# Patient Record
Sex: Female | Born: 1950
Health system: Southern US, Community
[De-identification: ages and names within clinical notes are randomized; demographics above are authoritative.]

## PROBLEM LIST (undated history)

## (undated) DIAGNOSIS — I700 Atherosclerosis of aorta: Secondary | ICD-10-CM

## (undated) DIAGNOSIS — E559 Vitamin D deficiency, unspecified: Secondary | ICD-10-CM

## (undated) DIAGNOSIS — F329 Major depressive disorder, single episode, unspecified: Secondary | ICD-10-CM

## (undated) DIAGNOSIS — M1990 Unspecified osteoarthritis, unspecified site: Secondary | ICD-10-CM

## (undated) DIAGNOSIS — E079 Disorder of thyroid, unspecified: Secondary | ICD-10-CM

## (undated) DIAGNOSIS — E538 Deficiency of other specified B group vitamins: Secondary | ICD-10-CM

## (undated) DIAGNOSIS — F419 Anxiety disorder, unspecified: Secondary | ICD-10-CM

## (undated) DIAGNOSIS — M810 Age-related osteoporosis without current pathological fracture: Secondary | ICD-10-CM

## (undated) DIAGNOSIS — D649 Anemia, unspecified: Secondary | ICD-10-CM

## (undated) DIAGNOSIS — T7840XA Allergy, unspecified, initial encounter: Secondary | ICD-10-CM

## (undated) DIAGNOSIS — I10 Essential (primary) hypertension: Secondary | ICD-10-CM

## (undated) DIAGNOSIS — J439 Emphysema, unspecified: Secondary | ICD-10-CM

## (undated) DIAGNOSIS — K5790 Diverticulosis of intestine, part unspecified, without perforation or abscess without bleeding: Secondary | ICD-10-CM

## (undated) DIAGNOSIS — F1910 Other psychoactive substance abuse, uncomplicated: Secondary | ICD-10-CM

## (undated) DIAGNOSIS — F17200 Nicotine dependence, unspecified, uncomplicated: Secondary | ICD-10-CM

## (undated) DIAGNOSIS — I341 Nonrheumatic mitral (valve) prolapse: Secondary | ICD-10-CM

## (undated) DIAGNOSIS — E785 Hyperlipidemia, unspecified: Secondary | ICD-10-CM

## (undated) DIAGNOSIS — F32A Depression, unspecified: Secondary | ICD-10-CM

## (undated) DIAGNOSIS — I77810 Thoracic aortic ectasia: Secondary | ICD-10-CM

## (undated) DIAGNOSIS — B009 Herpesviral infection, unspecified: Secondary | ICD-10-CM

## (undated) HISTORY — DX: Anxiety disorder, unspecified: F41.9

## (undated) HISTORY — DX: Atherosclerosis of aorta: I70.0

## (undated) HISTORY — DX: Unspecified osteoarthritis, unspecified site: M19.90

## (undated) HISTORY — DX: Thoracic aortic ectasia: I77.810

## (undated) HISTORY — DX: Anemia, unspecified: D64.9

## (undated) HISTORY — DX: Nicotine dependence, unspecified, uncomplicated: F17.200

## (undated) HISTORY — DX: Nonrheumatic mitral (valve) prolapse: I34.1

## (undated) HISTORY — DX: Hyperlipidemia, unspecified: E78.5

## (undated) HISTORY — DX: Essential (primary) hypertension: I10

## (undated) HISTORY — DX: Age-related osteoporosis without current pathological fracture: M81.0

## (undated) HISTORY — DX: Deficiency of other specified B group vitamins: E53.8

## (undated) HISTORY — DX: Other psychoactive substance abuse, uncomplicated: F19.10

## (undated) HISTORY — DX: Herpesviral infection, unspecified: B00.9

## (undated) HISTORY — DX: Major depressive disorder, single episode, unspecified: F32.9

## (undated) HISTORY — DX: Allergy, unspecified, initial encounter: T78.40XA

## (undated) HISTORY — DX: Vitamin D deficiency, unspecified: E55.9

## (undated) HISTORY — PX: TONSILLECTOMY: SUR1361

## (undated) HISTORY — PX: COLONOSCOPY: SHX174

## (undated) HISTORY — DX: Emphysema, unspecified: J43.9

## (undated) HISTORY — PX: OTHER SURGICAL HISTORY: SHX169

## (undated) HISTORY — DX: Diverticulosis of intestine, part unspecified, without perforation or abscess without bleeding: K57.90

## (undated) HISTORY — DX: Disorder of thyroid, unspecified: E07.9

## (undated) HISTORY — DX: Depression, unspecified: F32.A

---

## 1998-12-21 HISTORY — PX: VARICOSE VEIN SURGERY: SHX832

## 2010-10-13 ENCOUNTER — Ambulatory Visit (HOSPITAL_COMMUNITY): Admission: RE | Admit: 2010-10-13 | Discharge: 2010-10-13 | Payer: Self-pay | Admitting: Family Medicine

## 2010-11-10 ENCOUNTER — Encounter: Admission: RE | Admit: 2010-11-10 | Discharge: 2010-11-10 | Payer: Self-pay | Admitting: Family Medicine

## 2011-07-06 ENCOUNTER — Other Ambulatory Visit: Payer: Self-pay | Admitting: Emergency Medicine

## 2011-07-06 DIAGNOSIS — R1032 Left lower quadrant pain: Secondary | ICD-10-CM

## 2011-07-10 ENCOUNTER — Ambulatory Visit
Admission: RE | Admit: 2011-07-10 | Discharge: 2011-07-10 | Disposition: A | Discharge status: Home / Self Care | Payer: BC Managed Care – PPO | Source: Ambulatory Visit | Attending: Emergency Medicine | Admitting: Emergency Medicine

## 2011-07-10 DIAGNOSIS — R1032 Left lower quadrant pain: Secondary | ICD-10-CM

## 2011-07-10 MED ORDER — IOHEXOL 300 MG/ML  SOLN
100.0000 mL | Freq: Once | INTRAMUSCULAR | Status: AC | PRN
  Administered 2011-07-10: 100 mL via INTRAVENOUS

## 2011-08-21 ENCOUNTER — Other Ambulatory Visit: Payer: Self-pay | Admitting: Emergency Medicine

## 2011-08-25 ENCOUNTER — Other Ambulatory Visit: Payer: Self-pay | Admitting: Emergency Medicine

## 2011-08-25 DIAGNOSIS — R911 Solitary pulmonary nodule: Secondary | ICD-10-CM

## 2011-08-28 ENCOUNTER — Ambulatory Visit (HOSPITAL_COMMUNITY): Admission: RE | Admit: 2011-08-28 | Payer: BC Managed Care – PPO | Source: Ambulatory Visit

## 2011-09-28 ENCOUNTER — Other Ambulatory Visit: Payer: Self-pay | Admitting: Family Medicine

## 2011-09-28 DIAGNOSIS — R911 Solitary pulmonary nodule: Secondary | ICD-10-CM

## 2011-09-29 ENCOUNTER — Other Ambulatory Visit: Payer: Self-pay | Admitting: Family Medicine

## 2011-09-29 DIAGNOSIS — R634 Abnormal weight loss: Secondary | ICD-10-CM

## 2011-09-29 DIAGNOSIS — R1900 Intra-abdominal and pelvic swelling, mass and lump, unspecified site: Secondary | ICD-10-CM

## 2011-10-01 ENCOUNTER — Other Ambulatory Visit (HOSPITAL_COMMUNITY): Payer: BC Managed Care – PPO

## 2011-10-09 ENCOUNTER — Ambulatory Visit
Admission: RE | Admit: 2011-10-09 | Discharge: 2011-10-09 | Disposition: A | Discharge status: Home / Self Care | Payer: BC Managed Care – PPO | Source: Ambulatory Visit | Attending: Family Medicine | Admitting: Family Medicine

## 2011-10-09 DIAGNOSIS — R1900 Intra-abdominal and pelvic swelling, mass and lump, unspecified site: Secondary | ICD-10-CM

## 2011-10-09 DIAGNOSIS — R634 Abnormal weight loss: Secondary | ICD-10-CM

## 2011-10-20 ENCOUNTER — Ambulatory Visit (HOSPITAL_COMMUNITY)
Admission: RE | Admit: 2011-10-20 | Discharge: 2011-10-20 | Disposition: A | Discharge status: Home / Self Care | Payer: BC Managed Care – PPO | Source: Ambulatory Visit | Attending: Family Medicine | Admitting: Family Medicine

## 2011-10-20 DIAGNOSIS — J984 Other disorders of lung: Secondary | ICD-10-CM | POA: Insufficient documentation

## 2011-10-20 DIAGNOSIS — R918 Other nonspecific abnormal finding of lung field: Secondary | ICD-10-CM | POA: Insufficient documentation

## 2011-10-20 DIAGNOSIS — R911 Solitary pulmonary nodule: Secondary | ICD-10-CM

## 2011-10-20 DIAGNOSIS — R109 Unspecified abdominal pain: Secondary | ICD-10-CM | POA: Insufficient documentation

## 2011-10-20 DIAGNOSIS — R634 Abnormal weight loss: Secondary | ICD-10-CM | POA: Insufficient documentation

## 2011-12-22 ENCOUNTER — Ambulatory Visit (INDEPENDENT_AMBULATORY_CARE_PROVIDER_SITE_OTHER): Payer: BC Managed Care – PPO

## 2011-12-22 DIAGNOSIS — J019 Acute sinusitis, unspecified: Secondary | ICD-10-CM

## 2011-12-22 DIAGNOSIS — J209 Acute bronchitis, unspecified: Secondary | ICD-10-CM

## 2011-12-22 DIAGNOSIS — F17200 Nicotine dependence, unspecified, uncomplicated: Secondary | ICD-10-CM

## 2012-02-16 ENCOUNTER — Other Ambulatory Visit: Payer: Self-pay | Admitting: Physician Assistant

## 2012-02-17 ENCOUNTER — Ambulatory Visit (INDEPENDENT_AMBULATORY_CARE_PROVIDER_SITE_OTHER): Payer: BC Managed Care – PPO | Admitting: Family Medicine

## 2012-02-17 DIAGNOSIS — R5381 Other malaise: Secondary | ICD-10-CM

## 2012-02-17 DIAGNOSIS — E039 Hypothyroidism, unspecified: Secondary | ICD-10-CM | POA: Insufficient documentation

## 2012-02-17 DIAGNOSIS — M2550 Pain in unspecified joint: Secondary | ICD-10-CM

## 2012-02-17 DIAGNOSIS — R5383 Other fatigue: Secondary | ICD-10-CM

## 2012-02-17 DIAGNOSIS — Z8719 Personal history of other diseases of the digestive system: Secondary | ICD-10-CM

## 2012-02-17 LAB — POCT CBC
Granulocyte percent: 52.3 %G (ref 37–80)
HCT, POC: 38.2 % (ref 37.7–47.9)
Hemoglobin: 12.3 g/dL (ref 12.2–16.2)
Lymph, poc: 1.8 (ref 0.6–3.4)
MCH, POC: 31.3 pg — AB (ref 27–31.2)
MCHC: 32.2 g/dL (ref 31.8–35.4)
MCV: 97.1 fL — AB (ref 80–97)
MID (cbc): 0.2 (ref 0–0.9)
MPV: 8.6 fL (ref 0–99.8)
POC Granulocyte: 2.2 (ref 2–6.9)
POC LYMPH PERCENT: 43.1 %L (ref 10–50)
POC MID %: 4.6 %M (ref 0–12)
Platelet Count, POC: 208 10*3/uL (ref 142–424)
RBC: 3.93 M/uL — AB (ref 4.04–5.48)
RDW, POC: 13.9 %
WBC: 4.2 10*3/uL — AB (ref 4.6–10.2)

## 2012-02-17 LAB — POCT SEDIMENTATION RATE: POCT SED RATE: 8 mm/hr (ref 0–22)

## 2012-02-17 NOTE — Progress Notes (Signed)
61 yo with malaise x 3 months.  Feels depressed.  Has been caring for sister since she had surgery, although sister is in assisted living.  Her sister has Parkinson's, dementia and now the knee replacement surgery.  Father and an aunt has Parkinson's. Sleeping well on Trazadone 25 mg qhs.(she cut back from 50 1 year ago), Paxil 20 daily, levothyroxine 50 mcg Joints are painful in hands, and bones ache. No fever, chills Works around children. No s/t, cough, urinary sx, abdominal sx  O:  NAD, alert with very fine, faint action tremor. HEENT:  Unremarkable Chest:  Clear Heart:  Reg, no murmur, no gallop Abd:  Soft, nontender, no masses or HSM Ext:  No obvious arthritic changes, good pulses  A:  Multiple somatic symptoms which could be explained by thyroid disease., polymyalgia, burn-out from caretaker role  P:  Will get some therapy in near future. Check sed rate, CMET, TSH, CBC

## 2012-02-18 LAB — TSH: TSH: 1.324 u[IU]/mL (ref 0.350–4.500)

## 2012-02-18 LAB — COMPREHENSIVE METABOLIC PANEL
ALT: 14 U/L (ref 0–35)
AST: 20 U/L (ref 0–37)
Albumin: 4.8 g/dL (ref 3.5–5.2)
Alkaline Phosphatase: 62 U/L (ref 39–117)
BUN: 15 mg/dL (ref 6–23)
CO2: 31 mEq/L (ref 19–32)
Calcium: 9.1 mg/dL (ref 8.4–10.5)
Chloride: 102 mEq/L (ref 96–112)
Creat: 0.63 mg/dL (ref 0.50–1.10)
Glucose, Bld: 85 mg/dL (ref 70–99)
Potassium: 4.1 mEq/L (ref 3.5–5.3)
Sodium: 139 mEq/L (ref 135–145)
Total Bilirubin: 0.7 mg/dL (ref 0.3–1.2)
Total Protein: 6.8 g/dL (ref 6.0–8.3)

## 2012-02-22 ENCOUNTER — Ambulatory Visit (INDEPENDENT_AMBULATORY_CARE_PROVIDER_SITE_OTHER): Payer: BC Managed Care – PPO | Admitting: Family Medicine

## 2012-02-22 VITALS — BP 117/73 | HR 66 | Temp 98.4°F | Resp 18 | Ht 66.0 in | Wt 112.8 lb

## 2012-02-22 DIAGNOSIS — Z0000 Encounter for general adult medical examination without abnormal findings: Secondary | ICD-10-CM

## 2012-02-22 DIAGNOSIS — R5381 Other malaise: Secondary | ICD-10-CM

## 2012-02-22 DIAGNOSIS — R5383 Other fatigue: Secondary | ICD-10-CM

## 2012-02-22 LAB — POCT URINALYSIS DIPSTICK
Bilirubin, UA: NEGATIVE
Blood, UA: NEGATIVE
Glucose, UA: NEGATIVE
Ketones, UA: NEGATIVE
Leukocytes, UA: NEGATIVE
Nitrite, UA: NEGATIVE
Protein, UA: NEGATIVE
Spec Grav, UA: 1.015
Urobilinogen, UA: 0.2
pH, UA: 5.5

## 2012-02-22 LAB — POCT UA - MICROSCOPIC ONLY
Bacteria, U Microscopic: NEGATIVE
Casts, Ur, LPF, POC: NEGATIVE
Crystals, Ur, HPF, POC: NEGATIVE
Mucus, UA: NEGATIVE
RBC, urine, microscopic: NEGATIVE
Yeast, UA: NEGATIVE

## 2012-02-22 LAB — LIPID PANEL
Cholesterol: 185 mg/dL (ref 0–200)
HDL: 80 mg/dL (ref >39)
LDL Cholesterol: 95 mg/dL (ref 0–99)
Total CHOL/HDL Ratio: 2.3 Ratio
Triglycerides: 48 mg/dL (ref <150)
VLDL: 10 mg/dL (ref 0–40)

## 2012-02-22 LAB — VITAMIN B12: Vitamin B-12: 241 pg/mL (ref 211–911)

## 2012-02-22 NOTE — Progress Notes (Signed)
[facility]:  Office Visit  Chief Complaint:  Chief Complaint  Patient presents with  . Annual Exam    HPI: Kristine Davidson is a 61 y.o. female who is here for an annual exam. She recently had labs done and has never had an abnormal pap. The labs were done for generalized fatigue. She is stressed because taking care of sister with Parkinsons. Lab work listed below done by Dr. Elbert Ewings were all normal.   Past Medical History  Diagnosis Date  . Depression   . Mitral valve prolapse   . Anemia   . Diverticular disease   . Thyroid disease    No past surgical history on file. History   Social History  . Marital Status: Divorced    Spouse Name: N/A    Number of Children: N/A  . Years of Education: N/A   Social History Main Topics  . Smoking status: Current Everyday Smoker -- 10.0 packs/day for 43 years    Types: Cigarettes  . Smokeless tobacco: None  . Alcohol Use: None  . Drug Use: None  . Sexually Active: None   Other Topics Concern  . None   Social History Narrative  . None   No family history on file. Allergies  Allergen Reactions  . Codeine Itching   Prior to Admission medications   Medication Sig Start Date End Date Taking? Authorizing Provider  levothyroxine (SYNTHROID, LEVOTHROID) 50 MCG tablet Take 50 mcg by mouth daily.   Yes Historical Provider, MD  PARoxetine (PAXIL) 20 MG tablet Take 20 mg by mouth every morning.   Yes Historical Provider, MD  traZODone (DESYREL) 50 MG tablet TAKE ONE TABLET BY MOUTH AT BEDTIME 02/16/12  Yes Ryan Dunn, PA-C  valACYclovir (VALTREX) 1000 MG tablet Take 1,000 mg by mouth once.   Yes Historical Provider, MD     ROS: The patient denies fevers, chills, night sweats, unintentional weight loss, chest pain, palpitations, wheezing, dyspnea on exertion, nausea, vomiting, abdominal pain, dysuria, hematuria, melena, numbness,  or tingling. + fatigue  All other systems have been reviewed and were otherwise negative with  the exception of those mentioned in the HPI and as above.    PHYSICAL EXAM: Filed Vitals:   02/22/12 1009  BP: 117/73  Pulse: 66  Temp: 98.4 F (36.9 C)  Resp: 18   Filed Vitals:   02/22/12 1009  Height: 5\' 6"  (1.676 m)  Weight: 112 lb 12.8 oz (51.166 kg)   Body mass index is 18.21 kg/(m^2).  General: Alert, no acute distress HEENT:  Normocephalic, atraumatic, oropharynx patent. TM normal. Cardiovascular:  Regular rate and rhythm, no rubs murmurs or gallops.  No Carotid bruits, radial pulse intact. No pedal edema.  Respiratory: Clear to auscultation bilaterally.  No wheezes, rales, or rhonchi.  No cyanosis, no use of accessory musculature GI: No organomegaly, abdomen is soft and non-tender, positive bowel sounds.  No masses. Skin: No rashes. Neurologic: Facial musculature symmetric. Psychiatric: Patient is appropriate throughout our interaction. Lymphatic: No cervical lymphadenopathy Musculoskeletal: Gait intact. Normal Pelvic and Breast Exam.  LABS: Results for orders placed in visit on 02/17/12  POCT CBC      Component Value Range   WBC 4.2 (*) 4.6 - 10.2 (K/uL)   Lymph, poc 1.8  0.6 - 3.4    POC LYMPH PERCENT 43.1  10 - 50 (%L)   MID (cbc) 0.2  0 - 0.9    POC MID % 4.6  0 - 12 (%M)  POC Granulocyte 2.2  2 - 6.9    Granulocyte percent 52.3  37 - 80 (%G)   RBC 3.93 (*) 4.04 - 5.48 (M/uL)   Hemoglobin 12.3  12.2 - 16.2 (g/dL)   HCT, POC 40.9  81.1 - 47.9 (%)   MCV 97.1 (*) 80 - 97 (fL)   MCH, POC 31.3 (*) 27 - 31.2 (pg)   MCHC 32.2  31.8 - 35.4 (g/dL)   RDW, POC 91.4     Platelet Count, POC 208  142 - 424 (K/uL)   MPV 8.6  0 - 99.8 (fL)  POCT SEDIMENTATION RATE      Component Value Range   POCT SED RATE 8  0 - 22 (mm/hr)  COMPREHENSIVE METABOLIC PANEL      Component Value Range   Sodium 139  135 - 145 (mEq/L)   Potassium 4.1  3.5 - 5.3 (mEq/L)   Chloride 102  96 - 112 (mEq/L)   CO2 31  19 - 32 (mEq/L)   Glucose, Bld 85  70 - 99 (mg/dL)   BUN 15  6 - 23  (mg/dL)   Creat 7.82  9.56 - 2.13 (mg/dL)   Total Bilirubin 0.7  0.3 - 1.2 (mg/dL)   Alkaline Phosphatase 62  39 - 117 (U/L)   AST 20  0 - 37 (U/L)   ALT 14  0 - 35 (U/L)   Total Protein 6.8  6.0 - 8.3 (g/dL)   Albumin 4.8  3.5 - 5.2 (g/dL)   Calcium 9.1  8.4 - 08.6 (mg/dL)  TSH      Component Value Range   TSH 1.324  0.350 - 4.500 (uIU/mL)     EKG/XRAY:   Primary read interpreted by Dr. Conley Rolls at [facility].   ASSESSMENT/PLAN: Encounter Diagnoses  Name Primary?  . Annual physical exam Yes  . Fatigue    Patient doing well overall. She has ahd some signifiant medical stressors in her life specifically taking care of sister with Parkinsons in addition to working. This may be contributing to her fatigue.  Needs mammogram referral. At some point will need Bone density scan Lipids, Vita D and B12, UA.  Recent TSH, CBC, CMP all normal    Kristine Wishon PHUONG, DO 02/22/2012 12:03 PM

## 2012-02-23 LAB — VITAMIN D 25 HYDROXY (VIT D DEFICIENCY, FRACTURES): Vit D, 25-Hydroxy: 30 ng/mL (ref 30–89)

## 2012-02-27 ENCOUNTER — Telehealth: Payer: Self-pay | Admitting: Family Medicine

## 2012-02-27 NOTE — Telephone Encounter (Signed)
Spoke to patietn about labs. Low nl B12 and Vit D. Recommend OTC supplement for those 2 things, may help with fatigue. She has a form that she wants Dr. Elbert Ewings to fill out and dropped by last week regarding retroactive PE and TB test. 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 Told her to call on MOdnay or Tuesday if daycare has not received.

## 2012-03-01 ENCOUNTER — Telehealth: Payer: Self-pay

## 2012-03-17 ENCOUNTER — Other Ambulatory Visit: Payer: Self-pay | Admitting: Family Medicine

## 2012-03-17 ENCOUNTER — Ambulatory Visit (HOSPITAL_COMMUNITY)
Admission: RE | Admit: 2012-03-17 | Discharge: 2012-03-17 | Disposition: A | Discharge status: Home / Self Care | Payer: BC Managed Care – PPO | Source: Ambulatory Visit | Attending: Family Medicine | Admitting: Family Medicine

## 2012-03-17 DIAGNOSIS — Z1231 Encounter for screening mammogram for malignant neoplasm of breast: Secondary | ICD-10-CM | POA: Insufficient documentation

## 2012-03-17 DIAGNOSIS — Z0000 Encounter for general adult medical examination without abnormal findings: Secondary | ICD-10-CM

## 2012-03-31 ENCOUNTER — Ambulatory Visit: Payer: BC Managed Care – PPO

## 2012-03-31 ENCOUNTER — Ambulatory Visit (INDEPENDENT_AMBULATORY_CARE_PROVIDER_SITE_OTHER): Payer: BC Managed Care – PPO | Admitting: Internal Medicine

## 2012-03-31 VITALS — BP 95/61 | HR 70 | Temp 98.1°F | Resp 16 | Ht 66.0 in | Wt 110.2 lb

## 2012-03-31 DIAGNOSIS — B009 Herpesviral infection, unspecified: Secondary | ICD-10-CM

## 2012-03-31 DIAGNOSIS — D649 Anemia, unspecified: Secondary | ICD-10-CM

## 2012-03-31 DIAGNOSIS — E538 Deficiency of other specified B group vitamins: Secondary | ICD-10-CM

## 2012-03-31 DIAGNOSIS — R509 Fever, unspecified: Secondary | ICD-10-CM

## 2012-03-31 DIAGNOSIS — E559 Vitamin D deficiency, unspecified: Secondary | ICD-10-CM | POA: Insufficient documentation

## 2012-03-31 DIAGNOSIS — R059 Cough, unspecified: Secondary | ICD-10-CM

## 2012-03-31 DIAGNOSIS — R05 Cough: Secondary | ICD-10-CM

## 2012-03-31 DIAGNOSIS — J40 Bronchitis, not specified as acute or chronic: Secondary | ICD-10-CM

## 2012-03-31 HISTORY — DX: Deficiency of other specified B group vitamins: E53.8

## 2012-03-31 LAB — POCT CBC
Granulocyte percent: 64.9 %G (ref 37–80)
HCT, POC: 32.9 % — AB (ref 37.7–47.9)
Hemoglobin: 10.9 g/dL — AB (ref 12.2–16.2)
Lymph, poc: 2.1 (ref 0.6–3.4)
MCHC: 33.1 g/dL (ref 31.8–35.4)
MPV: 8.8 fL (ref 0–99.8)
POC Granulocyte: 4.8 (ref 2–6.9)
RBC: 3.42 M/uL — AB (ref 4.04–5.48)

## 2012-03-31 MED ORDER — HYDROCODONE-ACETAMINOPHEN 7.5-500 MG/15ML PO SOLN: 5.0000 mL | Freq: Four times a day (QID) | ORAL | Status: AC | PRN

## 2012-03-31 MED ORDER — AZITHROMYCIN 500 MG PO TABS: 500.0000 mg | ORAL_TABLET | Freq: Every day | ORAL | Status: AC

## 2012-03-31 NOTE — Progress Notes (Signed)
  Subjective:    Patient ID: Kristine Davidson, female    DOB: 03-14-1951, 61 y.o.   MRN: [id number]  HPI Has fever , cough, sweats for over 1 week. Sputum and discharge green. Mild chest pain   Review of Systems HSV2, Hypothyroid, mood issues    Objective:   Physical Exam  Constitutional: She is oriented to person, place, and time. She appears well-developed and well-nourished.  HENT:  Right Ear: Hearing, tympanic membrane, external ear and ear canal normal.  Left Ear: Hearing, tympanic membrane, external ear and ear canal normal.  Nose: Mucosal edema, rhinorrhea and sinus tenderness present. Right sinus exhibits no maxillary sinus tenderness and no frontal sinus tenderness. Left sinus exhibits no maxillary sinus tenderness and no frontal sinus tenderness.  Mouth/Throat: Oropharynx is clear and moist and mucous membranes are normal. No oropharyngeal exudate.  Neck: Neck supple.  Pulmonary/Chest: Effort normal. No respiratory distress. She has rales. She exhibits tenderness.  Neurological: She is alert and oriented to person, place, and time. No cranial nerve deficit. Coordination normal.  Skin: Skin is warm and dry.  Psychiatric: She has a normal mood and affect.    [facility] reading (PRIMARY) by  Dr.Yahye Siebert NAD. Results for orders placed in visit on 03/31/12  POCT CBC      Component Value Range   WBC 7.4  4.6 - 10.2 (K/uL)   Lymph, poc 2.1  0.6 - 3.4    POC LYMPH PERCENT 28.0  10 - 50 (%L)   MID (cbc) 0.5  0 - 0.9    POC MID % 7.1  0 - 12 (%M)   POC Granulocyte 4.8  2 - 6.9    Granulocyte percent 64.9  37 - 80 (%G)   RBC 3.42 (*) 4.04 - 5.48 (M/uL)   Hemoglobin 10.9 (*) 12.2 - 16.2 (g/dL)   HCT, POC 78.2 (*) 95.6 - 47.9 (%)   MCV 96.2  80 - 97 (fL)   MCH, POC 31.9 (*) 27 - 31.2 (pg)   MCHC 33.1  31.8 - 35.4 (g/dL)   RDW, POC 21.3     Platelet Count, POC 274  142 - 424 (K/uL)   MPV 8.8  0 - 99.8 (fL)          Assessment & Plan:  B12 and Vitamin D deficiency ck labs after  oral therapy Anemia cause unclear Labs done f/up with Kristine Davidson w/in 1 month.  Bronchitis and sinusitis  See rxs

## 2012-03-31 NOTE — Patient Instructions (Signed)

## 2012-04-01 LAB — IBC PANEL
%SAT: 14 % — ABNORMAL LOW (ref 20–55)
TIBC: 274 ug/dL (ref 250–470)
UIBC: 236 ug/dL (ref 125–400)

## 2012-04-01 LAB — VITAMIN D 25 HYDROXY (VIT D DEFICIENCY, FRACTURES): Vit D, 25-Hydroxy: 76 ng/mL (ref 30–89)

## 2012-04-01 LAB — VITAMIN B12: Vitamin B-12: >2000 pg/mL — ABNORMAL HIGH (ref 211–911)

## 2012-04-02 ENCOUNTER — Other Ambulatory Visit: Payer: Self-pay | Admitting: Family Medicine

## 2012-04-06 ENCOUNTER — Ambulatory Visit (INDEPENDENT_AMBULATORY_CARE_PROVIDER_SITE_OTHER): Payer: BC Managed Care – PPO | Admitting: Family Medicine

## 2012-04-06 VITALS — BP 101/68 | HR 81 | Temp 100.0°F | Resp 16 | Ht 65.75 in | Wt 111.6 lb

## 2012-04-06 DIAGNOSIS — R059 Cough, unspecified: Secondary | ICD-10-CM

## 2012-04-06 DIAGNOSIS — R05 Cough: Secondary | ICD-10-CM

## 2012-04-06 DIAGNOSIS — R61 Generalized hyperhidrosis: Secondary | ICD-10-CM

## 2012-04-06 DIAGNOSIS — D649 Anemia, unspecified: Secondary | ICD-10-CM

## 2012-04-06 LAB — CBC WITH DIFFERENTIAL/PLATELET
Basophils Absolute: 0 10*3/uL (ref 0.0–0.1)
Basophils Relative: 0 % (ref 0–1)
Eosinophils Absolute: 0 10*3/uL (ref 0.0–0.7)
Eosinophils Relative: 0 % (ref 0–5)
HCT: 31 % — ABNORMAL LOW (ref 36.0–46.0)
Hemoglobin: 10.6 g/dL — ABNORMAL LOW (ref 12.0–15.0)
Lymphocytes Relative: 11 % — ABNORMAL LOW (ref 12–46)
Lymphs Abs: 1.4 10*3/uL (ref 0.7–4.0)
MCH: 31.9 pg (ref 26.0–34.0)
MCHC: 34.2 g/dL (ref 30.0–36.0)
MCV: 93.4 fL (ref 78.0–100.0)
Monocytes Absolute: 0.6 10*3/uL (ref 0.1–1.0)
Monocytes Relative: 5 % (ref 3–12)
Neutro Abs: 10.2 10*3/uL — ABNORMAL HIGH (ref 1.7–7.7)
Neutrophils Relative %: 83 % — ABNORMAL HIGH (ref 43–77)
Platelets: 330 10*3/uL (ref 150–400)
RBC: 3.32 MIL/uL — ABNORMAL LOW (ref 3.87–5.11)
RDW: 12.4 % (ref 11.5–15.5)
WBC: 12.3 10*3/uL — ABNORMAL HIGH (ref 4.0–10.5)

## 2012-04-06 LAB — RETICULOCYTES
ABS Retic: 46.5 10*3/uL (ref 19.0–186.0)
RBC.: 3.32 MIL/uL — ABNORMAL LOW (ref 3.87–5.11)
Retic Ct Pct: 1.4 % (ref 0.4–2.3)

## 2012-04-06 NOTE — Patient Instructions (Signed)
Anemia, Nonspecific Your exam and blood tests show you are anemic. This means your blood (hemoglobin) level is low. Normal hemoglobin values are 12 to 15 g/dL for females and 14 to 17 g/dL for males. Make a note of your hemoglobin level today. The hematocrit percent is also used to measure anemia. A normal hematocrit is 38% to 46% in females and 42% to 49% in males. Make a note of your hematocrit level today. CAUSES  Anemia can be due to many different causes.  Excessive bleeding from periods (in women).   Intestinal bleeding.   Poor nutrition.   Kidney, thyroid, liver, and bone marrow diseases.  SYMPTOMS  Anemia can come on suddenly (acute). It can also come on slowly. Symptoms can include:  Minor weakness.   Dizziness.   Palpitations.   Shortness of breath.  Symptoms may be absent until half your hemoglobin is missing if it comes on slowly. Anemia due to acute blood loss from an injury or internal bleeding may require blood transfusion if the loss is severe. Hospital care is needed if you are anemic and there is significant continual blood loss. TREATMENT   Stool tests for blood (Hemoccult) and additional lab tests are often needed. This determines the best treatment.   Further checking on your condition and your response to treatment is very important. It often takes many weeks to correct anemia.  Depending on the cause, treatment can include:  Supplements of iron.   Vitamins B12 and folic acid.   Hormone medicines.If your anemia is due to bleeding, finding the cause of the blood loss is very important. This will help avoid further problems.  SEEK IMMEDIATE MEDICAL CARE IF:   You develop fainting, extreme weakness, shortness of breath, or chest pain.   You develop heavy vaginal bleeding.   You develop bloody or black, tarry stools or vomit up blood.   You develop a high fever, rash, repeated vomiting, or dehydration.  Document Released: 01/14/2005 Document Revised:  11/26/2011 Document Reviewed: 10/22/2009 Exit[employer] Patient Information 2012 [employer]. 

## 2012-04-06 NOTE — Progress Notes (Signed)
S:  61 yo woman with recent cough, fatigue and anemia.  She tested her carbon monoxide in her house which was negative.  Fatigue is lifting.  She saw Vonna Kotyk for the depression.  She takes the Paxil chronically.  Overall, feeling much better.  Has started B vitamins and in 5 days felt much better.  Sleeping better.  On chronic trazadone since 2006 when she got sober.  Still coughing some.  Took another 5 days of Azithromycin 500 mg.  Cough is occasionally productive of greenish thick phlegm.  No SOB or chest pain.  She does have drenching night sweats for the past month.  Last TB test 1 year ago.  Has h/o alcoholism.  O: NAD HEENT:  Swollen nasal passages with redness, TM's good, throat looks normal Neck: no adenopathy Chest:  ronchi Heart:  Reg without murmur Skin: sparsely Scattered erythematous papules on anterior chest Results for orders placed in visit on 03/31/12  POCT CBC      Component Value Range   WBC 7.4  4.6 - 10.2 (K/uL)   Lymph, poc 2.1  0.6 - 3.4    POC LYMPH PERCENT 28.0  10 - 50 (%L)   MID (cbc) 0.5  0 - 0.9    POC MID % 7.1  0 - 12 (%M)   POC Granulocyte 4.8  2 - 6.9    Granulocyte percent 64.9  37 - 80 (%G)   RBC 3.42 (*) 4.04 - 5.48 (M/uL)   Hemoglobin 10.9 (*) 12.2 - 16.2 (g/dL)   HCT, POC 09.8 (*) 11.9 - 47.9 (%)   MCV 96.2  80 - 97 (fL)   MCH, POC 31.9 (*) 27 - 31.2 (pg)   MCHC 33.1  31.8 - 35.4 (g/dL)   RDW, POC 14.7     Platelet Count, POC 274  142 - 424 (K/uL)   MPV 8.8  0 - 99.8 (fL)  VITAMIN B12      Component Value Range   Vitamin B-12 >2000 (*) 211 - 911 (pg/mL)  VITAMIN D 25 HYDROXY      Component Value Range   Vit D, 25-Hydroxy 76  30 - 89 (ng/mL)  IBC PANEL      Component Value Range   UIBC 236  125 - 400 (ug/dL)   TIBC 829  562 - 130 (ug/dL)   %SAT 14 (*) 20 - 55 (%)  IRON      Component Value Range   Iron 38 (*) 42 - 145 (ug/dL)   A:  Gradually resolving symptoms Uncertain cause of the anemia The night sweats is of concern,  especially with respect to the cough and her frail body habitus and h/o alcohol use.  P:  Recheck CBC Reticulocyte count  TB skin test

## 2012-04-08 ENCOUNTER — Other Ambulatory Visit: Payer: Self-pay | Admitting: Family Medicine

## 2012-04-08 ENCOUNTER — Telehealth: Payer: Self-pay

## 2012-04-08 DIAGNOSIS — D649 Anemia, unspecified: Secondary | ICD-10-CM

## 2012-04-08 NOTE — Telephone Encounter (Signed)
Pt returned our call about her labs at 6:10 pm 4/19

## 2012-04-09 ENCOUNTER — Ambulatory Visit (INDEPENDENT_AMBULATORY_CARE_PROVIDER_SITE_OTHER): Payer: BC Managed Care – PPO | Admitting: Family Medicine

## 2012-04-09 ENCOUNTER — Encounter: Payer: Self-pay | Admitting: Family Medicine

## 2012-04-09 VITALS — BP 97/64 | HR 75 | Temp 98.2°F | Resp 16 | Ht 66.0 in | Wt 111.2 lb

## 2012-04-09 DIAGNOSIS — D649 Anemia, unspecified: Secondary | ICD-10-CM

## 2012-04-09 DIAGNOSIS — R61 Generalized hyperhidrosis: Secondary | ICD-10-CM

## 2012-04-09 DIAGNOSIS — R059 Cough, unspecified: Secondary | ICD-10-CM

## 2012-04-09 DIAGNOSIS — R05 Cough: Secondary | ICD-10-CM

## 2012-04-09 LAB — TB SKIN TEST
Induration: 0
TB Skin Test: NEGATIVE mm

## 2012-04-09 LAB — ANGIOTENSIN CONVERTING ENZYME

## 2012-04-09 NOTE — Patient Instructions (Signed)
Sarcoidosis, Schaumann's Disease, Sarcoid of [place] Sarcoidosis appears briefly and heals naturally in 60 to 70 percent of cases, often without the patient knowing or doing anything about it. 20 to 30 percent of patients with sarcoidosis are left with some permanent lung damage. In 10 to 15 percent of the patients, sarcoidosis can become chronic (long lasting). When either the granulomas or fibrosis seriously affect the function of a vital organ (lungs, heart, nervous system, liver, or kidneys), sarcoidosis can be fatal. This occurs 5 to 10 percent of the time. No one can predict how sarcoidosis will progress in an individual patient. The symptoms the patient experiences, the caregiver's findings, and the patient's race can give some clues. Sarcoidosis was once considered a rare disease. We now know that it is a common chronic illness that appears all over the world. It is the most common of the fibrotic (scarring) lung disorders. Anyone can get sarcoidosis. It occurs in all races and in both sexes. The risk is greater if you are a young black adult, especially a black woman, or are of Scandinavian, German, Irish, or Puerto [place] origin. In sarcoidosis, small lumps (also called nodules or granulomas) develop in multiple organs of the body. These granulomas are small collections of inflamed cells. They commonly appear in the lungs. This is the most common organ affected. They also occur in the lymph nodes (your glands), skin, liver, and eyes. The granulomas vary in the amount of disease they produce from very little with no problems (symptoms) to causing severe illness. The cause of sarcoidosis is not known. It may be due to an abnormal immune reaction in the body. Most people will recover. A few people will develop long lasting conditions that may get worse. Women are affected more often than men. The majority of those affected are under forty years of age. Because we do not know the cause, we do not have ways to  prevent it. SYMPTOMS   Fever.   Loss of appetite.   Night sweats.   Joint pain.   Aching muscles  Symptoms vary because the disease affects different parts of the body in different people. Most people who see their caregiver with sarcoidosis have lung problems. The first signs are usually a dry cough and shortness of breath. There may also be wheezing, chest pain, or a cough that brings up bloody mucus. In severe cases, lung function may become so poor that the person cannot perform even the simple routine tasks of daily life. Other symptoms of sarcoidosis are less common than lung symptoms. They can include:  Skin symptoms. Sarcoidosis can appear as a collection of tender, red bumps called erythema nodosum. These bumps usually occur on the face, shins, and arms. They can also occur as a scaly, purplish discoloration on the nose, cheeks, and ears. This is called lupus pernio. Less often, sarcoidosis causes cysts, pimples, or disfiguring over growths of skin. In many cases, the disfiguring over growths develop in areas of scars or tattoos.   Eye symptoms. These include redness, eye pain, and sensitivity to light.   Heart symptoms. These include irregular heartbeat and heart failure.   Other symptoms. A person may have paralyzed facial muscles, seizures, psychiatric symptoms, swollen salivary glands, or bone pain.  DIAGNOSIS  Even when there are no symptoms, your caregiver can sometimes pick up signs of sarcoidosis during a routine examination, usually through a chest x-ray or when checking other complaints. The patient's age and race or ethnic group can raise an   additional red flag that a sign or symptom could be related to sarcoidosis.   Enlargement of the salivary or tear glands and cysts in bone tissue may also be caused by sarcoidosis.   You may have had a biopsy done that shows signs of sarcoidosis. A biopsy is a small tissue sample that is removed for laboratory testing. This tissue  sample can be taken from your lung, skin, lip, or another inflamed or abnormal area of the body.   You may have had an abnormal chest X-ray. Although you appear healthy, a chest X-ray ordered for other reasons may turn up abnormalities that suggest sarcoidosis.   Other tests may be needed. These tests may be done to rule out other illnesses or to determine the amount of organ damage caused by sarcoidosis. Some of the most common tests are:   Blood levels of calcium or angiotensin-converting enzyme may be high in people with sarcoidosis.   Blood tests to evaluate how well your liver is functioning.   Lung function tests to measure how well you are breathing.   A complete eye examination.  TREATMENT  If sarcoidosis does not cause any problems, treatment may not be necessary. Your caregiver may decide to simply monitor your condition. As part of this monitoring process, you may have frequent office visits, follow-up chest X-rays, and tests of your lung function.If you have signs of moderate or severe lung disease, your doctor may recommend:  A corticosteroid drug, such as prednisone (sold under several brand names).   Corticosteroids also are used to treat sarcoidosis of the eyes, joints, skin, nerves, or heart.   Corticosteroid eye drops may be used for the eyes.   Over-the-counter medications like nonsteroidal anti-inflammatory drugs (NSAID) often are used to treat joint pain first before corticosteroids, which tend to have more side effects.   If corticosteroids are not effective or cause serious side effects, other drugs that alter or suppress the immune system may be used.   In rare cases, when sarcoidosis causes life-threatening lung disease, a lung transplant may be necessary. However, there is some risk that the new lungs also will be attacked by sarcoidosis.  SEEK IMMEDIATE MEDICAL CARE IF:   You suffer from shortness of breath or a lingering cough.   You develop new problems  that may be related to the disease. Remember this disease can affect almost all organs of the body and cause many different problems.  Document Released: 10/07/2004 Document Revised: 11/26/2011 Document Reviewed: 03/17/2006 Exit[employer] Patient Information 2012 [employer]. 

## 2012-04-09 NOTE — Progress Notes (Deleted)
1 

## 2012-04-09 NOTE — Progress Notes (Signed)
S: 61 yo woman with recent cough, fatigue and anemia. She tested her carbon monoxide in her house which was negative. Fatigue is lifting.  She saw Vonna Kotyk for the depression. She takes the Paxil chronically. Overall, feeling much better. Has started B vitamins and in 5 days felt much better. Sleeping better. On chronic trazadone since 2006 when she got sober.  Still coughing some. Took another 5 days of Azithromycin 500 mg. Cough is occasionally productive of greenish thick phlegm. No SOB or chest pain. She does have drenching night sweats for the past month. Last TB test 1 year ago. Has h/o alcoholism.  O: NAD  HEENT: Swollen nasal passages with redness, TM's good, throat looks normal  Neck: no adenopathy  Chest: ronchi  Heart: Reg without murmur  Skin: sparsely Scattered erythematous papules on anterior chest  Results for orders placed in visit on 03/31/12   POCT CBC   Component  Value  Range    WBC  7.4  4.6 - 10.2 (K/uL)    Lymph, poc  2.1  0.6 - 3.4    POC LYMPH PERCENT  28.0  10 - 50 (%L)    MID (cbc)  0.5  0 - 0.9    POC MID %  7.1  0 - 12 (%M)    POC Granulocyte  4.8  2 - 6.9    Granulocyte percent  64.9  37 - 80 (%G)    RBC  3.42 (*)  4.04 - 5.48 (M/uL)    Hemoglobin  10.9 (*)  12.2 - 16.2 (g/dL)    HCT, POC  14.7 (*)  37.7 - 47.9 (%)    MCV  96.2  80 - 97 (fL)    MCH, POC  31.9 (*)  27 - 31.2 (pg)    MCHC  33.1  31.8 - 35.4 (g/dL)    RDW, POC  82.9     Platelet Count, POC  274  142 - 424 (K/uL)    MPV  8.8  0 - 99.8 (fL)   VITAMIN B12   Component  Value  Range    Vitamin B-12  >2000 (*)  211 - 911 (pg/mL)   VITAMIN D 25 HYDROXY   Component  Value  Range    Vit D, 25-Hydroxy  76  30 - 89 (ng/mL)   IBC PANEL   Component  Value  Range    UIBC  236  125 - 400 (ug/dL)    TIBC  562  130 - 865 (ug/dL)    %SAT  14 (*)  20 - 55 (%)   IRON   Component  Value  Range    Iron  38 (*)  42 - 145 (ug/dL)   A: Gradually resolving symptoms  Uncertain cause of the anemia  The  night sweats is of concern, especially with respect to the cough and her frail body habitus and h/o alcohol use.  P: Recheck CBC  Reticulocyte count  TB skin test  Since the above note, patient has noted decreasing cough and less maladies. She still has the drenching night sweats.  Objective: HEENT unremarkable, chest: Few rhonchi, heart,: Regular no murmur, skin:  The changes on hands unchanged  No edema, patient alert and cooperative, gait normal, labs were review with patient for 35 minutes.  Assessment: Night sweats persisting, need explanation as well as the anemia.  Plan: Ask Theron Arista ever for consultation, check ACE level.

## 2012-04-10 ENCOUNTER — Other Ambulatory Visit (INDEPENDENT_AMBULATORY_CARE_PROVIDER_SITE_OTHER): Payer: BC Managed Care – PPO

## 2012-04-10 DIAGNOSIS — R61 Generalized hyperhidrosis: Secondary | ICD-10-CM

## 2012-04-10 LAB — ANGIOTENSIN CONVERTING ENZYME: Angiotensin-Converting Enzyme: 35 U/L (ref 8–52)

## 2012-04-14 ENCOUNTER — Telehealth: Payer: Self-pay | Admitting: Hematology & Oncology

## 2012-04-14 NOTE — Telephone Encounter (Signed)
Pt aware of 04-28-12 appointment. She is aware she may to wait up to an hour to see MD

## 2012-04-23 ENCOUNTER — Other Ambulatory Visit: Payer: Self-pay | Admitting: Physician Assistant

## 2012-04-28 ENCOUNTER — Ambulatory Visit (HOSPITAL_BASED_OUTPATIENT_CLINIC_OR_DEPARTMENT_OTHER): Payer: BC Managed Care – PPO | Admitting: Hematology & Oncology

## 2012-04-28 ENCOUNTER — Ambulatory Visit: Payer: BC Managed Care – PPO

## 2012-04-28 ENCOUNTER — Other Ambulatory Visit (HOSPITAL_BASED_OUTPATIENT_CLINIC_OR_DEPARTMENT_OTHER): Payer: BC Managed Care – PPO | Admitting: Lab

## 2012-04-28 VITALS — BP 116/78 | HR 82 | Temp 98.1°F | Ht 66.0 in | Wt 113.0 lb

## 2012-04-28 DIAGNOSIS — D649 Anemia, unspecified: Secondary | ICD-10-CM

## 2012-04-28 LAB — CBC WITH DIFFERENTIAL (CANCER CENTER ONLY)
BASO#: 0 10*3/uL (ref 0.0–0.2)
BASO%: 0.3 % (ref 0.0–2.0)
EOS%: 0.8 % (ref 0.0–7.0)
HCT: 32.3 % — ABNORMAL LOW (ref 34.8–46.6)
HGB: 11.4 g/dL — ABNORMAL LOW (ref 11.6–15.9)
LYMPH#: 1.3 10*3/uL (ref 0.9–3.3)
LYMPH%: 20.6 % (ref 14.0–48.0)
MCH: 33.6 pg (ref 26.0–34.0)
MCHC: 35.3 g/dL (ref 32.0–36.0)
MCV: 95 fL (ref 81–101)
MONO%: 8.4 % (ref 0.0–13.0)
NEUT%: 69.9 % (ref 39.6–80.0)
RDW: 13 % (ref 11.1–15.7)

## 2012-04-28 NOTE — Progress Notes (Signed)
This office note has been dictated.

## 2012-04-28 NOTE — Progress Notes (Signed)
CC:   Elvina Sidle, M.D.  DIAGNOSIS: 1. Normochromic, normocytic anemia. 2. Pernicious anemia. 3. Iron deficiency anemia.  HISTORY OF PRESENT ILLNESS:  Kristine Davidson is a very nice 61 year old white female.  She is originally from [place].  She has been down in the [place] area for the past 2 years.  She came down because her sister has Parkinson's and she is helping to take care of her.  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  She is seen by Dr. Milus Glazier.  She has been feeling tired.  She apparently was found have a low B12 level.  She has had anemia.  She had blood work done back in April.  A white cell count was 12, hemoglobin 10, hematocrit 31 and platelet count was 220.  She had some additional studies done.  She had an iron saturation of 14%.  She had iron of 29. I do not see where a ferritin was done.  She was told that she had a low B12.  She had a B12 level of 241, I guess, back in March.  She was started on B12.  She takes it sublingually.  She then had a level greater than 2000.  She does have fatigue.  As such, she was sent to the [facility] to help with the fatigue and possible anemia.  She has not had any "cravings."  She does not chew ice.  She does smoke. She is a recovering alcoholic.  She has been free of alcohol now for 7 years.  She has not had any obvious bleeding.  A colonoscopy was done a couple of years ago.  She has had no surgery in the past outside of, I think, tonsillectomy.  She has had no rashes.  She has no joint issues.  She is low on vitamin D and has started on vitamin D.  There has been no weight loss or weight gain.  She is not a vegetarian, although she does not eat much in way of red meat.  PAST MEDICAL HISTORY: 1. B12 deficiency. 2. Vitiligo. 3. Hypothyroidism.  ALLERGIES:  Codeine.  MEDICATIONS: 1. Synthroid 0.05 mg p.o. daily. 2. Iron 1 p.o. daily. 3. Paxil 20  mg p.o. daily. 4. Desyrel 50 mg p.o. q.h.s. 5. Valtrex 500 mg p.o. b.i.d. 6. Vitamin B12 250 mcg p.o. daily. 7. Vitamin D 2000 units p.o. daily.  SOCIAL HISTORY:  Remarkable for tobacco use.  She probably about a 40- pack-year history of tobacco use.  She is a recovering alcoholic. Again, she has not had any alcohol for 7 years.  There are no obvious occupational exposures.  She is a retired Runner, broadcasting/film/video.  FAMILY HISTORY:  Remarkable for the Parkinson disease.  There is no history of anemia in the family.  REVIEW OF SYSTEMS:  Remarkable for occasional, I think, palpitations secondary to her mitral valve prolapse.  PHYSICAL EXAM:  General:  This is a thin but well-nourished white female in no obvious distress.  Vital signs:  Temperature of 98.1, pulse 82, respiratory rate 18, blood pressure 116/78, weight is 113.  Head and neck:  Normocephalic, atraumatic skull.  There are no ocular or oral lesions.  There are no palpable cervical or supraclavicular lymph nodes. Lungs:  Clear to percussion and auscultation bilaterally.  Cardiac: Regular rhythm with a normal S1, S2.  There are no murmurs, rubs or bruits.  Abdomen:  Soft with good bowel sounds.  There is no  palpable abdominal mass.  There is no palpable hepatosplenomegaly.  Back:  Exam shows no tenderness over the spine, ribs, or hips.  Extremities:  No clubbing, cyanosis or edema.  She has good range motion of her joints. Skin:  Does show some areas of vitiligo on her hands.  Neurological:  No focal neurological deficits.  LABORATORY STUDIES:  White cell count is 6.1, hemoglobin 11.4, hematocrit 32.3, platelet count is 176.  MCV is 95.  Peripheral smear shows a normochromic, normocytic population of red blood cells.  There is minimal anisocytosis.  There is no poikilocytosis.  I see no nucleated red blood cells.  There are no target cells.  There are no teardrop cells.  I see no rouleaux formation.  There are no spherocytes.  White  cells appear normal in morphology and maturation.  There are no immature myeloid forms.  She has no hypersegmented polys.  There are no blasts.  I see no atypical lymphocytes.  Platelets are adequate in number and size.  IMPRESSION:  Ms. Storbeck is a very nice 61 year old white female with minimal anemia.  She may have been iron-deficient.  She is improving. She is not microcytic.  The oral iron must be doing something for her. Her hemoglobin has come up nicely in a matter of 1 month.  I really do not see anything that would suggest underlying hematologic malignancy.  Her blood smear looked relatively "bland" which I am happy about.  We did send off our typical battery of anemia studies.  We will see what her iron studies are.  I did send off an erythropoietin level on her.  I do not see any evidence of renal insufficiency.  I do not see any evidence of hypothyroidism.  Her TSH back 2-3 months ago was 1.3.  She is on Synthroid.  There is a question of sarcoidosis.  However, her angiotensin-converting enzyme level in April was 35 which I believe is normal.  For now, I do not see that we are going to have to get Ms. Blankley back. I do not see that there are going to be any issues that we are going to have to intervene with.  Hopefully, we can take care of everything over the phone.  I spent a good hour or so with Ms. Chism.  She is very nice.  I certainly commend her for 7 years of being alcohol-free.  This has certainly been a battle for her but she is winning.    ______________________________ Josph Macho, M.D. PRE/MEDQ  D:  04/28/2012  T:  04/28/2012  Job:  [id number]

## 2012-05-03 LAB — PROTEIN ELECTROPHORESIS, SERUM, WITH REFLEX
Albumin ELP: 61.8 % (ref 55.8–66.1)
Alpha-1-Globulin: 5.5 % — ABNORMAL HIGH (ref 2.9–4.9)
Alpha-2-Globulin: 9.6 % (ref 7.1–11.8)
Beta 2: 3.8 % (ref 3.2–6.5)

## 2012-05-03 LAB — IRON AND TIBC: Iron: 50 ug/dL (ref 42–145)

## 2012-05-03 LAB — FERRITIN: Ferritin: 132 ng/mL (ref 10–291)

## 2012-05-03 LAB — RETICULOCYTES (CHCC): Retic Ct Pct: 3.1 % — ABNORMAL HIGH (ref 0.4–2.3)

## 2012-06-25 ENCOUNTER — Other Ambulatory Visit: Payer: Self-pay | Admitting: Family Medicine

## 2012-08-01 ENCOUNTER — Ambulatory Visit (INDEPENDENT_AMBULATORY_CARE_PROVIDER_SITE_OTHER): Payer: BC Managed Care – PPO | Admitting: Family Medicine

## 2012-08-01 VITALS — BP 110/72 | HR 64 | Temp 98.5°F | Resp 20 | Ht 65.5 in | Wt 109.8 lb

## 2012-08-01 DIAGNOSIS — L089 Local infection of the skin and subcutaneous tissue, unspecified: Secondary | ICD-10-CM

## 2012-08-01 DIAGNOSIS — L0390 Cellulitis, unspecified: Secondary | ICD-10-CM

## 2012-08-01 DIAGNOSIS — E039 Hypothyroidism, unspecified: Secondary | ICD-10-CM

## 2012-08-01 DIAGNOSIS — G4700 Insomnia, unspecified: Secondary | ICD-10-CM

## 2012-08-01 DIAGNOSIS — D619 Aplastic anemia, unspecified: Secondary | ICD-10-CM

## 2012-08-01 DIAGNOSIS — W57XXXA Bitten or stung by nonvenomous insect and other nonvenomous arthropods, initial encounter: Secondary | ICD-10-CM

## 2012-08-01 LAB — POCT CBC
Granulocyte percent: 60.3 %G (ref 37–80)
HCT, POC: 40.2 % (ref 37.7–47.9)
Hemoglobin: 12.7 g/dL (ref 12.2–16.2)
Lymph, poc: 1.6 (ref 0.6–3.4)
MCH, POC: 30.7 pg (ref 27–31.2)
MCHC: 31.6 g/dL — AB (ref 31.8–35.4)
MCV: 97.2 fL — AB (ref 80–97)
MID (cbc): 0.3 (ref 0–0.9)
MPV: 8.7 fL (ref 0–99.8)
POC Granulocyte: 2.9 (ref 2–6.9)
POC LYMPH PERCENT: 32.5 %L (ref 10–50)
POC MID %: 7.2 %M (ref 0–12)
Platelet Count, POC: 198 10*3/uL (ref 142–424)
RBC: 4.14 M/uL (ref 4.04–5.48)
RDW, POC: 14.1 %
WBC: 4.8 10*3/uL (ref 4.6–10.2)

## 2012-08-01 MED ORDER — DOXYCYCLINE HYCLATE 100 MG PO TABS: 100.0000 mg | ORAL_TABLET | Freq: Two times a day (BID) | ORAL | Status: DC

## 2012-08-01 MED ORDER — TRAZODONE HCL 50 MG PO TABS: 50.0000 mg | ORAL_TABLET | Freq: Every day | ORAL | Status: DC

## 2012-08-01 MED ORDER — LEVOTHYROXINE SODIUM 50 MCG PO TABS: 50.0000 ug | ORAL_TABLET | Freq: Every day | ORAL | Status: DC

## 2012-08-01 MED ORDER — DOXYCYCLINE HYCLATE 100 MG PO TABS: 100.0000 mg | ORAL_TABLET | Freq: Two times a day (BID) | ORAL | Status: AC

## 2012-08-01 MED ORDER — PAROXETINE HCL 20 MG PO TABS: 20.0000 mg | ORAL_TABLET | ORAL | Status: DC

## 2012-08-01 NOTE — Patient Instructions (Signed)
Results for orders placed in visit on 08/01/12  POCT CBC      Component Value Range   WBC 4.8  4.6 - 10.2 K/uL   Lymph, poc 1.6  0.6 - 3.4   POC LYMPH PERCENT 32.5  10 - 50 %L   MID (cbc) 0.3  0 - 0.9   POC MID % 7.2  0 - 12 %M   POC Granulocyte 2.9  2 - 6.9   Granulocyte percent 60.3  37 - 80 %G   RBC 4.14  4.04 - 5.48 M/uL   Hemoglobin 12.7  12.2 - 16.2 g/dL   HCT, POC 82.9  56.2 - 47.9 %   MCV 97.2 (*) 80 - 97 fL   MCH, POC 30.7  27 - 31.2 pg   MCHC 31.6 (*) 31.8 - 35.4 g/dL   RDW, POC 13.0     Platelet Count, POC 198  142 - 424 K/uL   MPV 8.7  0 - 99.8 fL

## 2012-08-01 NOTE — Progress Notes (Signed)
A 61 year old woman comes in with several issues: 1-she has large red circular raised, warm and tender lesions on her right and left legs in the thigh region. She recently been to a park where she was seeking environmental studies of children. These areas have gotten worse and are now over 6 inches in diameter with a bull's-eye appearance with intense erythema closer to the center. 2-patient is refill on her insomnia medicines, Paxil and trazodone. She's been doing well on these medications. 3-patient needs refill on her thyroid medicine. She had her last checked in February and the level is 1.23. She's had no further hypothyroid symptoms. 4-patient has history of anemia needs to have her blood count checked. She's been evaluated by Dr. Myna Hidalgo and her counts have been steady on iron.  Objective: No acute distress Examination of thighs reveals 12 cm in diameter right intensely red, warm confluent erythematous rash, and a 4 cm in diameter right intensely red rash on the left thigh in 2 areas. The center part of these red bull's-eye type rashes is a 1 mm pustule.  Results for orders placed in visit on 04/28/12  CBC WITH DIFFERENTIAL ([facility])      Component Value Range   WBC 6.1  3.9 - 10.0 10e3/uL   RBC 3.39 (*) 3.70 - 5.32 10e6/uL   HGB 11.4 (*) 11.6 - 15.9 g/dL   HCT 16.1 (*) 09.6 - 04.5 %   MCV 95  81 - 101 fL   MCH 33.6  26.0 - 34.0 pg   MCHC 35.3  32.0 - 36.0 g/dL   RDW 40.9  81.1 - 91.4 %   Platelets 176  145 - 400 10e3/uL   NEUT# 4.2  1.5 - 6.5 10e3/uL   LYMPH# 1.3  0.9 - 3.3 10e3/uL   MONO# 0.5  0.1 - 0.9 10e3/uL   Eosinophils Absolute 0.1  0.0 - 0.5 10e3/uL   BASO# 0.0  0.0 - 0.2 10e3/uL   NEUT% 69.9  39.6 - 80.0 %   LYMPH% 20.6  14.0 - 48.0 %   MONO% 8.4  0.0 - 13.0 %   EOS% 0.8  0.0 - 7.0 %   BASO% 0.3  0.0 - 2.0 %  [facility] - SMEAR      Component Value Range   Smear Result Smear Available    ERYTHROPOIETIN      Component Value Range   Erythropoietin 20.2   2.6 - 34.0 mIU/mL  FERRITIN      Component Value Range   Ferritin 132  10 - 291 ng/mL  TRANSFERRIN RECEPTOR, SOLUABLE      Component Value Range   Transferrin Receptor, Soluble 1.59  0.76 - 1.76 mg/L  PROTEIN ELECTROPHORESIS, SERUM, WITH REFLEX      Component Value Range   Total Protein, serum electrophor 6.6  6.0 - 8.3 g/dL   Albumin ELP 78.2  95.6 - 66.1 %   Alpha-1-Globulin 5.5 (*) 2.9 - 4.9 %   Alpha-2-Globulin 9.6  7.1 - 11.8 %   Beta Globulin 5.9  4.7 - 7.2 %   Beta 2 3.8  3.2 - 6.5 %   Gamma Globulin 13.4  11.1 - 18.8 %   M-Spike, % NOT DET     SPE Interp. *     COMMENT (PROTEIN ELECTROPHOR) *    IRON AND TIBC      Component Value Range   Iron 50  42 - 145 ug/dL   UIBC 213  086 - 578 ug/dL  TIBC 340  250 - 470 ug/dL   %SAT 15 (*) 20 - 55 %  RETICULOCYTE COUNT (SLN)      Component Value Range   Retic Ct Pct 3.1 (*) 0.4 - 2.3 %   RBC. 3.52 (*) 3.87 - 5.11 MIL/uL   ABS Retic 109.1  19.0 - 186.0 K/uL   Results for orders placed in visit on 08/01/12  POCT CBC      Component Value Range   WBC 4.8  4.6 - 10.2 K/uL   Lymph, poc 1.6  0.6 - 3.4   POC LYMPH PERCENT 32.5  10 - 50 %L   MID (cbc) 0.3  0 - 0.9   POC MID % 7.2  0 - 12 %M   POC Granulocyte 2.9  2 - 6.9   Granulocyte percent 60.3  37 - 80 %G   RBC 4.14  4.04 - 5.48 M/uL   Hemoglobin 12.7  12.2 - 16.2 g/dL   HCT, POC 16.1  09.6 - 47.9 %   MCV 97.2 (*) 80 - 97 fL   MCH, POC 30.7  27 - 31.2 pg   MCHC 31.6 (*) 31.8 - 35.4 g/dL   RDW, POC 04.5     Platelet Count, POC 198  142 - 424 K/uL   MPV 8.7  0 - 99.8 fL    Assessment: Acute in sec rash with cellulitis which is worsening, stable thyroid situation, stable insomnia situation  Plan: 1. Insect bite  Rocky mtn spotted fvr ab, IgM-blood, B. burgdorfi antibodies, doxycycline (VIBRA-TABS) 100 MG tablet  2. Cellulitis  POCT CBC, doxycycline (VIBRA-TABS) 100 MG tablet, DISCONTINUED: doxycycline (VIBRA-TABS) 100 MG tablet  3. Insomnia  PARoxetine (PAXIL) 20 MG  tablet, traZODone (DESYREL) 50 MG tablet  4. Hypothyroid  levothyroxine (SYNTHROID, LEVOTHROID) 50 MCG tablet

## 2012-08-02 LAB — ROCKY MTN SPOTTED FVR AB, IGM-BLOOD: ROCKY MTN SPOTTED FEVER, IGM: 0.25 IV

## 2012-08-02 LAB — B. BURGDORFI ANTIBODIES: B burgdorferi Ab IgG+IgM: 0.18 {ISR}

## 2012-08-25 ENCOUNTER — Encounter: Payer: Self-pay | Admitting: Family Medicine

## 2012-09-26 ENCOUNTER — Other Ambulatory Visit: Payer: Self-pay | Admitting: Internal Medicine

## 2012-11-24 ENCOUNTER — Ambulatory Visit (INDEPENDENT_AMBULATORY_CARE_PROVIDER_SITE_OTHER): Payer: BC Managed Care – PPO | Admitting: Physician Assistant

## 2012-11-24 VITALS — BP 96/67 | HR 80 | Temp 97.9°F | Resp 16 | Ht 66.0 in | Wt 112.4 lb

## 2012-11-24 DIAGNOSIS — Z23 Encounter for immunization: Secondary | ICD-10-CM

## 2012-11-24 NOTE — Progress Notes (Signed)
  Subjective:    Patient ID: Kristine Davidson, female    DOB: 03-10-1951, 61 y.o.   MRN: [id number]  HPI 61 yr old female here for flu shot only and was accidentally put in system as an OV.   Review of Systemsn/a     Objective:   Physical Examn/a        Assessment & Plan:  Ok for flu shot

## 2013-02-13 ENCOUNTER — Ambulatory Visit: Payer: BC Managed Care – PPO

## 2013-02-13 ENCOUNTER — Ambulatory Visit (INDEPENDENT_AMBULATORY_CARE_PROVIDER_SITE_OTHER): Payer: BC Managed Care – PPO | Admitting: Emergency Medicine

## 2013-02-13 VITALS — BP 106/68 | HR 73 | Temp 98.1°F | Resp 16 | Ht 66.75 in | Wt 111.6 lb

## 2013-02-13 DIAGNOSIS — R918 Other nonspecific abnormal finding of lung field: Secondary | ICD-10-CM

## 2013-02-13 DIAGNOSIS — R079 Chest pain, unspecified: Secondary | ICD-10-CM

## 2013-02-13 DIAGNOSIS — R9431 Abnormal electrocardiogram [ECG] [EKG]: Secondary | ICD-10-CM

## 2013-02-13 DIAGNOSIS — M546 Pain in thoracic spine: Secondary | ICD-10-CM

## 2013-02-13 NOTE — Progress Notes (Signed)
  Subjective:    Patient ID: Kristine Davidson, female    DOB: 06-Jul-1951, 62 y.o.   MRN: [id number]  Chest Pain  This is a new problem. The current episode started in the past 7 days (3 days). The onset quality is sudden. The problem occurs constantly. The problem has been gradually improving (by taking the Aleve). The pain is at a severity of 4/10. The pain is moderate. The quality of the pain is described as dull. The pain radiates to the left shoulder, left arm and upper back. Associated symptoms include headaches and numbness. Pertinent negatives include no dizziness, nausea, palpitations or vomiting. Associated symptoms comments: Headache and numbness on left side of head and upper arm. She has tried NSAIDs for the symptoms. The treatment provided mild relief.  Anxiety Symptoms include chest pain. Patient reports no dizziness, nausea or palpitations.     Patient comes into our office today with complaints of left upper shoulder pain that radiates to the left front side of chest for three days now she has some mild upper numbness in left arm Haven't used any ice or heat but she do states that if she takes Aleve the pain ease up some    Review of Systems  Cardiovascular: Positive for chest pain. Negative for palpitations.  Gastrointestinal: Negative for nausea and vomiting.  Neurological: Positive for numbness and headaches. Negative for dizziness and light-headedness.       Objective:   Physical Exam  HEENT exam is unremarkable. While talking to the patient she explained the situation with her sister and started crying. 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 Her chest was clear her heart was regular rate without murmurs rubs or gallops abdomen soft nontender there no carotid bruits heard.  Ekg there is one PAC.  There is a short PR interval.  [facility] reading (PRIMARY) by  Dr. Cleta Alberts there is severe degenerative disc disease at C5-6-7 with arthritic change .       Assessment & Plan:  Patient here with neck chest and back pain consistent with a cervical radiculopathy. EKG does not show any acute changes but there is a short PR interval. She is under a great deal of stress recently related to family situation. She also has a history of pulmonary nodules and is a heavy smoker. We'll schedule a CT chest no contrast followup on her pulmonary nodules we'll also treat her chest pain with anti-inflammatories along with a medication for stress and anxiety. Referral made to cardiology because of the abnormal EKG . She will treat her neck pain with Aleve one to 2 twice a day .

## 2013-02-24 ENCOUNTER — Other Ambulatory Visit: Payer: BC Managed Care – PPO

## 2013-03-01 ENCOUNTER — Other Ambulatory Visit: Payer: BC Managed Care – PPO

## 2013-03-03 ENCOUNTER — Other Ambulatory Visit: Payer: Self-pay | Admitting: Radiology

## 2013-03-03 ENCOUNTER — Ambulatory Visit
Admission: RE | Admit: 2013-03-03 | Discharge: 2013-03-03 | Disposition: A | Discharge status: Home / Self Care | Payer: BC Managed Care – PPO | Source: Ambulatory Visit | Attending: Emergency Medicine | Admitting: Emergency Medicine

## 2013-03-03 DIAGNOSIS — R918 Other nonspecific abnormal finding of lung field: Secondary | ICD-10-CM

## 2013-03-03 DIAGNOSIS — I7789 Other specified disorders of arteries and arterioles: Secondary | ICD-10-CM

## 2013-03-10 ENCOUNTER — Ambulatory Visit (INDEPENDENT_AMBULATORY_CARE_PROVIDER_SITE_OTHER): Payer: BC Managed Care – PPO | Admitting: Cardiology

## 2013-03-10 ENCOUNTER — Encounter: Payer: Self-pay | Admitting: Cardiology

## 2013-03-10 VITALS — BP 120/88 | HR 70 | Ht 66.75 in | Wt 114.0 lb

## 2013-03-10 DIAGNOSIS — R9431 Abnormal electrocardiogram [ECG] [EKG]: Secondary | ICD-10-CM

## 2013-03-10 NOTE — Progress Notes (Signed)
HPI The patient presents for evaluation of an abnormal EKG.  She recently presented to her primary provider with some neck pain. This was posterior but radiated down her left arm. X-ray apparently demonstrated a cervical disc abnormality. She treated herself with some over-the-counter nonsteroidal and this has improved. However, she was noted to have PACs on EKG. She does have risk factors. She has a history of mitral valve prolapse. She was referred for further evaluation.  She has no prior other cardiac history. She does her usual activities such as vacuuming and walking the dog.  The patient denies any new symptoms such as chest discomfort, neck or arm discomfort. There has been no new shortness of breath, PND or orthopnea. There have been no reported palpitations, presyncope or syncope.    Allergies  Allergen Reactions  . Codeine Itching    Current Outpatient Prescriptions  Medication Sig Dispense Refill  . cholecalciferol (VITAMIN D) 1000 UNITS tablet Take 2,000 Units by mouth daily.       Marland Kitchen levothyroxine (SYNTHROID, LEVOTHROID) 50 MCG tablet Take 1 tablet (50 mcg total) by mouth daily.  90 tablet  3  . PARoxetine (PAXIL) 20 MG tablet Take 1 tablet (20 mg total) by mouth every morning.  90 tablet  3  . traZODone (DESYREL) 50 MG tablet Take 25 mg by mouth at bedtime.      . valACYclovir (VALTREX) 1000 MG tablet TAKE ONE-HALF TABLET BY MOUTH TWICE DAILY FOR 3 DAYS AS NEEDED FOR OUTBREAK.  30 tablet  98  . vitamin B-12 (CYANOCOBALAMIN) 250 MCG tablet Take 250 mcg by mouth daily.       No current facility-administered medications for this visit.    Past Medical History  Diagnosis Date  . Depression   . Mitral valve prolapse   . Anemia   . Diverticular disease   . Thyroid disease   . HSV-2 (herpes simplex virus 2) infection     Past Surgical History  Procedure Laterality Date  . Tonsillectomy    . D and c    . Varicose vein surgery  2000    Family History  Problem Relation  Age of Onset  . Cancer Mother   . Stroke Mother     History   Social History  . Marital Status: Divorced    Spouse Name: N/A    Number of Children: N/A  . Years of Education: N/A   Occupational History  . Teacher     Retired   Social History Main Topics  . Smoking status: Current Every Day Smoker -- 0.50 packs/day for 43 years    Types: Cigarettes  . Smokeless tobacco: Not on file  . Alcohol Use: No  . Drug Use: No  . Sexually Active: Not on file   Other Topics Concern  . Not on file   Social History Narrative   Lives alone.     ROS:  As stated in the HPI and negative for all other systems.   PHYSICAL EXAM BP 120/88  Pulse 70  Ht 5' 6.75" (1.695 m)  Wt 114 lb (51.71 kg)  BMI 18 kg/m2 GENERAL:  Well appearing HEENT:  Pupils equal round and reactive, fundi not visualized, oral mucosa unremarkable NECK:  No jugular venous distention, waveform within normal limits, carotid upstroke brisk and symmetric, no bruits, no thyromegaly LYMPHATICS:  No cervical, inguinal adenopathy LUNGS:  Clear to auscultation bilaterally BACK:  No CVA tenderness CHEST:  Unremarkable HEART:  PMI not displaced or sustained,S1 and  S2 within normal limits, no S3, no S4, no clicks, no rubs, no murmurs ABD:  Flat, positive bowel sounds normal in frequency in pitch, no bruits, no rebound, no guarding, no midline pulsatile mass, no hepatomegaly, no splenomegaly EXT:  2 plus pulses throughout, no edema, no cyanosis no clubbing SKIN:  No rashes no nodules NEURO:  Cranial nerves II through XII grossly intact, motor grossly intact throughout PSYCH:  Cognitively intact, oriented to person place and time   EKG:  Sinus rhythm, rate 64, axis within normal limits, intervals within normal limits, no acute ST-T wave changes.  03/10/2013   ASSESSMENT AND PLAN   PACs:  He does not feel these. No further cardiovascular testing is suggested.  MVP:  This is likely mild. It would not suspect associated  mitral regurgitation. No further imaging is suggested.  NECK PAIN:  This is atypical. However, with her tobacco history I would like to screen her with an exercise treadmill test. I will bring the patient back for a POET (Plain Old Exercise Test). This will allow me to screen for obstructive coronary disease, risk stratify and very importantly provide a prescription for exercise.  TOBACCO:  We discussed this at length. I don't think she'd be a good candidate for Chantix. We talked about nicotine replacement therapies. She can consider this.

## 2013-03-10 NOTE — Patient Instructions (Addendum)
The current medical regimen is effective;  continue present plan and medications.  Your physician has requested that you have an exercise tolerance test. For further information please visit [email or web address]. Please also follow instruction sheet, as given.   

## 2013-03-29 ENCOUNTER — Encounter: Payer: BC Managed Care – PPO | Admitting: Physician Assistant

## 2013-04-10 ENCOUNTER — Encounter: Payer: Self-pay | Admitting: *Deleted

## 2013-04-30 ENCOUNTER — Ambulatory Visit (INDEPENDENT_AMBULATORY_CARE_PROVIDER_SITE_OTHER): Payer: BC Managed Care – PPO | Admitting: Family Medicine

## 2013-04-30 VITALS — BP 109/74 | HR 71 | Temp 97.6°F | Resp 16 | Ht 66.0 in | Wt 112.0 lb

## 2013-04-30 DIAGNOSIS — J329 Chronic sinusitis, unspecified: Secondary | ICD-10-CM

## 2013-04-30 DIAGNOSIS — L439 Lichen planus, unspecified: Secondary | ICD-10-CM

## 2013-04-30 MED ORDER — AMOXICILLIN 875 MG PO TABS: 875.0000 mg | ORAL_TABLET | Freq: Two times a day (BID) | ORAL | Status: DC

## 2013-04-30 NOTE — Patient Instructions (Addendum)
Lichen Planus Lichen planus is a skin problem that causes redness, itching, swelling, and sores. Some common areas affected are the:  Vulva and vagina.  Gums and inside of the mouth.  Esophagus.  Scalp.  Skin of the arms (especially wrists), legs, chest, back, and abdomen.  Fingernails or toenails. CAUSES The cause is not known. It could be an autoimmune illness or an allergy. An autoimmune illness is one where your body attacks itself. Lichen planus is not passed from one person to another (not contagious). It can last for a long time. Affected children usually have a history of other family members with lichen planus. SYMPTOMS  Itching, which can be severe.  The vaginal area may be red, sore, raw, or have a burning feeling.  There may be pain or bleeding during sex.  Scarring may cause the vagina to become short, narrow, or even closed up.  The skin may have small reddish or purplish, flat-topped, round or irregularly shaped bumps.  There may be redness or white patches on the gums or tongue.  The nails may become thin, rough, or have ridges in them.  The eyes can rarely also be involved with pain, redness, or scarring. DIAGNOSIS Your caregiver will look for skin changes, changes inside your mouth, or vaginal discharge. Sometimes, a tissue sample (biopsy) may be sent for testing. TREATMENT  Your caregiver may order a cream (topical steroid) to be put on the sores.  You may be given medicine to take by mouth.  You may be treated by exposure to ultraviolet light.  Sores in the mouth may be treated with lozenges that you suck on like a cough drop.  If the vagina becomes too tight, you may be taught how to use a dilator to keep it open. HOME CARE INSTRUCTIONS  Only take over-the-counter or prescription medicines as directed by your caregiver.  Keep the vaginal area as clean and dry as possible. SEEK MEDICAL CARE IF: You develop increasing pain, swelling, or  redness. Document Released: 04/30/2011 Document Revised: 02/29/2012 Document Reviewed: 04/30/2011 [facility] Patient Information 2013 [place], [place]. Sinusitis Sinusitis is redness, soreness, and swelling (inflammation) of the paranasal sinuses. Paranasal sinuses are air pockets within the bones of your face (beneath the eyes, the middle of the forehead, or above the eyes). In healthy paranasal sinuses, mucus is able to drain out, and air is able to circulate through them by way of your nose. However, when your paranasal sinuses are inflamed, mucus and air can become trapped. This can allow bacteria and other germs to grow and cause infection. Sinusitis can develop quickly and last only a short time (acute) or continue over a long period (chronic). Sinusitis that lasts for more than 12 weeks is considered chronic.  CAUSES  Causes of sinusitis include:  Allergies.  Structural abnormalities, such as displacement of the cartilage that separates your nostrils (deviated septum), which can decrease the air flow through your nose and sinuses and affect sinus drainage.  Functional abnormalities, such as when the small hairs (cilia) that line your sinuses and help remove mucus do not work properly or are not present. SYMPTOMS  Symptoms of acute and chronic sinusitis are the same. The primary symptoms are pain and pressure around the affected sinuses. Other symptoms include:  Upper toothache.  Earache.  Headache.  Bad breath.  Decreased sense of smell and taste.  A cough, which worsens when you are lying flat.  Fatigue.  Fever.  Thick drainage from your nose, which often is  green and may contain pus (purulent).  Swelling and warmth over the affected sinuses. DIAGNOSIS  Your caregiver will perform a physical exam. During the exam, your caregiver may:  Look in your nose for signs of abnormal growths in your nostrils (nasal polyps).  Tap over the affected sinus to check for signs of  infection.  View the inside of your sinuses (endoscopy) with a special imaging device with a light attached (endoscope), which is inserted into your sinuses. If your caregiver suspects that you have chronic sinusitis, one or more of the following tests may be recommended:  Allergy tests.  Nasal culture A sample of mucus is taken from your nose and sent to a lab and screened for bacteria.  Nasal cytology A sample of mucus is taken from your nose and examined by your caregiver to determine if your sinusitis is related to an allergy. TREATMENT  Most cases of acute sinusitis are related to a viral infection and will resolve on their own within 10 days. Sometimes medicines are prescribed to help relieve symptoms (pain medicine, decongestants, nasal steroid sprays, or saline sprays).  However, for sinusitis related to a bacterial infection, your caregiver will prescribe antibiotic medicines. These are medicines that will help kill the bacteria causing the infection.  Rarely, sinusitis is caused by a fungal infection. In theses cases, your caregiver will prescribe antifungal medicine. For some cases of chronic sinusitis, surgery is needed. Generally, these are cases in which sinusitis recurs more than 3 times per year, despite other treatments. HOME CARE INSTRUCTIONS   Drink plenty of water. Water helps thin the mucus so your sinuses can drain more easily.  Use a humidifier.  Inhale steam 3 to 4 times a day (for example, sit in the bathroom with the shower running).  Apply a warm, moist washcloth to your face 3 to 4 times a day, or as directed by your caregiver.  Use saline nasal sprays to help moisten and clean your sinuses.  Take over-the-counter or prescription medicines for pain, discomfort, or fever only as directed by your caregiver. SEEK IMMEDIATE MEDICAL CARE IF:  You have increasing pain or severe headaches.  You have nausea, vomiting, or drowsiness.  You have swelling around your  face.  You have vision problems.  You have a stiff neck.  You have difficulty breathing. MAKE SURE YOU:   Understand these instructions.  Will watch your condition.  Will get help right away if you are not doing well or get worse. Document Released: 12/07/2005 Document Revised: 02/29/2012 Document Reviewed: 12/22/2011 [facility] Patient Information 2013 [place], [place].

## 2013-04-30 NOTE — Progress Notes (Signed)
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 He comes in with a week of sinus congestion, malaise, cheek pressure, and one day of green nasal discharge. She's had some tightness in her chest as well.  Daily meds include no chest pain, no fever  Objective: Alert, appropriate, no acute distress HEENT: Significant for mucopurulent discharge in both nasal passages, otherwise negative Neck: Supple no adenopathy Chest: Clear Heart: Regular no murmur Extremities: No edema  Patient has been to look at the rash on her elbows which is been present for several years. This rash is a polygonal elevated erythematous rash consistent with lichen planus.  Assessment: Sinus infection, acute; lichen planus, chronic  Plan: Patient given information on lichen planus  amoxicillin 875 twice a day  Signed, Sheila Oats.D.

## 2013-06-07 ENCOUNTER — Ambulatory Visit (INDEPENDENT_AMBULATORY_CARE_PROVIDER_SITE_OTHER): Payer: BC Managed Care – PPO | Admitting: Physician Assistant

## 2013-06-07 VITALS — BP 106/66 | HR 85 | Temp 98.1°F | Resp 16 | Ht 66.0 in | Wt 112.0 lb

## 2013-06-07 DIAGNOSIS — I781 Nevus, non-neoplastic: Secondary | ICD-10-CM

## 2013-06-07 NOTE — Progress Notes (Signed)
Patient ID: Kristine Davidson MRN: [id number], DOB: [birth year], 62 y.o. Date of Encounter: 06/07/2013, 4:21 PM  Primary Physician: Elvina Sidle, MD  Chief Complaint: Mole removal  HPI: 62 y.o. female with history below presents for mole removal. She states she noticed the mole this spring when she went to shave her legs. She is not certain how long the mole has been there. She states it was there prior to the winter, but fainter. She states this mole is now larger and has irregular borders. She would like for it to be removed. Several other moles along the right leg of similar appearance, although those are still faint.   She does have a history of considerable sun exposure throughout her life. She did use tanning beds in her young adult life. No sun screen use in young adulthood.    Past Medical History  Diagnosis Date  . Depression   . Mitral valve prolapse   . Anemia   . Diverticular disease   . Thyroid disease   . HSV-2 (herpes simplex virus 2) infection   . Substance abuse   . Hyperlipidemia      Home Meds: Prior to Admission medications   Medication Sig Start Date End Date Taking? Authorizing Provider  cholecalciferol (VITAMIN D) 1000 UNITS tablet Take 2,000 Units by mouth daily.    Yes Historical Provider, MD  levothyroxine (SYNTHROID, LEVOTHROID) 50 MCG tablet Take 1 tablet (50 mcg total) by mouth daily. 08/01/12  Yes Elvina Sidle, MD  PARoxetine (PAXIL) 20 MG tablet Take 1 tablet (20 mg total) by mouth every morning. 08/01/12  Yes Elvina Sidle, MD  traZODone (DESYREL) 50 MG tablet Take 25 mg by mouth at bedtime. 08/01/12  Yes Elvina Sidle, MD  valACYclovir (VALTREX) 1000 MG tablet TAKE ONE-HALF TABLET BY MOUTH TWICE DAILY FOR 3 DAYS AS NEEDED FOR OUTBREAK. 09/26/12  Yes Sarah Harvie Bridge, PA-C  vitamin B-12 (CYANOCOBALAMIN) 250 MCG tablet Take 250 mcg by mouth daily.   Yes Historical Provider, MD    Allergies:  Allergies  Allergen Reactions  . Codeine Itching     History   Social History  . Marital Status: Divorced    Spouse Name: N/A    Number of Children: N/A  . Years of Education: N/A   Occupational History  . Teacher     Retired   Social History Main Topics  . Smoking status: Current Every Day Smoker -- 0.50 packs/day for 43 years    Types: Cigarettes  . Smokeless tobacco: Not on file  . Alcohol Use: No  . Drug Use: No  . Sexually Active: Not on file   Other Topics Concern  . Not on file   Social History Narrative   Lives alone.      Review of Systems: Constitutional: negative for chills, fever, or fatigue  Dermatological: see above   Physical Exam: Blood pressure 106/66, pulse 85, temperature 98.1 F (36.7 C), temperature source Oral, resp. rate 16, height 5\' 6"  (1.676 m), weight 112 lb (50.803 kg), SpO2 96.00%., Body mass index is 18.09 kg/(m^2). General: Well developed, well nourished, in no acute distress. Head: Normocephalic, atraumatic, eyes without discharge, sclera non-icteric, nares are without discharge.  Neck: Supple. Full ROM.  Lungs: Breathing is unlabored. Heart: Regular rate. Msk:  Strength and tone normal for age. Extremities/Skin: Warm and dry. No clubbing or cyanosis. No edema. No rashes. 21 mm irregularly shaped nevus along medial aspect of right leg. Several lighter shade smaller nevi along right leg as  well, borders more regular.  Neuro: Alert and oriented X 3. Moves all extremities spontaneously. Gait is normal. CNII-XII grossly in tact. Psych:  Responds to questions appropriately with a normal affect.   Labs: Elliptical biopsy pending   ASSESSMENT AND PLAN:  62 y.o. female with irregular nevus right lower leg -Elliptical biopsy per above -Await pathology results -Dermatology referral for mole mapping  -Suture removal in 12-14 days   Signed, Eula Listen, PA-C 06/07/2013 4:21 PM

## 2013-06-13 ENCOUNTER — Other Ambulatory Visit: Payer: Self-pay | Admitting: Radiology

## 2013-06-13 DIAGNOSIS — L570 Actinic keratosis: Secondary | ICD-10-CM

## 2013-06-16 ENCOUNTER — Ambulatory Visit (INDEPENDENT_AMBULATORY_CARE_PROVIDER_SITE_OTHER): Payer: BC Managed Care – PPO | Admitting: Emergency Medicine

## 2013-06-16 VITALS — BP 106/68 | HR 85 | Temp 98.1°F | Resp 18 | Ht 65.5 in | Wt 111.0 lb

## 2013-06-16 DIAGNOSIS — Z4889 Encounter for other specified surgical aftercare: Secondary | ICD-10-CM

## 2013-06-16 NOTE — Progress Notes (Signed)
[facility] [address] [place] [address] [phone]  Date:  06/16/2013   Name:  Kristine Davidson   DOB:  1951/08/22   MRN:  [id number]  PCP:  Elvina Sidle, MD    Chief Complaint: Suture / Staple Removal   History of Present Illness:  Kristine Davidson is a 62 y.o. very pleasant female patient who presents with the following:  Suture removal   Patient Active Problem List   Diagnosis Date Noted  . HSV-2 (herpes simplex virus 2) infection 03/31/2012  . B12 deficiency 03/31/2012  . Vitamin D deficiency disease 03/31/2012  . H/O diverticulitis of colon 02/17/2012  . Hypothyroid 02/17/2012    Past Medical History  Diagnosis Date  . Depression   . Mitral valve prolapse   . Anemia   . Diverticular disease   . Thyroid disease   . HSV-2 (herpes simplex virus 2) infection   . Substance abuse   . Hyperlipidemia     Past Surgical History  Procedure Laterality Date  . Tonsillectomy    . D and c    . Varicose vein surgery  2000    History  Substance Use Topics  . Smoking status: Current Every Day Smoker -- 0.50 packs/day for 43 years    Types: Cigarettes  . Smokeless tobacco: Not on file  . Alcohol Use: No    Family History  Problem Relation Age of Onset  . Cancer Mother   . Stroke Mother   . Parkinson's disease Father   . Parkinson's disease Sister   . Diabetes Sister     Allergies  Allergen Reactions  . Codeine Itching    Medication list has been reviewed and updated.  Current Outpatient Prescriptions on File Prior to Visit  Medication Sig Dispense Refill  . cholecalciferol (VITAMIN D) 1000 UNITS tablet Take 2,000 Units by mouth daily.       Marland Kitchen levothyroxine (SYNTHROID, LEVOTHROID) 50 MCG tablet Take 1 tablet (50 mcg total) by mouth daily.  90 tablet  3  . PARoxetine (PAXIL) 20 MG tablet Take 1 tablet (20 mg total) by mouth every morning.  90 tablet  3  . traZODone (DESYREL) 50 MG tablet Take 25 mg by mouth at bedtime.       . valACYclovir (VALTREX) 1000 MG tablet TAKE ONE-HALF TABLET BY MOUTH TWICE DAILY FOR 3 DAYS AS NEEDED FOR OUTBREAK.  30 tablet  98  . vitamin B-12 (CYANOCOBALAMIN) 250 MCG tablet Take 250 mcg by mouth daily.       No current facility-administered medications on file prior to visit.    Review of Systems:  As per HPI, otherwise negative.    Physical Examination: Filed Vitals:   06/16/13 1355  BP: 106/68  Pulse: 85  Temp: 98.1 F (36.7 C)  Resp: 18   Filed Vitals:   06/16/13 1355  Height: 5' 5.5" (1.664 m)  Weight: 111 lb (50.349 kg)   Body mass index is 18.18 kg/(m^2). Ideal Body Weight: Weight in (lb) to have BMI = 25: 152.2   GEN: WDWN, NAD, Non-toxic, Alert & Oriented x 3 HEENT: Atraumatic, Normocephalic.  Ears and Nose: No external deformity. EXTR: No clubbing/cyanosis/edema NEURO: Normal gait.  PSYCH: Normally interactive. Conversant. Not depressed or anxious appearing.  Calm demeanor.  Wound healing  Assessment and Plan: Suture removal Steri strips applied   Signed,  Phillips Odor, MD

## 2013-08-22 ENCOUNTER — Other Ambulatory Visit: Payer: Self-pay | Admitting: Family Medicine

## 2013-08-25 ENCOUNTER — Encounter: Payer: Self-pay | Admitting: Family Medicine

## 2013-08-25 ENCOUNTER — Ambulatory Visit (INDEPENDENT_AMBULATORY_CARE_PROVIDER_SITE_OTHER): Payer: BC Managed Care – PPO | Admitting: Family Medicine

## 2013-08-25 VITALS — BP 110/82 | HR 75 | Temp 98.3°F | Resp 16 | Ht 66.0 in | Wt 110.0 lb

## 2013-08-25 DIAGNOSIS — Z01419 Encounter for gynecological examination (general) (routine) without abnormal findings: Secondary | ICD-10-CM

## 2013-08-25 DIAGNOSIS — Z23 Encounter for immunization: Secondary | ICD-10-CM

## 2013-08-25 DIAGNOSIS — E039 Hypothyroidism, unspecified: Secondary | ICD-10-CM

## 2013-08-25 DIAGNOSIS — G4700 Insomnia, unspecified: Secondary | ICD-10-CM

## 2013-08-25 DIAGNOSIS — Z113 Encounter for screening for infections with a predominantly sexual mode of transmission: Secondary | ICD-10-CM

## 2013-08-25 LAB — TSH: TSH: 2.059 u[IU]/mL (ref 0.350–4.500)

## 2013-08-25 LAB — HEPATITIS C ANTIBODY: HCV Ab: NEGATIVE

## 2013-08-25 LAB — T3, FREE: T3, Free: 2.4 pg/mL (ref 2.3–4.2)

## 2013-08-25 MED ORDER — VALACYCLOVIR HCL 1 G PO TABS: ORAL_TABLET | ORAL | Status: DC

## 2013-08-25 MED ORDER — LEVOTHYROXINE SODIUM 50 MCG PO TABS: 50.0000 ug | ORAL_TABLET | Freq: Every day | ORAL | Status: DC

## 2013-08-25 MED ORDER — PAROXETINE HCL 20 MG PO TABS: ORAL_TABLET | ORAL | Status: DC

## 2013-08-25 NOTE — Progress Notes (Signed)
S: This 62 y.o Cauc female is here today for to discuss medications/ refills and for STD testing. She is embarking on a new relationship (not currently sexually active), with a hx of HSV-2 taking Valacyclovir prn outbreaks. Last intimate sexual encounter >2 years ago. Pt has a chronic lesion on vulva that is nontender and not a herpetic lesion. She is postmenopausal and requests PAP (thinks last one was 2011). No hx of abnormal PAPs  She will sch own MMG.  Pt also has chronic depression w/ anxiety and insomnia. Paroxetine decreases libido so pt interested in reducing dose to see if it will continue to be effective w/o sexual side effect.  Pt needs refill on thyroid medication and has not had any lab monitoring within 12 months. She denies fatigue, vision changes, CP or palpitations, SOB or DOE, constipation/ diarrhea, skin or hair changes, HA, weakness or tremor. No reports of agitation, increased anxiety, dysphoric mood, behavior changes or major sleep disturbance.  Patient Active Problem List   Diagnosis Date Noted  . HSV-2 (herpes simplex virus 2) infection 03/31/2012  . B12 deficiency 03/31/2012  . Vitamin D deficiency disease 03/31/2012  . H/O diverticulitis of colon 02/17/2012  . Hypothyroid 02/17/2012   PMHx, Soc Hx and Fam Hx reviewed. Medications reconciled.  ROS; As per HPI.  O: Filed Vitals:   08/25/13 0856  BP: 110/82  Pulse: 75  Temp: 98.3 F (36.8 C)  Resp: 16   GEN: In NAD; WN,WD. HENT: [place]/AT; EOMI w/ clear conj/sclerae; wears corrective lenses. EACs/nose/oroph unremarkable. COR: RRR. LUNGS: Normal resp rate and effort. SKIN: W&D; intactw/o erythema, rashes or pallor. GU: NEFG; L posterior labia majora - 4-5 mm flat NT subcutaneous lesion associated at base of a pubic hair, no drainage.        Vaginal mucosa slightly dry but w/o erythema, discharge or bleeding. Cervix/os normal w/o discharge. PAP obtained.        Bimanual- No cervical motion tenderness, no adnexal  masses or tenderness; uterus midline and normal size and position, no palpable masses. MS: MAEs; no deformities or joint effusions. No c/c/e. NEURO: A&O x 3; CNs intact. Nonfocal. PSYCH: Pleasant, calm and attentive. Speech pattern and thought content normal. Judgement sound.  A/P: Hypothyroid - Plan: TSH, T3, free, levothyroxine (SYNTHROID, LEVOTHROID) 50 MCG tablet  Routine gynecological examination - Plan: HIV antibody, RPR, Pap IG, CT/NG w/ reflex HPV when ASC-U  Screening examination for venereal disease - Plan: Hepatitis C antibody, HIV antibody, RPR  Insomnia - Plan: PARoxetine (PAXIL) 20 MG tablet reduce dose to 1/2 tablet daily and monitor symptoms.  Need for prophylactic vaccination with combined diphtheria-tetanus-pertussis (DTP) vaccine - Plan: Tdap vaccine greater than or equal to 7yo IM   HSV-2 infection: suppression therapy- 1 gram tablet  Take 1/2 tab daily; if outbreak occurs, increase dose to 1 tab daily and contact office.

## 2013-08-25 NOTE — Patient Instructions (Addendum)
Medication changes--- Valacyclovir 1 gram tablet  Take 1/2 tablet daily. If you find that you have an outbreak on this daily dose, then increase to 1 tablet daily and contact the office to let us know about this dose increase (so that the adjustment can be made at the pharmacy).  Paroxetine (Paxil) 20 mg tablet   Take 1/2 tablet daily, eat as healthy as you can, stay positive with good clear positive daily affirmations.  Know that YOU ARE GOOD ENOUGH !!!  You need to return for thyroid monitoring in 6 months or sooner if needed.

## 2013-08-28 ENCOUNTER — Encounter: Payer: Self-pay | Admitting: Family Medicine

## 2013-08-29 LAB — PAP IG, CT-NG, RFX HPV ASCU
Chlamydia Probe Amp: NEGATIVE
GC Probe Amp: NEGATIVE

## 2013-08-30 ENCOUNTER — Encounter: Payer: Self-pay | Admitting: Family Medicine

## 2013-10-04 ENCOUNTER — Other Ambulatory Visit: Payer: Self-pay | Admitting: Physician Assistant

## 2013-10-26 ENCOUNTER — Other Ambulatory Visit: Payer: Self-pay

## 2013-11-08 NOTE — Telephone Encounter (Signed)
error 

## 2013-12-04 ENCOUNTER — Ambulatory Visit (INDEPENDENT_AMBULATORY_CARE_PROVIDER_SITE_OTHER): Payer: BC Managed Care – PPO | Admitting: Family Medicine

## 2013-12-04 ENCOUNTER — Ambulatory Visit: Payer: No Typology Code available for payment source

## 2013-12-04 VITALS — BP 110/66 | HR 73 | Temp 97.6°F | Resp 18 | Ht 66.0 in | Wt 111.0 lb

## 2013-12-04 DIAGNOSIS — R1032 Left lower quadrant pain: Secondary | ICD-10-CM

## 2013-12-04 DIAGNOSIS — M4120 Other idiopathic scoliosis, site unspecified: Secondary | ICD-10-CM

## 2013-12-04 DIAGNOSIS — Z23 Encounter for immunization: Secondary | ICD-10-CM

## 2013-12-04 LAB — POCT CBC
Granulocyte percent: 55.4 %G (ref 37–80)
HCT, POC: 43.3 % (ref 37.7–47.9)
Hemoglobin: 13.9 g/dL (ref 12.2–16.2)
Lymph, poc: 1.8 (ref 0.6–3.4)
MCH, POC: 33.3 pg — AB (ref 27–31.2)
MCHC: 32.1 g/dL (ref 31.8–35.4)
MCV: 103.5 fL — AB (ref 80–97)
MID (cbc): 0.4 (ref 0–0.9)
MPV: 9.1 fL (ref 0–99.8)
POC Granulocyte: 2.7 (ref 2–6.9)
POC LYMPH PERCENT: 37.4 % (ref 10–50)
POC MID %: 7.2 % (ref 0–12)
Platelet Count, POC: 194 10*3/uL (ref 142–424)
RBC: 4.18 M/uL (ref 4.04–5.48)
RDW, POC: 13.1 %
WBC: 4.9 10*3/uL (ref 4.6–10.2)

## 2013-12-04 LAB — POCT URINALYSIS DIPSTICK
Bilirubin, UA: NEGATIVE
Blood, UA: NEGATIVE
Glucose, UA: NEGATIVE
Ketones, UA: NEGATIVE
Leukocytes, UA: NEGATIVE
Nitrite, UA: NEGATIVE
Protein, UA: NEGATIVE
Spec Grav, UA: 1.01
Urobilinogen, UA: 0.2
pH, UA: 6

## 2013-12-04 LAB — POCT UA - MICROSCOPIC ONLY
Casts, Ur, LPF, POC: NEGATIVE
Crystals, Ur, HPF, POC: NEGATIVE
Epithelial cells, urine per micros: NEGATIVE
Mucus, UA: NEGATIVE
WBC, Ur, HPF, POC: NEGATIVE
Yeast, UA: NEGATIVE

## 2013-12-04 MED ORDER — CIPROFLOXACIN HCL 500 MG PO TABS: 500.0000 mg | ORAL_TABLET | Freq: Two times a day (BID) | ORAL | Status: DC

## 2013-12-04 MED ORDER — METRONIDAZOLE 500 MG PO TABS: 500.0000 mg | ORAL_TABLET | Freq: Three times a day (TID) | ORAL | Status: DC

## 2013-12-04 NOTE — Progress Notes (Signed)
Chief Complaint:  Chief Complaint  Patient presents with  . Diverticulitis    on going issues    HPI: Kristine Davidson is a 62 y.o. female who is here for  1.5 week history of left sided abd pain, lower left side, feels like something is not "right", discomfort. She feels the same way prior when she was given abx and diagnosed with diverticulosis/lkitis. She denies any fevers, chills, n/v/diarrhea, melena, BRBPR Has been increasing her fiber sources.  Using power prune juice mixture , she has not had to strain but she states not everything is coming out She has had diverticular disease before but no acute infection on last CT scan. She was given meds. She was on cipro and flagyl in the past and had diarrhea , at some point her providers were worried about  C diff She denies constipation but feels it is not "regular."   She would also  like flu vaccine since she is here   Past Medical History  Diagnosis Date  . Depression   . Mitral valve prolapse   . Anemia   . Diverticular disease   . Thyroid disease   . HSV-2 (herpes simplex virus 2) infection   . Substance abuse   . Hyperlipidemia    Past Surgical History  Procedure Laterality Date  . Tonsillectomy    . D and c    . Varicose vein surgery  2000   History   Social History  . Marital Status: Divorced    Spouse Name: N/A    Number of Children: N/A  . Years of Education: N/A   Occupational History  . Teacher     Retired   Social History Main Topics  . Smoking status: Current Every Day Smoker -- 0.50 packs/day for 43 years    Types: Cigarettes  . Smokeless tobacco: None  . Alcohol Use: No  . Drug Use: No  . Sexual Activity: No   Other Topics Concern  . None   Social History Narrative   Lives alone.    Family History  Problem Relation Age of Onset  . Cancer Mother   . Stroke Mother   . Parkinson's disease Father   . Parkinson's disease Sister   . Diabetes Sister    Allergies  Allergen  Reactions  . Codeine Itching   Prior to Admission medications   Medication Sig Start Date End Date Taking? Authorizing Provider  cholecalciferol (VITAMIN D) 1000 UNITS tablet Take 2,000 Units by mouth daily.    Yes Historical Provider, MD  levothyroxine (SYNTHROID, LEVOTHROID) 50 MCG tablet Take 1 tablet (50 mcg total) by mouth daily. 08/25/13  Yes Maurice March, MD  PARoxetine (PAXIL) 20 MG tablet Take 1/2 tablet daily or as directed. 08/25/13  Yes Maurice March, MD  traZODone (DESYREL) 50 MG tablet TAKE ONE TABLET BY MOUTH AT BEDTIME 08/22/13  Yes Chelle S Jeffery, PA-C  valACYclovir (VALTREX) 1000 MG tablet Take 1/2 tablet daily or as directed. 08/25/13  Yes Maurice March, MD  vitamin B-12 (CYANOCOBALAMIN) 250 MCG tablet Take 250 mcg by mouth daily.   Yes Historical Provider, MD     ROS: The patient denies fevers, chills, night sweats, unintentional weight loss, chest pain, palpitations, wheezing, dyspnea on exertion, nausea, vomiting,  dysuria, hematuria, melena, numbness, weakness, or tingling.   All other systems have been reviewed and were otherwise negative with the exception of those mentioned in the HPI and as above.  PHYSICAL EXAM: Filed Vitals:   12/04/13 0942  BP: 110/66  Pulse: 73  Temp: 97.6 F (36.4 C)  Resp: 18   Filed Vitals:   12/04/13 0942  Height: 5\' 6"  (1.676 m)  Weight: 111 lb (50.349 kg)   Body mass index is 17.92 kg/(m^2).  General: Alert, no acute distress HEENT:  Normocephalic, atraumatic, oropharynx patent. EOMI, PERRLA Cardiovascular:  Regular rate and rhythm, no rubs murmurs or gallops.  No Carotid bruits, radial pulse intact. No pedal edema.  Respiratory: Clear to auscultation bilaterally.  No wheezes, rales, or rhonchi.  No cyanosis, no use of accessory musculature GI: No organomegaly, abdomen is soft and + minimally LLQ-tenderness, positive bowel sounds.  No masses. Skin: No rashes. Neurologic: Facial musculature  symmetric. Psychiatric: Patient is appropriate throughout our interaction. Lymphatic: No cervical lymphadenopathy Musculoskeletal: Gait intact. +left  flank pain   LABS: Results for orders placed in visit on 12/04/13  POCT CBC      Result Value Range   WBC 4.9  4.6 - 10.2 K/uL   Lymph, poc 1.8  0.6 - 3.4   POC LYMPH PERCENT 37.4  10 - 50 %L   MID (cbc) 0.4  0 - 0.9   POC MID % 7.2  0 - 12 %M   POC Granulocyte 2.7  2 - 6.9   Granulocyte percent 55.4  37 - 80 %G   RBC 4.18  4.04 - 5.48 M/uL   Hemoglobin 13.9  12.2 - 16.2 g/dL   HCT, POC 09.8  11.9 - 47.9 %   MCV 103.5 (*) 80 - 97 fL   MCH, POC 33.3 (*) 27 - 31.2 pg   MCHC 32.1  31.8 - 35.4 g/dL   RDW, POC 14.7     Platelet Count, POC 194  142 - 424 K/uL   MPV 9.1  0 - 99.8 fL  POCT UA - MICROSCOPIC ONLY      Result Value Range   WBC, Ur, HPF, POC neg     RBC, urine, microscopic 0-1     Bacteria, U Microscopic trace     Mucus, UA neg     Epithelial cells, urine per micros neg     Crystals, Ur, HPF, POC neg     Casts, Ur, LPF, POC neg     Yeast, UA neg    POCT URINALYSIS DIPSTICK      Result Value Range   Color, UA yellow     Clarity, UA clear     Glucose, UA neg     Bilirubin, UA neg     Ketones, UA neg     Spec Grav, UA 1.010     Blood, UA neg     pH, UA 6.0     Protein, UA neg     Urobilinogen, UA 0.2     Nitrite, UA neg     Leukocytes, UA Negative       EKG/XRAY:   Primary read interpreted by Dr. Conley Rolls at [facility]. No free air, no obstruction, nonspecific gas patterns + scoliosis   ASSESSMENT/PLAN: Encounter Diagnoses  Name Primary?  . Abdominal pain, left lower quadrant Yes  . Idiopathic scoliosis   . Flu vaccine need    Gave rx for cipro and flagyl but advise her not to use it unless she feels worse.  Will await for official radiology report  Try miralax  To move blwels, perhaps she has some heavy stool burden in rectal vault or gas.  F/u prn  Gross sideeffects, risk and benefits, and alternatives of  medications d/w patient. Patient is aware that all medications have potential sideeffects and we are unable to predict every sideeffect or drug-drug interaction that may occur.  Hamilton Capri PHUONG, DO 12/04/2013 12:45 PM

## 2013-12-04 NOTE — Patient Instructions (Signed)

## 2013-12-24 ENCOUNTER — Encounter: Payer: Self-pay | Admitting: Family Medicine

## 2013-12-24 DIAGNOSIS — G4700 Insomnia, unspecified: Secondary | ICD-10-CM

## 2013-12-25 NOTE — Telephone Encounter (Signed)
Pended order please advise

## 2014-01-05 ENCOUNTER — Encounter: Payer: Self-pay | Admitting: Family Medicine

## 2014-01-10 ENCOUNTER — Other Ambulatory Visit: Payer: Self-pay | Admitting: Family Medicine

## 2014-01-10 ENCOUNTER — Encounter: Payer: Self-pay | Admitting: Family Medicine

## 2014-01-10 DIAGNOSIS — G4700 Insomnia, unspecified: Secondary | ICD-10-CM

## 2014-01-10 MED ORDER — PAROXETINE HCL 20 MG PO TABS: ORAL_TABLET | ORAL | Status: DC

## 2014-01-10 NOTE — Telephone Encounter (Signed)
Spoke with patient. Dr L already called in meds and answered question for her.

## 2014-03-31 ENCOUNTER — Ambulatory Visit (INDEPENDENT_AMBULATORY_CARE_PROVIDER_SITE_OTHER): Payer: Medicare Other | Admitting: Family Medicine

## 2014-03-31 VITALS — BP 108/72 | HR 91 | Temp 98.6°F | Resp 16 | Ht 65.0 in | Wt 111.2 lb

## 2014-03-31 DIAGNOSIS — H6120 Impacted cerumen, unspecified ear: Secondary | ICD-10-CM

## 2014-03-31 DIAGNOSIS — F329 Major depressive disorder, single episode, unspecified: Secondary | ICD-10-CM

## 2014-03-31 DIAGNOSIS — F3289 Other specified depressive episodes: Secondary | ICD-10-CM

## 2014-03-31 DIAGNOSIS — F32A Depression, unspecified: Secondary | ICD-10-CM

## 2014-03-31 DIAGNOSIS — E039 Hypothyroidism, unspecified: Secondary | ICD-10-CM

## 2014-03-31 NOTE — Patient Instructions (Signed)
I will be in touch with your labs asap Take care!  

## 2014-03-31 NOTE — Progress Notes (Signed)
[facility] [address] [place] [address] [id number] [phone]  Date:  03/31/2014   Name:  Kristine Davidson   DOB:  Jan 07, 1951   MRN:  [id number]  PCP:  Robyn Haber, MD    Chief Complaint: Depression and Thyroid Problem   History of Present Illness:  Kristine Davidson is a 63 y.o. very pleasant female patient who presents with the following:  She is here today with concern about depression.  She wonders if her thyroid may be the issue.  She has noted feeling of low energy, sadness, for about 5 months if not longer.  She is having "wild dreams."  She does take trazadone at night which helps her to sleep.   She is having some crying spells.  Her sister died in 28-Jan-2023 of this year.  She of course is grieving this loss- they were very close and her sister suffered a lot with illness prior to her death.  She had been helping her sister with her care as she had Parkinson's disease.  She had been living her for about 5 years to help care for her.  She has had some problems with depress ion in the past.  She is also taking paxil.    She also is on synthroid for hypothyroid.   She is in counseling, AA and a support group.    "I know this is probably depression but want to rule out any physical causes first. "  She has also had anemia and B12 deficiency in the past.   Patient Active Problem List   Diagnosis Date Noted  . HSV-2 (herpes simplex virus 2) infection 03/31/2012  . B12 deficiency 03/31/2012  . Vitamin D deficiency disease 03/31/2012  . H/O diverticulitis of colon 02/17/2012  . Hypothyroid 02/17/2012    Past Medical History  Diagnosis Date  . Depression   . Mitral valve prolapse   . Anemia   . Diverticular disease   . Thyroid disease   . HSV-2 (herpes simplex virus 2) infection   . Substance abuse   . Hyperlipidemia     Past Surgical History  Procedure Laterality Date  . Tonsillectomy    . D and c    . Varicose vein surgery  2000    History   Substance Use Topics  . Smoking status: Current Every Day Smoker -- 0.50 packs/day for 43 years    Types: Cigarettes  . Smokeless tobacco: Not on file  . Alcohol Use: No    Family History  Problem Relation Age of Onset  . Cancer Mother   . Stroke Mother   . Parkinson's disease Father   . Parkinson's disease Sister   . Diabetes Sister     Allergies  Allergen Reactions  . Codeine Itching    Medication list has been reviewed and updated.  Current Outpatient Prescriptions on File Prior to Visit  Medication Sig Dispense Refill  . cholecalciferol (VITAMIN D) 1000 UNITS tablet Take 2,000 Units by mouth daily.       Marland Kitchen levothyroxine (SYNTHROID, LEVOTHROID) 50 MCG tablet Take 1 tablet (50 mcg total) by mouth daily.  90 tablet  3  . PARoxetine (PAXIL) 20 MG tablet qhs  90 tablet  2  . traZODone (DESYREL) 50 MG tablet TAKE ONE TABLET BY MOUTH AT BEDTIME  30 tablet  3  . valACYclovir (VALTREX) 1000 MG tablet Take 1/2 tablet daily or as directed.  30 tablet  5  . vitamin B-12 (CYANOCOBALAMIN) 250  MCG tablet Take 250 mcg by mouth daily.      . ciprofloxacin (CIPRO) 500 MG tablet Take 1 tablet (500 mg total) by mouth 2 (two) times daily.  20 tablet  0  . metroNIDAZOLE (FLAGYL) 500 MG tablet Take 1 tablet (500 mg total) by mouth 3 (three) times daily.  21 tablet  0   No current facility-administered medications on file prior to visit.    Review of Systems:  As per HPI- otherwise negative.   Physical Examination: Filed Vitals:   03/31/14 1732  BP: 108/72  Pulse: 91  Temp: 98.6 F (37 C)  Resp: 16   Filed Vitals:   03/31/14 1732  Height: 5\' 5"  (1.651 m)  Weight: 111 lb 3.2 oz (50.44 kg)   Body mass index is 18.5 kg/(m^2). Ideal Body Weight: Weight in (lb) to have BMI = 25: 149.9  GEN: WDWN, NAD, Non-toxic, A & O x 3, slim build, looks well HEENT: Atraumatic, Normocephalic. Neck supple. No masses, No LAD.  Right TM is obscured with cerumen- left is ok. oropharynx normal.   PEERL,EOMI.   Ears and Nose: No external deformity. CV: RRR, No M/G/R. No JVD. No thrill. No extra heart sounds. PULM: CTA B, no wheezes, crackles, rhonchi. No retractions. No resp. distress. No accessory muscle use. ABD: S, NT, ND, +BS. No rebound. No HSM. EXTR: No c/c/e NEURO Normal gait.  PSYCH: Normally interactive. Conversant. Not depressed or anxious appearing.  Calm demeanor.  Right cerumen impaction resolved with irrigation  Assessment and Plan: Depression - Plan: CBC with Differential, Comprehensive metabolic panel, Vitamin O96, Sedimentation Rate  Unspecified hypothyroidism - Plan: TSH, T3, Free  Cerumen impaction  Labs pending as above.  Kristine Davidson is mourning her sister, but is bothered by her sx of depression.  Will do labs as above and I will get back to her with results asap  Signed Lamar Blinks, MD

## 2014-04-01 LAB — CBC WITH DIFFERENTIAL/PLATELET
Basophils Absolute: 0 10*3/uL (ref 0.0–0.1)
Basophils Relative: 0 % (ref 0–1)
EOS ABS: 0 10*3/uL (ref 0.0–0.7)
Eosinophils Relative: 1 % (ref 0–5)
HCT: 34.1 % — ABNORMAL LOW (ref 36.0–46.0)
Hemoglobin: 12.4 g/dL (ref 12.0–15.0)
LYMPHS ABS: 1.3 10*3/uL (ref 0.7–4.0)
LYMPHS PCT: 33 % (ref 12–46)
MCH: 33.2 pg (ref 26.0–34.0)
MCHC: 36.4 g/dL — ABNORMAL HIGH (ref 30.0–36.0)
MCV: 91.4 fL (ref 78.0–100.0)
Monocytes Absolute: 0.3 10*3/uL (ref 0.1–1.0)
Monocytes Relative: 8 % (ref 3–12)
NEUTROS PCT: 58 % (ref 43–77)
Neutro Abs: 2.3 10*3/uL (ref 1.7–7.7)
PLATELETS: 201 10*3/uL (ref 150–400)
RBC: 3.73 MIL/uL — AB (ref 3.87–5.11)
RDW: 12.6 % (ref 11.5–15.5)
WBC: 4 10*3/uL (ref 4.0–10.5)

## 2014-04-01 LAB — COMPREHENSIVE METABOLIC PANEL
ALT: 12 U/L (ref 0–35)
AST: 17 U/L (ref 0–37)
Albumin: 4.2 g/dL (ref 3.5–5.2)
Alkaline Phosphatase: 64 U/L (ref 39–117)
BILIRUBIN TOTAL: 0.7 mg/dL (ref 0.2–1.2)
BUN: 16 mg/dL (ref 6–23)
CO2: 27 meq/L (ref 19–32)
Calcium: 9.3 mg/dL (ref 8.4–10.5)
Chloride: 103 mEq/L (ref 96–112)
Creat: 0.62 mg/dL (ref 0.50–1.10)
Glucose, Bld: 86 mg/dL (ref 70–99)
Potassium: 3.9 mEq/L (ref 3.5–5.3)
SODIUM: 139 meq/L (ref 135–145)
TOTAL PROTEIN: 6.2 g/dL (ref 6.0–8.3)

## 2014-04-01 LAB — TSH: TSH: 1.908 u[IU]/mL (ref 0.350–4.500)

## 2014-04-01 LAB — T3, FREE: T3, Free: 2.6 pg/mL (ref 2.3–4.2)

## 2014-04-01 LAB — VITAMIN B12: VITAMIN B 12: 1595 pg/mL — AB (ref 211–911)

## 2014-04-01 LAB — SEDIMENTATION RATE: Sed Rate: 6 mm/hr (ref 0–22)

## 2014-05-17 DIAGNOSIS — E039 Hypothyroidism, unspecified: Secondary | ICD-10-CM | POA: Insufficient documentation

## 2014-09-27 ENCOUNTER — Other Ambulatory Visit: Payer: Self-pay | Admitting: Family Medicine

## 2014-10-01 ENCOUNTER — Other Ambulatory Visit: Payer: Self-pay | Admitting: Family Medicine

## 2014-11-01 ENCOUNTER — Ambulatory Visit (INDEPENDENT_AMBULATORY_CARE_PROVIDER_SITE_OTHER): Payer: Medicare Other | Admitting: Family Medicine

## 2014-11-01 ENCOUNTER — Encounter: Payer: Self-pay | Admitting: Family Medicine

## 2014-11-01 VITALS — BP 108/78 | HR 75 | Temp 98.7°F | Resp 16 | Ht 65.75 in | Wt 109.2 lb

## 2014-11-01 DIAGNOSIS — L989 Disorder of the skin and subcutaneous tissue, unspecified: Secondary | ICD-10-CM

## 2014-11-01 DIAGNOSIS — Z23 Encounter for immunization: Secondary | ICD-10-CM

## 2014-11-01 DIAGNOSIS — E039 Hypothyroidism, unspecified: Secondary | ICD-10-CM

## 2014-11-01 MED ORDER — LEVOTHYROXINE SODIUM 50 MCG PO TABS: 50.0000 ug | ORAL_TABLET | Freq: Every day | ORAL | Status: DC

## 2014-11-01 MED ORDER — VALACYCLOVIR HCL 1 G PO TABS: ORAL_TABLET | ORAL | Status: DC

## 2014-11-01 MED ORDER — ZOSTER VACCINE LIVE 19400 UNT/0.65ML ~~LOC~~ SOLR: 0.6500 mL | Freq: Once | SUBCUTANEOUS | Status: DC

## 2014-11-01 NOTE — Patient Instructions (Signed)
I have referred you to Dr. Ray Church practice for evaluation of skin lesions.  I will see you in 4 months for physical and labs.

## 2014-11-01 NOTE — Progress Notes (Signed)
S:  This 63 y.o. Cauc female is here for evaluation of skin lesions on back and follow-up re: hypothyroidism. She had temporarily stopped taking Levothyroxine, became symptomatic w/ fatigue and shin/hair changes. She has recently resumed medication and plans to continue taking it. Last labs drawn in April and dose was therapeutic at that time.  Pt is in therapy and has several psychiatric medications which will be prescribed by that specialist.  She has some skin lesions on her back that she thinks may be changing. Skin lesions (benign) have been removed from her lower leg in the remote past. Diagnosed as "keratoses". No hx of skin cancer.  Patient Active Problem List   Diagnosis Date Noted  . HSV-2 (herpes simplex virus 2) infection 03/31/2012  . B12 deficiency 03/31/2012  . Vitamin D deficiency disease 03/31/2012  . H/O diverticulitis of colon 02/17/2012  . Hypothyroid 02/17/2012    Prior to Admission medications   Medication Sig Start Date End Date Taking? Authorizing Provider  buPROPion (WELLBUTRIN XL) 150 MG 24 hr tablet Take 150 mg by mouth daily.   Yes Historical Provider, MD  cholecalciferol (VITAMIN D) 1000 UNITS tablet Take 2,000 Units by mouth daily.    Yes Historical Provider, MD  levothyroxine (SYNTHROID, LEVOTHROID) 50 MCG tablet Take 1 tablet (50 mcg total) by mouth daily.   Yes Barton Fanny, MD  PARoxetine (PAXIL) 20 MG tablet qhs 01/10/14  Yes Robyn Haber, MD  PRESCRIPTION MEDICATION Wellbutrin XL 150 mg taking 1 tablet daily   Yes Historical Provider, MD  traZODone (DESYREL) 50 MG tablet TAKE ONE TABLET BY MOUTH AT BEDTIME 08/22/13  Yes Chelle S Jeffery, PA-C  valACYclovir (VALTREX) 1000 MG tablet TAKE 1/2 TABLET DAILY OR AS DIRECTED.   Yes Barton Fanny, MD  vitamin B-12 (CYANOCOBALAMIN) 250 MCG tablet Take 250 mcg by mouth daily.   Yes Historical Provider, MD    ROS: As per HPI; negative for abnormal weight change, fatigue, diaphoresis, vision  disturbances, CP or tightness, palpitations, SOB, edema, GI problems, myalgias, rashes, HA, dizziness, tremor, weakness or syncope.  O: Filed Vitals:   11/01/14 1124  BP: 108/78  Pulse: 75  Temp: 98.7 F (37.1 C)  Resp: 16    GEN: In NAD; WN,WD. HENT: [place]/AT; EOMI w/ clear conj/sclerae.  Otherwise unremarkable. COR: RRR. LUNGS: Unlabored resp. SKIN: Back- multiple smooth, round flesh colored lesions. Lower R- < 1 cm pigmented lesion (heterogenous pigmentation)/ slightly raised w/ irreg border. 2 other smaller pigmented lesions that appears benign. NEURO: A&O x 3; CNs intact. Nonfocal. PSYCH: Pleasant but anxious.  A/P: Hypothyroidism, unspecified hypothyroidism type - Plan: levothyroxine (SYNTHROID, LEVOTHROID) 50 MCG tablet  Skin lesions - Plan: Ambulatory referral to Dermatology  Flu vaccine need - Plan: Flu Vaccine QUAD 36+ mos IM  Need for shingles vaccine - Plan: zoster vaccine live, PF, (ZOSTAVAX) 16109 UNT/0.65ML injection  Meds ordered this encounter  Medications  . levothyroxine (SYNTHROID, LEVOTHROID) 50 MCG tablet    Sig: Take 1 tablet (50 mcg total) by mouth daily.    Dispense:  90 tablet    Refill:  1  . valACYclovir (VALTREX) 1000 MG tablet    Sig: TAKE 1/2 TABLET DAILY OR AS DIRECTED.    Dispense:  30 tablet    Refill:  5  . zoster vaccine live, PF, (ZOSTAVAX) 60454 UNT/0.65ML injection    Sig: Inject 19,400 Units into the skin once.    Dispense:  1 each    Refill:  0

## 2014-11-18 ENCOUNTER — Ambulatory Visit (INDEPENDENT_AMBULATORY_CARE_PROVIDER_SITE_OTHER): Payer: Medicare Other

## 2014-11-18 ENCOUNTER — Ambulatory Visit (INDEPENDENT_AMBULATORY_CARE_PROVIDER_SITE_OTHER): Payer: Medicare Other | Admitting: Family Medicine

## 2014-11-18 VITALS — BP 105/81 | HR 70 | Temp 98.8°F | Resp 18 | Wt 110.0 lb

## 2014-11-18 DIAGNOSIS — M25522 Pain in left elbow: Secondary | ICD-10-CM

## 2014-11-18 DIAGNOSIS — M7702 Medial epicondylitis, left elbow: Secondary | ICD-10-CM

## 2014-11-18 NOTE — Patient Instructions (Signed)
Medial Epicondylitis (Golfer's Elbow) with Rehab Medial epicondylitis involves inflammation and pain around the inner (medial) portion of the elbow. This pain is caused by inflammation of the tendons in the forearm that flex (bring down) the wrist. Medial epicondylitis is also called golfer's elbow, because it is common among golfers. However, it may occur in any individual who flexes the wrist regularly. If medial epicondylitis is left untreated, it may become a chronic problem. SYMPTOMS   Pain, tenderness, or inflammation over the inner (medial) side of the elbow.  Pain or weakness with gripping activities.  Pain that increases with wrist twisting motions (using a screwdriver, playing golf, bowling). CAUSES  Medial epicondylitis is caused by inflammation of the tendons that flex the wrist. Causes of injury may include:  Chronic, repetitive stress and strain to the tendons that run from the wrist and forearm to the elbow.  Sudden strain on the forearm, including wrist snap when serving balls with racquet sports, or throwing a baseball. RISK INCREASES WITH:  Sports or occupations that require repetitive and/or strenuous forearm and wrist movements (pitching a baseball, golfing, carpentry).  Poor wrist and forearm strength and flexibility.  Failure to warm up properly before activity.  Resuming activity before healing, rehabilitation, and conditioning are complete. PREVENTION   Warm up and stretch properly before activity.  Maintain physical fitness:  Strength, flexibility, and endurance.  Cardiovascular fitness.  Wear and use properly fitted equipment.  Learn and use proper technique and have a coach correct improper technique.  Wear a tennis elbow (counterforce) brace. PROGNOSIS  The course of this condition depends on the degree of the injury. If treated properly, acute cases (symptoms lasting less than 4 weeks) are often resolved in 2 to 6 weeks. Chronic (longer lasting  cases) often resolve in 3 to 6 months, but may require physical therapy. RELATED COMPLICATIONS   Frequently recurring symptoms, resulting in a chronic problem. Properly treating the problem the first time decreases frequency of recurrence.  Chronic inflammation, scarring, and partial tendon tear, requiring surgery.  Delayed healing or resolution of symptoms. TREATMENT  Treatment first involves the use of ice and medicine, to reduce pain and inflammation. Strengthening and stretching exercises may reduce discomfort, if performed regularly. These exercises may be performed at home, if the condition is an acute injury. Chronic cases may require a referral to a physical therapist for evaluation and treatment. Your caregiver may advise a corticosteroid injection to help reduce inflammation. Rarely, surgery is needed. MEDICATION  If pain medicine is needed, nonsteroidal anti-inflammatory medicines (aspirin and ibuprofen), or other minor pain relievers (acetaminophen), are often advised.  Do not take pain medicine for 7 days before surgery.  Prescription pain relievers may be given, if your caregiver thinks they are needed. Use only as directed and only as much as you need.  Corticosteroid injections may be recommended. These injections should be reserved only for the most severe cases, because they can only be given a certain number of times. HEAT AND COLD  Cold treatment (icing) should be applied for 10 to 15 minutes every 2 to 3 hours for inflammation and pain, and immediately after activity that aggravates your symptoms. Use ice packs or an ice massage.  Heat treatment may be used before performing stretching and strengthening activities prescribed by your caregiver, physical therapist, or athletic trainer. Use a heat pack or a warm water soak. SEEK MEDICAL CARE IF: Symptoms get worse or do not improve in 2 weeks, despite treatment. EXERCISES  RANGE OF MOTION (  ROM) AND STRETCHING EXERCISES -  Epicondylitis, Medial (Golfer's Elbow) These exercises may help you when beginning to rehabilitate your injury. Your symptoms may go away with or without further involvement from your physician, physical therapist or athletic trainer. While completing these exercises, remember:   Restoring tissue flexibility helps normal motion to return to the joints. This allows healthier, less painful movement and activity.  An effective stretch should be held for at least 30 seconds.  A stretch should never be painful. You should only feel a gentle lengthening or release in the stretched tissue. RANGE OF MOTION - Wrist Flexion, Active-Assisted  Extend your right / left elbow with your fingers pointing down.*  Gently pull the back of your hand towards you, until you feel a gentle stretch on the top of your forearm.  Hold this position for __________ seconds. Repeat __________ times. Complete this exercise __________ times per day.  *If directed by your physician, physical therapist or athletic trainer, complete this stretch with your elbow bent, rather than extended. RANGE OF MOTION - Wrist Extension, Active-Assisted  Extend your right / left elbow and turn your palm upwards.*  Gently pull your palm and fingertips back, so your wrist extends and your fingers point more toward the ground.  You should feel a gentle stretch on the inside of your forearm.  Hold this position for __________ seconds. Repeat __________ times. Complete this exercise __________ times per day. *If directed by your physician, physical therapist or athletic trainer, complete this stretch with your elbow bent, rather than extended. STRETCH - Wrist Extension   Place your right / left fingertips on a tabletop leaving your elbow slightly bent. Your fingers should point backwards.  Gently press your fingers and palm down onto the table, by straightening your elbow. You should feel a stretch on the inside of your forearm.  Hold  this position for __________ seconds. Repeat __________ times. Complete this stretch __________ times per day.  STRENGTHENING EXERCISES - Epicondylitis, Medial (Golfer's Elbow) These exercises may help you when beginning to rehabilitate your injury. They may resolve your symptoms with or without further involvement from your physician, physical therapist or athletic trainer. While completing these exercises, remember:   Muscles can gain both the endurance and the strength needed for everyday activities through controlled exercises.  Complete these exercises as instructed by your physician, physical therapist or athletic trainer. Increase the resistance and repetitions only as guided.  You may experience muscle soreness or fatigue, but the pain or discomfort you are trying to eliminate should never worsen during these exercises. If this pain does get worse, stop and make sure you are following the directions exactly. If the pain is still present after adjustments, discontinue the exercise until you can discuss the trouble with your caregiver. STRENGTH - Wrist Flexors  Sit with your right / left forearm palm-up, and fully supported on a table or countertop. Your elbow should be resting below the height of your shoulder. Allow your wrist to extend over the edge of the surface.  Loosely holding a __________ weight, or a piece of rubber exercise band or tubing, slowly curl your hand up toward your forearm.  Hold this position for __________ seconds. Slowly lower the wrist back to the starting position in a controlled manner. Repeat __________ times. Complete this exercise __________ times per day.  STRENGTH - Wrist Extensors  Sit with your right / left forearm palm-down and fully supported. Your elbow should be resting below the height of your shoulder.   Allow your wrist to extend over the edge of the surface.  Loosely holding a __________ weight, or a piece of rubber exercise band or tubing, slowly  curl your hand up toward your forearm.  Hold this position for __________ seconds. Slowly lower the wrist back to the starting position in a controlled manner. Repeat __________ times. Complete this exercise __________ times per day.  STRENGTH - Ulnar Deviators  Stand with a ____________________ weight in your right / left hand, or sit while holding a rubber exercise band or tubing, with your healthy arm supported on a table or countertop.  Move your wrist so that your pinkie travels toward your forearm and your thumb moves away from your forearm.  Hold this position for __________ seconds and then slowly lower the wrist back to the starting position. Repeat __________ times. Complete this exercise __________ times per day STRENGTH - Grip   Grasp a tennis ball, a dense sponge, or a large, rolled sock in your hand.  Squeeze as hard as you can, without increasing any pain.  Hold this position for __________ seconds. Release your grip slowly. Repeat __________ times. Complete this exercise __________ times per day.  STRENGTH - Forearm Supinators   Sit with your right / left forearm supported on a table, keeping your elbow below shoulder height. Rest your hand over the edge, palm down.  Gently grip a hammer or a soup ladle.  Without moving your elbow, slowly turn your palm and hand upward to a "thumbs-up" position.  Hold this position for __________ seconds. Slowly return to the starting position. Repeat __________ times. Complete this exercise __________ times per day.  STRENGTH - Forearm Pronators  Sit with your right / left forearm supported on a table, keeping your elbow below shoulder height. Rest your hand over the edge, palm up.  Gently grip a hammer or a soup ladle.  Without moving your elbow, slowly turn your palm and hand upward to a "thumbs-up" position.  Hold this position for __________ seconds. Slowly return to the starting position. Repeat __________ times. Complete  this exercise __________ times per day.  Document Released: 12/07/2005 Document Revised: 02/29/2012 Document Reviewed: 03/21/2009 [employer] Patient Information 2015 [employer]. This information is not intended to replace advice given to you by your health care provider. Make sure you discuss any questions you have with your health care provider.  

## 2014-11-18 NOTE — Progress Notes (Signed)
Chief Complaint:  Chief Complaint  Patient presents with  . Elbow Pain    left    HPI: Kristine Davidson is a 63 y.o. female who is here for  6 month hx of elbow pain, mostly on her medial side, she has no know injury, it was intermittent and now more constant, sharp and dull, som enumbness, she does nto do a lot of repetitive motion/ She has ahd no fevers, chills, swelling, redness. She states that when she turn her forearm it hurts  Past Medical History  Diagnosis Date  . Depression   . Mitral valve prolapse   . Anemia   . Diverticular disease   . Thyroid disease   . HSV-2 (herpes simplex virus 2) infection   . Substance abuse   . Hyperlipidemia    Past Surgical History  Procedure Laterality Date  . Tonsillectomy    . D and c    . Varicose vein surgery  2000   History   Social History  . Marital Status: Divorced    Spouse Name: N/A    Number of Children: N/A  . Years of Education: N/A   Occupational History  . Teacher     Retired   Social History Main Topics  . Smoking status: Current Every Day Smoker -- 0.50 packs/day for 43 years    Types: Cigarettes  . Smokeless tobacco: None  . Alcohol Use: No  . Drug Use: No  . Sexual Activity: No   Other Topics Concern  . None   Social History Narrative   Lives alone.    Family History  Problem Relation Age of Onset  . Cancer Mother   . Stroke Mother   . Parkinson's disease Father   . Parkinson's disease Sister   . Diabetes Sister    Allergies  Allergen Reactions  . Codeine Itching   Prior to Admission medications   Medication Sig Start Date End Date Taking? Authorizing Provider  buPROPion (WELLBUTRIN XL) 150 MG 24 hr tablet Take 150 mg by mouth daily.   Yes Historical Provider, MD  cholecalciferol (VITAMIN D) 1000 UNITS tablet Take 2,000 Units by mouth daily.    Yes Historical Provider, MD  levothyroxine (SYNTHROID, LEVOTHROID) 50 MCG tablet Take 1 tablet (50 mcg total) by mouth daily. 11/01/14   Yes Barton Fanny, MD  PARoxetine (PAXIL) 20 MG tablet qhs 01/10/14  Yes Robyn Haber, MD  PRESCRIPTION MEDICATION Wellbutrin XL 150 mg taking 1 tablet daily   Yes Historical Provider, MD  traZODone (DESYREL) 50 MG tablet TAKE ONE TABLET BY MOUTH AT BEDTIME 08/22/13  Yes Chelle S Jeffery, PA-C  valACYclovir (VALTREX) 1000 MG tablet TAKE 1/2 TABLET DAILY OR AS DIRECTED. 11/01/14  Yes Barton Fanny, MD  vitamin B-12 (CYANOCOBALAMIN) 250 MCG tablet Take 250 mcg by mouth daily.    Historical Provider, MD     ROS: The patient denies fevers, chills, night sweats, unintentional weight loss, chest pain, palpitations, wheezing, dyspnea on exertion, nausea, vomiting, abdominal pain, dysuria, hematuria, melena, , weakness  All other systems have been reviewed and were otherwise negative with the exception of those mentioned in the HPI and as above.    PHYSICAL EXAM: Filed Vitals:   11/18/14 0921  BP: 105/81  Pulse: 70  Temp: 98.8 F (37.1 C)  Resp: 18   Filed Vitals:   11/18/14 0921  Weight: 110 lb (49.896 kg)   Body mass index is 17.89 kg/(m^2).  General: Alert, no  acute distress HEENT:  Normocephalic, atraumatic, oropharynx patent. EOMI, PERRLA Cardiovascular:  Regular rate and rhythm, no rubs, gallops.  No Carotid bruits, radial pulse intact. No pedal edema.  Respiratory: Clear to auscultation bilaterally.  No wheezes, rales, or rhonchi.  No cyanosis, no use of accessory musculature GI: No organomegaly, abdomen is soft and non-tender, positive bowel sounds.  No masses. Skin: No rashes. Neurologic: Facial musculature symmetric. Psychiatric: Patient is appropriate throughout our interaction. Lymphatic: No cervical lymphadenopathy Musculoskeletal: Gait intact. Full ROM, Pain with resistance at medial epicondyle  Neg Tinels, no defomrities, no swelling, no erythema no warmth DTRs nl  Neck and shoudler exam normal   LABS: Results for orders placed or performed in visit on  03/31/14  TSH  Result Value Ref Range   TSH 1.908 0.350 - 4.500 uIU/mL  CBC with Differential  Result Value Ref Range   WBC 4.0 4.0 - 10.5 K/uL   RBC 3.73 (L) 3.87 - 5.11 MIL/uL   Hemoglobin 12.4 12.0 - 15.0 g/dL   HCT 34.1 (L) 36.0 - 46.0 %   MCV 91.4 78.0 - 100.0 fL   MCH 33.2 26.0 - 34.0 pg   MCHC 36.4 (H) 30.0 - 36.0 g/dL   RDW 12.6 11.5 - 15.5 %   Platelets 201 150 - 400 K/uL   Neutrophils Relative % 58 43 - 77 %   Neutro Abs 2.3 1.7 - 7.7 K/uL   Lymphocytes Relative 33 12 - 46 %   Lymphs Abs 1.3 0.7 - 4.0 K/uL   Monocytes Relative 8 3 - 12 %   Monocytes Absolute 0.3 0.1 - 1.0 K/uL   Eosinophils Relative 1 0 - 5 %   Eosinophils Absolute 0.0 0.0 - 0.7 K/uL   Basophils Relative 0 0 - 1 %   Basophils Absolute 0.0 0.0 - 0.1 K/uL   Smear Review Criteria for review not met   Comprehensive metabolic panel  Result Value Ref Range   Sodium 139 135 - 145 mEq/L   Potassium 3.9 3.5 - 5.3 mEq/L   Chloride 103 96 - 112 mEq/L   CO2 27 19 - 32 mEq/L   Glucose, Bld 86 70 - 99 mg/dL   BUN 16 6 - 23 mg/dL   Creat 0.62 0.50 - 1.10 mg/dL   Total Bilirubin 0.7 0.2 - 1.2 mg/dL   Alkaline Phosphatase 64 39 - 117 U/L   AST 17 0 - 37 U/L   ALT 12 0 - 35 U/L   Total Protein 6.2 6.0 - 8.3 g/dL   Albumin 4.2 3.5 - 5.2 g/dL   Calcium 9.3 8.4 - 10.5 mg/dL  Vitamin B12  Result Value Ref Range   Vitamin B-12 1595 (H) 211 - 911 pg/mL  T3, Free  Result Value Ref Range   T3, Free 2.6 2.3 - 4.2 pg/mL  Sedimentation Rate  Result Value Ref Range   Sed Rate 6 0 - 22 mm/hr     EKG/XRAY:   Primary read interpreted by Dr. Marin Comment at [facility]. Neg fr fractures or dilocation   ASSESSMENT/PLAN: Encounter Diagnoses  Name Primary?  . Left elbow pain Yes  . Medial epicondylitis of elbow, left    She did try Aleve and di help She does not want to use it often so would like alternatives Elbow sleeve for compressions, exrecises Arnica gel, tumeric , bengay , etc.  F/u prn  Gross sideeffects, risk and  benefits, and alternatives of medications d/w patient. Patient is aware that all medications have  potential sideeffects and we are unable to predict every sideeffect or drug-drug interaction that may occur.  LE, THAO PHUONG, DO 11/18/2014 10:26 AM     

## 2014-12-05 ENCOUNTER — Other Ambulatory Visit: Payer: Self-pay | Admitting: Physician Assistant

## 2015-02-09 ENCOUNTER — Ambulatory Visit (INDEPENDENT_AMBULATORY_CARE_PROVIDER_SITE_OTHER): Payer: Medicare Other | Admitting: Emergency Medicine

## 2015-02-09 VITALS — BP 102/70 | HR 67 | Temp 97.8°F | Resp 16 | Ht 66.0 in | Wt 107.5 lb

## 2015-02-09 DIAGNOSIS — H00016 Hordeolum externum left eye, unspecified eyelid: Secondary | ICD-10-CM

## 2015-02-09 MED ORDER — POLYMYXIN B-TRIMETHOPRIM 10000-0.1 UNIT/ML-% OP SOLN: 2.0000 [drp] | OPHTHALMIC | Status: DC

## 2015-02-09 NOTE — Patient Instructions (Signed)

## 2015-02-09 NOTE — Progress Notes (Signed)
[facility] [address], [place] [address] [phone]  Date:  02/09/2015   Name:  Kristine Davidson   DOB:  03-12-1951   MRN:  [id number]  PCP:  Robyn Haber, MD    Chief Complaint: Eye Infection   History of Present Illness:  Kristine Davidson is a 64 y.o. very pleasant female patient who presents with the following:  Yesterday noticed discomfort in left eye.  Noticed a lump on the lid Some fullness.   No drainage No fever or chills No injury or overuse No visual symptoms Denies other complaint or health concern today.    Patient Active Problem List   Diagnosis Date Noted  . HSV-2 (herpes simplex virus 2) infection 03/31/2012  . B12 deficiency 03/31/2012  . Vitamin D deficiency disease 03/31/2012  . H/O diverticulitis of colon 02/17/2012  . Hypothyroid 02/17/2012    Past Medical History  Diagnosis Date  . Depression   . Mitral valve prolapse   . Anemia   . Diverticular disease   . Thyroid disease   . HSV-2 (herpes simplex virus 2) infection   . Substance abuse   . Hyperlipidemia     Past Surgical History  Procedure Laterality Date  . Tonsillectomy    . D and c    . Varicose vein surgery  2000    History  Substance Use Topics  . Smoking status: Current Every Day Smoker -- 0.50 packs/day for 43 years    Types: Cigarettes  . Smokeless tobacco: Never Used  . Alcohol Use: No    Family History  Problem Relation Age of Onset  . Cancer Mother   . Stroke Mother   . Parkinson's disease Father   . Parkinson's disease Sister   . Diabetes Sister     Allergies  Allergen Reactions  . Codeine Itching    Medication list has been reviewed and updated.  Current Outpatient Prescriptions on File Prior to Visit  Medication Sig Dispense Refill  . buPROPion (WELLBUTRIN XL) 150 MG 24 hr tablet Take 150 mg by mouth daily.    . cholecalciferol (VITAMIN D) 1000 UNITS tablet Take 2,000 Units by mouth daily.     Marland Kitchen levothyroxine  (SYNTHROID, LEVOTHROID) 50 MCG tablet Take 1 tablet (50 mcg total) by mouth daily. 90 tablet 1  . PARoxetine (PAXIL) 20 MG tablet qhs 90 tablet 2  . traZODone (DESYREL) 50 MG tablet TAKE ONE TABLET BY MOUTH AT BEDTIME 30 tablet 3  . valACYclovir (VALTREX) 1000 MG tablet TAKE 1/2 TABLET DAILY OR AS DIRECTED. 30 tablet 5  . vitamin B-12 (CYANOCOBALAMIN) 250 MCG tablet Take 250 mcg by mouth daily.     No current facility-administered medications on file prior to visit.    Review of Systems:  As per HPI, otherwise negative.    Physical Examination: Filed Vitals:   02/09/15 1102  BP: 102/70  Pulse: 67  Temp: 97.8 F (36.6 C)  Resp: 16   Filed Vitals:   02/09/15 1102  Height: 5\' 6"  (1.676 m)  Weight: 107 lb 8 oz (48.762 kg)   Body mass index is 17.36 kg/(m^2). Ideal Body Weight: Weight in (lb) to have BMI = 25: 154.6   GEN: WDWN, NAD, Non-toxic, Alert & Oriented x 3 HEENT: Atraumatic, Normocephalic.   PRRERLA EOMI conjunctiva and sclera clear.  Left lower lid stye  Ears and Nose: No external deformity. EXTR: No clubbing/cyanosis/edema NEURO: Normal gait.  PSYCH: Normally interactive. Conversant. Not depressed or anxious  appearing.  Calm demeanor.    Assessment and Plan: Hordeolum trimeth drops  Signed,  Ellison Carwin, MD

## 2015-02-28 ENCOUNTER — Encounter: Payer: Self-pay | Admitting: Family Medicine

## 2015-02-28 ENCOUNTER — Ambulatory Visit (INDEPENDENT_AMBULATORY_CARE_PROVIDER_SITE_OTHER): Payer: Medicare Other | Admitting: Family Medicine

## 2015-02-28 VITALS — BP 123/71 | HR 68 | Resp 16 | Ht 66.25 in | Wt 108.0 lb

## 2015-02-28 DIAGNOSIS — F418 Other specified anxiety disorders: Secondary | ICD-10-CM | POA: Diagnosis not present

## 2015-02-28 DIAGNOSIS — Z79899 Other long term (current) drug therapy: Secondary | ICD-10-CM

## 2015-02-28 DIAGNOSIS — K5730 Diverticulosis of large intestine without perforation or abscess without bleeding: Secondary | ICD-10-CM

## 2015-02-28 DIAGNOSIS — Z1212 Encounter for screening for malignant neoplasm of rectum: Secondary | ICD-10-CM | POA: Diagnosis not present

## 2015-02-28 DIAGNOSIS — Z862 Personal history of diseases of the blood and blood-forming organs and certain disorders involving the immune mechanism: Secondary | ICD-10-CM | POA: Diagnosis not present

## 2015-02-28 DIAGNOSIS — Z1211 Encounter for screening for malignant neoplasm of colon: Secondary | ICD-10-CM | POA: Diagnosis not present

## 2015-02-28 DIAGNOSIS — E039 Hypothyroidism, unspecified: Secondary | ICD-10-CM | POA: Diagnosis not present

## 2015-02-28 DIAGNOSIS — Z0000 Encounter for general adult medical examination without abnormal findings: Secondary | ICD-10-CM

## 2015-02-28 DIAGNOSIS — Z13220 Encounter for screening for lipoid disorders: Secondary | ICD-10-CM

## 2015-02-28 LAB — COMPLETE METABOLIC PANEL WITH GFR
ALT: 13 U/L (ref 0–35)
AST: 16 U/L (ref 0–37)
Albumin: 4.1 g/dL (ref 3.5–5.2)
Alkaline Phosphatase: 64 U/L (ref 39–117)
BUN: 8 mg/dL (ref 6–23)
CHLORIDE: 103 meq/L (ref 96–112)
CO2: 31 meq/L (ref 19–32)
Calcium: 9 mg/dL (ref 8.4–10.5)
Creat: 0.63 mg/dL (ref 0.50–1.10)
Glucose, Bld: 78 mg/dL (ref 70–99)
Potassium: 4 mEq/L (ref 3.5–5.3)
Sodium: 140 mEq/L (ref 135–145)
Total Bilirubin: 0.5 mg/dL (ref 0.2–1.2)
Total Protein: 6.2 g/dL (ref 6.0–8.3)

## 2015-02-28 LAB — CBC WITH DIFFERENTIAL/PLATELET
BASOS ABS: 0 10*3/uL (ref 0.0–0.1)
Basophils Relative: 0 % (ref 0–1)
EOS PCT: 1 % (ref 0–5)
Eosinophils Absolute: 0 10*3/uL (ref 0.0–0.7)
HCT: 36.4 % (ref 36.0–46.0)
HEMOGLOBIN: 12.7 g/dL (ref 12.0–15.0)
LYMPHS ABS: 1.1 10*3/uL (ref 0.7–4.0)
Lymphocytes Relative: 42 % (ref 12–46)
MCH: 32.6 pg (ref 26.0–34.0)
MCHC: 34.9 g/dL (ref 30.0–36.0)
MCV: 93.3 fL (ref 78.0–100.0)
MONO ABS: 0.3 10*3/uL (ref 0.1–1.0)
MPV: 9.8 fL (ref 8.6–12.4)
Monocytes Relative: 10 % (ref 3–12)
Neutro Abs: 1.2 10*3/uL — ABNORMAL LOW (ref 1.7–7.7)
Neutrophils Relative %: 47 % (ref 43–77)
PLATELETS: 184 10*3/uL (ref 150–400)
RBC: 3.9 MIL/uL (ref 3.87–5.11)
RDW: 13 % (ref 11.5–15.5)
WBC: 2.5 10*3/uL — ABNORMAL LOW (ref 4.0–10.5)

## 2015-02-28 LAB — THYROID PANEL WITH TSH
FREE THYROXINE INDEX: 2 (ref 1.4–3.8)
T3 UPTAKE: 29 % (ref 22–35)
T4 TOTAL: 6.8 ug/dL (ref 4.5–12.0)
TSH: 2.603 u[IU]/mL (ref 0.350–4.500)

## 2015-02-28 LAB — LIPID PANEL
Cholesterol: 162 mg/dL (ref 0–200)
HDL: 80 mg/dL (ref >=46)
LDL Cholesterol: 73 mg/dL (ref 0–99)
Total CHOL/HDL Ratio: 2 Ratio
Triglycerides: 47 mg/dL (ref <150)
VLDL: 9 mg/dL (ref 0–40)

## 2015-02-28 LAB — POCT URINALYSIS DIPSTICK
Bilirubin, UA: NEGATIVE
Glucose, UA: NEGATIVE
Ketones, UA: NEGATIVE
Leukocytes, UA: NEGATIVE
Nitrite, UA: NEGATIVE
RBC UA: NEGATIVE
Spec Grav, UA: 1.015
UROBILINOGEN UA: 0.2
pH, UA: 7

## 2015-02-28 MED ORDER — VALACYCLOVIR HCL 1 G PO TABS: ORAL_TABLET | ORAL | Status: DC

## 2015-02-28 NOTE — Patient Instructions (Addendum)
Keeping You Healthy  Get These Tests  Blood Pressure- Have your blood pressure checked by your healthcare provider at least once a year.  Normal blood pressure is 120/80.  Weight- Have your body mass index (BMI) calculated to screen for obesity.  BMI is a measure of body fat based on height and weight.  You can calculate your own BMI at www.nhlbisupport.com/bmi/  Cholesterol- Have your cholesterol checked every year.  Diabetes- Have your blood sugar checked every year if you have high blood pressure, high cholesterol, a family history of diabetes or if you are overweight.  Pap Smear- Have a pap smear every 1 to 3 years if you have been sexually active.  If you are older than 65 and recent pap smears have been normal you may not need additional pap smears.  In addition, if you have had a hysterectomy  For benign disease additional pap smears are not necessary.  Mammogram-Yearly mammograms are essential for early detection of breast cancer  Screening for Colon Cancer- Colonoscopy starting at age 50. Screening may begin sooner depending on your family history and other health conditions.  Follow up colonoscopy as directed by your Gastroenterologist.  Screening for Osteoporosis- Screening begins at age 65 with bone density scanning, sooner if you are at higher risk for developing Osteoporosis.  Get these medicines  Calcium with Vitamin D- Your body requires 1200-1500 mg of Calcium a day and 800-1000 IU of Vitamin D a day.  You can only absorb 500 mg of Calcium at a time therefore Calcium must be taken in 2 or 3 separate doses throughout the day.  Hormones- Hormone therapy has been associated with increased risk for certain cancers and heart disease.  Talk to your healthcare provider about if you need relief from menopausal symptoms.  Aspirin- Ask your healthcare provider about taking Aspirin to prevent Heart Disease and Stroke.  Get these Immuniztions  Flu shot- Every fall  Pneumonia  shot- Once after the age of 65; if you are younger ask your healthcare provider if you need a pneumonia shot.  Tetanus- Every ten years.  Zostavax- Once after the age of 60 to prevent shingles.  Take these steps  Don't smoke- Your healthcare provider can help you quit. For tips on how to quit, ask your healthcare provider or go to www.smokefree.gov or call 1-800 QUIT-NOW.  Be physically active- Exercise 5 days a week for a minimum of 30 minutes.  If you are not already physically active, start slow and gradually work up to 30 minutes of moderate physical activity.  Try walking, dancing, bike riding, swimming, etc.  Eat a healthy diet- Eat a variety of healthy foods such as fruits, vegetables, whole grains, low fat milk, low fat cheeses, yogurt, lean meats, chicken, fish, eggs, dried beans, tofu, etc.  For more information go to www.thenutritionsource.org  Dental visit- Brush and floss teeth twice daily; visit your dentist twice a year.  Eye exam- Visit your Optometrist or Ophthalmologist yearly.  Drink alcohol in moderation- Limit alcohol intake to one drink or less a day.  Never drink and drive.  Depression- Your emotional health is as important as your physical health.  If you're feeling down or losing interest in things you normally enjoy, please talk to your healthcare provider.  Seat Belts- can save your life; always wear one  Smoke/Carbon Monoxide detectors- These detectors need to be installed on the appropriate level of your home.  Replace batteries at least once a year.  Violence- If anyone   is threatening or hurting you, please tell your healthcare provider.  Living Will/ Health care power of attorney- Discuss with your healthcare provider and family.       Mediterranean Diet  Why follow it? Research shows. . Those who follow the Mediterranean diet have a reduced risk of heart disease  . The diet is associated with a reduced incidence of Parkinson's and Alzheimer's  diseases . People following the diet may have longer life expectancies and lower rates of chronic diseases  . The Dietary Guidelines for Americans recommends the Mediterranean diet as an eating plan to promote health and prevent disease  What Is the Mediterranean Diet?  . Healthy eating plan based on typical foods and recipes of Mediterranean-style cooking . The diet is primarily a plant based diet; these foods should make up a majority of meals   Starches - Plant based foods should make up a majority of meals - They are an important sources of vitamins, minerals, energy, antioxidants, and fiber - Choose whole grains, foods high in fiber and minimally processed items  - Typical grain sources include wheat, oats, barley, corn, brown rice, bulgar, farro, millet, polenta, couscous  - Various types of beans include chickpeas, lentils, fava beans, black beans, white beans   Fruits  Veggies - Large quantities of antioxidant rich fruits & veggies; 6 or more servings  - Vegetables can be eaten raw or lightly drizzled with oil and cooked  - Vegetables common to the traditional Mediterranean Diet include: artichokes, arugula, beets, broccoli, brussel sprouts, cabbage, carrots, celery, collard greens, cucumbers, eggplant, kale, leeks, lemons, lettuce, mushrooms, okra, onions, peas, peppers, potatoes, pumpkin, radishes, rutabaga, shallots, spinach, sweet potatoes, turnips, zucchini - Fruits common to the Mediterranean Diet include: apples, apricots, avocados, cherries, clementines, dates, figs, grapefruits, grapes, melons, nectarines, oranges, peaches, pears, pomegranates, strawberries, tangerines  Fats - Replace butter and margarine with healthy oils, such as olive oil, canola oil, and tahini  - Limit nuts to no more than a handful a day  - Nuts include walnuts, almonds, pecans, pistachios, pine nuts  - Limit or avoid candied, honey roasted or heavily salted nuts - Olives are central to the [employer] - can be eaten whole or used in a variety of dishes   Meats Protein - Limiting red meat: no more than a few times a month - When eating red meat: choose lean cuts and keep the portion to the size of deck of cards - Eggs: approx. 0 to 4 times a week  - Fish and lean poultry: at least 2 a week  - Healthy protein sources include, chicken, [place], lean beef, lamb - Increase intake of seafood such as tuna, salmon, trout, mackerel, shrimp, scallops - Avoid or limit high fat processed meats such as sausage and bacon  Dairy - Include moderate amounts of low fat dairy products  - Focus on healthy dairy such as fat free yogurt, skim milk, low or reduced fat cheese - Limit dairy products higher in fat such as whole or 2% milk, cheese, ice cream  Alcohol - Moderate amounts of red wine is ok  - No more than 5 oz daily for women (all ages) and men older than age 25  - No more than 10 oz of wine daily for men younger than 34  Other - Limit sweets and other desserts  - Use herbs and spices instead of salt to flavor foods  - Herbs and spices common to the traditional Mediterranean Diet include: basil,  bay leaves, chives, cloves, cumin, fennel, garlic, lavender, marjoram, mint, oregano, parsley, pepper, rosemary, sage, savory, sumac, tarragon, thyme   It's not just a diet, it's a lifestyle:  . The Mediterranean diet includes lifestyle factors typical of those in the region  . Foods, drinks and meals are best eaten with others and savored . Daily physical activity is important for overall good health . This could be strenuous exercise like running and aerobics . This could also be more leisurely activities such as walking, housework, yard-work, or taking the stairs . Moderation is the key; a balanced and healthy diet accommodates most foods and drinks . Consider portion sizes and frequency of consumption of certain foods   Meal Ideas & Options:  . Breakfast:  o Whole wheat toast or whole wheat English  muffins with peanut butter & hard boiled egg o Steel cut oats topped with apples & cinnamon and skim milk  o Fresh fruit: banana, strawberries, melon, berries, peaches  o Smoothies: strawberries, bananas, greek yogurt, peanut butter o Low fat greek yogurt with blueberries and granola  o Egg white omelet with spinach and mushrooms o Breakfast couscous: whole wheat couscous, apricots, skim milk, cranberries  . Sandwiches:  o Hummus and grilled vegetables (peppers, zucchini, squash) on whole wheat bread   o Grilled chicken on whole wheat pita with lettuce, tomatoes, cucumbers or tzatziki  o Tuna salad on whole wheat bread: tuna salad made with greek yogurt, olives, red peppers, capers, green onions o Garlic rosemary lamb pita: lamb sauted with garlic, rosemary, salt & pepper; add lettuce, cucumber, greek yogurt to pita - flavor with lemon juice and black pepper  . Seafood:  o Mediterranean grilled salmon, seasoned with garlic, basil, parsley, lemon juice and black pepper o Shrimp, lemon, and spinach whole-grain pasta salad made with low fat greek yogurt  o Seared scallops with lemon orzo  o Seared tuna steaks seasoned salt, pepper, coriander topped with tomato mixture of olives, tomatoes, olive oil, minced garlic, parsley, green onions and cappers  . Meats:  o Herbed greek chicken salad with kalamata olives, cucumber, feta  o Red bell peppers stuffed with spinach, bulgur, lean ground beef (or lentils) & topped with feta   o Kebabs: skewers of chicken, tomatoes, onions, zucchini, squash  o [place] burgers: made with red onions, mint, dill, lemon juice, feta cheese topped with roasted red peppers . Vegetarian o Cucumber salad: cucumbers, artichoke hearts, celery, red onion, feta cheese, tossed in olive oil & lemon juice  o Hummus and whole grain pita points with a greek salad (lettuce, tomato, feta, olives, cucumbers, red onion) o Lentil soup with celery, carrots made with vegetable broth,  garlic, salt and pepper  o Tabouli salad: parsley, bulgur, mint, scallions, cucumbers, tomato, radishes, lemon juice, olive oil, salt and pepper.    Thyroid levels should be checked every 6 months or more often if a change is made in medication dose.  Take care and contact the clinic if you have questions or concerns.  Get Ocuvite or some other eye care supplement to help prevent eye disease.

## 2015-02-28 NOTE — Progress Notes (Signed)
Subjective:    Patient ID: Kristine Davidson, female    DOB: 1951-10-30, 64 y.o.   MRN: [id number]  HPI This 64 y.o. Female is here for [facility] Initial Preventative exam. She has hypothyroidism and chronic anxiety with depression. Chronic medications for mental health disorder are prescribed by her therapist. She has recurrent HSV-2, treated prn w/ valacyclovir; outbreaks are occuring almost monthly.  HCM: PAP- 2014 (negtaive).           MMG- Pt to contact [facility] to schedule.           IMM- Current; not sure about getting Zostavax w/ HSV-2 infection.           CRS- 2010 per pt hx (in [place]); needs GI referral.           Vision- Annually w/ optometrist.           Dental- Periodically.   Patient Active Problem List   Diagnosis Date Noted  . HSV-2 (herpes simplex virus 2) infection 03/31/2012  . B12 deficiency 03/31/2012  . Vitamin D deficiency disease 03/31/2012  . H/O diverticulitis of colon 02/17/2012  . Hypothyroid 02/17/2012    Prior to Admission medications   Medication Sig Start Date End Date Taking? Authorizing Provider  buPROPion (WELLBUTRIN XL) 150 MG 24 hr tablet Take 150 mg by mouth daily.   Yes Historical Provider, MD  cholecalciferol (VITAMIN D) 1000 UNITS tablet Take 2,000 Units by mouth daily.    Yes Historical Provider, MD  levothyroxine (SYNTHROID, LEVOTHROID) 50 MCG tablet Take 1 tablet (50 mcg total) by mouth daily. 11/01/14  Yes Barton Fanny, MD  PARoxetine (PAXIL) 20 MG tablet qhs 01/10/14  Yes Robyn Haber, MD  traZODone (DESYREL) 50 MG tablet TAKE ONE TABLET BY MOUTH AT BEDTIME 08/22/13  Yes Chelle S Jeffery, PA-C  valACYclovir (VALTREX) 1000 MG tablet TAKE 1/2 TABLET DAILY OR AS DIRECTED. 11/01/14  Yes Barton Fanny, MD  vitamin B-12 (CYANOCOBALAMIN) 250 MCG tablet Take 250 mcg by mouth daily.   Yes Historical Provider, MD  trimethoprim-polymyxin b (POLYTRIM) ophthalmic solution Place 2 drops into the left eye every 4 (four) hours. Patient  not taking: Reported on 02/28/2015 02/09/15   Roselee Culver, MD    Past Surgical History  Procedure Laterality Date  . Tonsillectomy    . D and c    . Varicose vein surgery  2000    History   Social History  . Marital Status: Divorced    Spouse Name: N/A  . Number of Children: N/A  . Years of Education: N/A   Occupational History  . 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   Social History Main Topics  . Smoking status: Current Every Day Smoker -- 0.50 packs/day for 43 years    Types: Cigarettes  . Smokeless tobacco: Never Used  . Alcohol Use: No  . Drug Use: No  . Sexual Activity: No   Other Topics Concern  . Not on file   Social History Narrative   Lives alone.     Family History  Problem Relation Age of Onset  . Cancer Mother   . Stroke Mother   . Parkinson's disease Father   . Parkinson's disease Sister   . Diabetes Sister   . Macular degeneration Mother      Review of Systems  Constitutional: Positive for appetite change and fatigue.  HENT: Positive for facial swelling.   Eyes: Positive for itching.  Respiratory: Negative.   Cardiovascular: Positive for palpitations.  Gastrointestinal: Positive for diarrhea and constipation.  Endocrine: Positive for cold intolerance.  Genitourinary: Positive for urgency and frequency.  Musculoskeletal: Positive for myalgias and arthralgias.  Skin: Negative.   Allergic/Immunologic: Negative.   Neurological: Negative.   Hematological: Bruises/bleeds easily.  Psychiatric/Behavioral: Positive for sleep disturbance.       Objective:   Physical Exam  Constitutional: She is oriented to person, place, and time. Vital signs are normal. She appears well-developed and well-nourished. No distress.  Blood pressure 123/71, pulse 68, resp. rate 16, height 5' 6.25" (1.683 m), weight 108 lb (48.988 kg), SpO2 96 %.  Very thin body habitus.   HENT:  Head: Normocephalic and atraumatic.  Right Ear:  Hearing, tympanic membrane, external ear and ear canal normal.  Left Ear: Hearing, tympanic membrane, external ear and ear canal normal.  Nose: No nasal deformity or septal deviation.  Mouth/Throat: Uvula is midline and oropharynx is clear and moist. No oral lesions. Normal dentition.  Eyes: Conjunctivae, EOM and lids are normal. Pupils are equal, round, and reactive to light. No scleral icterus.  Wears corrective lenses.  Neck: Trachea normal, normal range of motion, full passive range of motion without pain and phonation normal. Neck supple. No JVD present. No spinous process tenderness and no muscular tenderness present. Carotid bruit is not present. No thyroid mass and no thyromegaly present.  Cardiovascular: Normal rate, regular rhythm, S1 normal, S2 normal, normal heart sounds, intact distal pulses and normal pulses.   No extrasystoles are present. PMI is not displaced.  Exam reveals no gallop and no friction rub.   No murmur heard. Pulmonary/Chest: Breath sounds normal. No respiratory distress. She has no decreased breath sounds. She has no wheezes. Right breast exhibits no inverted nipple, no mass, no nipple discharge, no skin change and no tenderness. Left breast exhibits no inverted nipple, no mass, no nipple discharge, no skin change and no tenderness. Breasts are symmetrical.  Abdominal: Soft. Normal appearance and normal aorta. She exhibits no distension, no pulsatile midline mass and no mass. Bowel sounds are increased. There is no hepatosplenomegaly. There is no tenderness. There is no rebound and no CVA tenderness.  Genitourinary:  Deferred.  Musculoskeletal:       Cervical back: Normal.       Thoracic back: She exhibits deformity. She exhibits no tenderness and no bony tenderness.       Lumbar back: She exhibits deformity. She exhibits no tenderness and no bony tenderness.  Scoliosis of T-spine w/ curvature to the L.  Lymphadenopathy:       Head (right side): No submental, no  submandibular, no tonsillar, no preauricular, no posterior auricular and no occipital adenopathy present.       Head (left side): No submental, no submandibular, no tonsillar, no preauricular, no posterior auricular and no occipital adenopathy present.    She has no cervical adenopathy.    She has no axillary adenopathy.       Right: No inguinal and no supraclavicular adenopathy present.       Left: No inguinal and no supraclavicular adenopathy present.  Neurological: She is alert and oriented to person, place, and time. She has normal strength. She displays no atrophy and no tremor. No cranial nerve deficit or sensory deficit. She exhibits normal muscle tone. She displays a negative Romberg sign. Coordination and gait normal. She displays no Babinski's sign on the right side. She displays no Babinski's sign on the left side.  Reflex  Scores:      Tricep reflexes are 2+ on the right side and 2+ on the left side.      Bicep reflexes are 3+ on the right side and 3+ on the left side.      Brachioradialis reflexes are 2+ on the right side and 2+ on the left side.      Patellar reflexes are 3+ on the right side and 3+ on the left side. GET UP and GO Test: time= 7 sec.  Skin: Skin is warm, dry and intact. No ecchymosis and no rash noted. She is not diaphoretic. No cyanosis or erythema. No pallor. Nails show no clubbing.  Psychiatric: She has a normal mood and affect. Her speech is normal and behavior is normal. Judgment and thought content normal. Cognition and memory are normal.    Results for orders placed or performed in visit on 02/28/15  POCT urinalysis dipstick  Result Value Ref Range   Color, UA yellow    Clarity, UA clear    Glucose, UA neg    Bilirubin, UA neg    Ketones, UA neg    Spec Grav, UA 1.015    Blood, UA neg    pH, UA 7.0    Protein, UA trace    Urobilinogen, UA 0.2    Nitrite, UA neg    Leukocytes, UA Negative       Assessment & Plan:  Medicare annual wellness visit,  initial - Plan: Lipid panel  Hypothyroidism, unspecified hypothyroidism type - Continue current medication pending lab results. Plan: Thyroid Panel With TSH, Lipid panel  Long-term use of high-risk medication - Psychiatric condition stable on current medications; pt is satisfied w/ this treatment regimen. 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 Her weekend pleasure involves relaxing with a good book. Plan: CBC with Differential/Platelet  Screening, lipid - Plan: Lipid panel  Screening for colorectal cancer - Plan: Ambulatory referral to Gastroenterology  Diverticulosis of large intestine without hemorrhage - Plan: Ambulatory referral to Gastroenterology  Depression with anxiety- Continue medications (paroxetine, bupropion and trazodone) as prescribed by her therapist.   Meds ordered this encounter  Medications  . valACYclovir (VALTREX) 1000 MG tablet    Sig: TAKE 1/2 TABLET DAILY OR AS DIRECTED.    Dispense:  30 tablet    Refill:  5

## 2015-03-03 ENCOUNTER — Other Ambulatory Visit: Payer: Self-pay | Admitting: Family Medicine

## 2015-03-03 DIAGNOSIS — E039 Hypothyroidism, unspecified: Secondary | ICD-10-CM

## 2015-03-03 MED ORDER — LEVOTHYROXINE SODIUM 50 MCG PO TABS: 50.0000 ug | ORAL_TABLET | Freq: Every day | ORAL | Status: DC

## 2015-03-05 DIAGNOSIS — F331 Major depressive disorder, recurrent, moderate: Secondary | ICD-10-CM | POA: Diagnosis not present

## 2015-03-05 DIAGNOSIS — G4700 Insomnia, unspecified: Secondary | ICD-10-CM | POA: Diagnosis not present

## 2015-03-05 DIAGNOSIS — F411 Generalized anxiety disorder: Secondary | ICD-10-CM | POA: Diagnosis not present

## 2015-03-20 DIAGNOSIS — Z1211 Encounter for screening for malignant neoplasm of colon: Secondary | ICD-10-CM | POA: Diagnosis not present

## 2015-03-20 DIAGNOSIS — E039 Hypothyroidism, unspecified: Secondary | ICD-10-CM | POA: Diagnosis not present

## 2015-03-20 DIAGNOSIS — D72819 Decreased white blood cell count, unspecified: Secondary | ICD-10-CM | POA: Diagnosis not present

## 2015-03-26 ENCOUNTER — Encounter: Payer: Self-pay | Admitting: Family Medicine

## 2015-03-27 ENCOUNTER — Other Ambulatory Visit: Payer: Self-pay | Admitting: Family Medicine

## 2015-03-27 DIAGNOSIS — D709 Neutropenia, unspecified: Secondary | ICD-10-CM

## 2015-03-29 ENCOUNTER — Other Ambulatory Visit (INDEPENDENT_AMBULATORY_CARE_PROVIDER_SITE_OTHER): Payer: Medicare Other | Admitting: Family Medicine

## 2015-03-29 DIAGNOSIS — D709 Neutropenia, unspecified: Secondary | ICD-10-CM | POA: Diagnosis not present

## 2015-03-29 LAB — CBC
HCT: 38 % (ref 36.0–46.0)
Hemoglobin: 13.2 g/dL (ref 12.0–15.0)
MCH: 32.8 pg (ref 26.0–34.0)
MCHC: 34.7 g/dL (ref 30.0–36.0)
MCV: 94.5 fL (ref 78.0–100.0)
MPV: 9.5 fL (ref 8.6–12.4)
Platelets: 194 10*3/uL (ref 150–400)
RBC: 4.02 MIL/uL (ref 3.87–5.11)
RDW: 13.7 % (ref 11.5–15.5)
WBC: 3.6 10*3/uL — AB (ref 4.0–10.5)

## 2015-05-04 ENCOUNTER — Ambulatory Visit (INDEPENDENT_AMBULATORY_CARE_PROVIDER_SITE_OTHER): Payer: Medicare Other | Admitting: Family Medicine

## 2015-05-04 VITALS — BP 110/80 | HR 69 | Temp 97.9°F | Ht 66.0 in | Wt 108.0 lb

## 2015-05-04 DIAGNOSIS — H6121 Impacted cerumen, right ear: Secondary | ICD-10-CM | POA: Diagnosis not present

## 2015-05-04 DIAGNOSIS — K5732 Diverticulitis of large intestine without perforation or abscess without bleeding: Secondary | ICD-10-CM

## 2015-05-04 MED ORDER — AMOXICILLIN-POT CLAVULANATE 875-125 MG PO TABS
1.0000 | ORAL_TABLET | Freq: Two times a day (BID) | ORAL | Status: DC
Start: 2015-05-04 — End: 2016-01-25

## 2015-05-04 NOTE — Progress Notes (Signed)
° °  Subjective:    Patient ID: Kristine Davidson, female    DOB: [birth year], 64 y.o.   MRN: [id number] This chart was scribed for Robyn Haber, MD by Zola Button, Medical Scribe. This patient was seen in Room 2 and the patient's care was started at 9:44 AM.   HPI HPI Comments: Kristine Davidson is a 64 y.o. female with a hx of diverticulitis of colon who presents to the [facility] complaining of gradual onset, intermittent left-sided abdominal pain that started about a month ago. She also reports having some constipation, diarrhea and abdominal fullness. She sometimes feels like her stomach may "explode," which she thinks may be due to gas. Patient denies fever, vomiting, blood in stool, and SOB. She thinks her symptoms may be related to her diet; she reports eating lots of salads, avocados, kale, and cabbage. She has had diverticulitis in the past and thinks she may be having diverticulitis again. Patient was referred to Dr. Benson Norway for colonoscopy, but she feels he was not a good match for her. She cancelled her last colonoscopy as she was dealing with diverticulitis at the time. No known allergies to antibiotics per patient.  Patient also reports having right ear fullness, feeling it is "stopped up."  Review of Systems  Constitutional: Negative for fever.  Respiratory: Negative for shortness of breath.   Gastrointestinal: Positive for abdominal pain, diarrhea and constipation. Negative for vomiting and blood in stool.       Objective:   Physical Exam CONSTITUTIONAL: Well developed/well nourished HEAD: Normocephalic/atraumatic EYES: EOM/PERRL ENMT: Mucous membranes moist.  Right cerumen impaction NECK: supple no meningeal signs SPINE: entire spine nontender CV: S1/S2 noted, no murmurs/rubs/gallops noted LUNGS: Lungs are clear to auscultation bilaterally, no apparent distress ABDOMEN: soft, without rebound or guarding. Fullness in the left lower quadrant with tenderness.    GU: no cva tenderness NEURO: Pt is awake/alert, moves all extremitiesx4 EXTREMITIES: pulses normal, full ROM SKIN: warm, color normal PSYCH: no abnormalities of mood noted  Cerumen impaction on right ear lavaged clear     Assessment & Plan:   This chart was scribed in my presence and reviewed by me personally.    ICD-9-CM ICD-10-CM   1. Cerumen impaction, right 380.4 H61.21   2. Diverticulitis of colon 562.11 K57.32 amoxicillin-clavulanate (AUGMENTIN) 875-125 MG per tablet   Signed, Robyn Haber, MD

## 2015-05-24 ENCOUNTER — Encounter: Payer: Self-pay | Admitting: *Deleted

## 2015-09-12 DIAGNOSIS — G4700 Insomnia, unspecified: Secondary | ICD-10-CM | POA: Diagnosis not present

## 2015-09-12 DIAGNOSIS — F331 Major depressive disorder, recurrent, moderate: Secondary | ICD-10-CM | POA: Diagnosis not present

## 2015-09-12 DIAGNOSIS — F411 Generalized anxiety disorder: Secondary | ICD-10-CM | POA: Diagnosis not present

## 2015-09-30 ENCOUNTER — Ambulatory Visit (INDEPENDENT_AMBULATORY_CARE_PROVIDER_SITE_OTHER): Payer: Medicare Other

## 2015-09-30 ENCOUNTER — Ambulatory Visit (INDEPENDENT_AMBULATORY_CARE_PROVIDER_SITE_OTHER): Payer: Medicare Other | Admitting: Family Medicine

## 2015-09-30 VITALS — BP 112/70 | HR 75 | Temp 97.5°F | Resp 16 | Ht 66.0 in | Wt 108.0 lb

## 2015-09-30 DIAGNOSIS — M25511 Pain in right shoulder: Secondary | ICD-10-CM

## 2015-09-30 DIAGNOSIS — E559 Vitamin D deficiency, unspecified: Secondary | ICD-10-CM

## 2015-09-30 DIAGNOSIS — Z780 Asymptomatic menopausal state: Secondary | ICD-10-CM

## 2015-09-30 DIAGNOSIS — Z23 Encounter for immunization: Secondary | ICD-10-CM

## 2015-09-30 DIAGNOSIS — E038 Other specified hypothyroidism: Secondary | ICD-10-CM

## 2015-09-30 DIAGNOSIS — R636 Underweight: Secondary | ICD-10-CM | POA: Diagnosis not present

## 2015-09-30 LAB — COMPREHENSIVE METABOLIC PANEL
ALK PHOS: 64 U/L (ref 33–130)
ALT: 13 U/L (ref 6–29)
AST: 16 U/L (ref 10–35)
Albumin: 4.6 g/dL (ref 3.6–5.1)
BILIRUBIN TOTAL: 0.6 mg/dL (ref 0.2–1.2)
BUN: 7 mg/dL (ref 7–25)
CALCIUM: 9.3 mg/dL (ref 8.6–10.4)
CO2: 30 mmol/L (ref 20–31)
Chloride: 103 mmol/L (ref 98–110)
Creat: 0.57 mg/dL (ref 0.50–0.99)
Glucose, Bld: 92 mg/dL (ref 65–99)
Potassium: 4.2 mmol/L (ref 3.5–5.3)
SODIUM: 140 mmol/L (ref 135–146)
Total Protein: 6.5 g/dL (ref 6.1–8.1)

## 2015-09-30 LAB — CBC
HCT: 38.3 % (ref 36.0–46.0)
Hemoglobin: 13.3 g/dL (ref 12.0–15.0)
MCH: 31.8 pg (ref 26.0–34.0)
MCHC: 34.7 g/dL (ref 30.0–36.0)
MCV: 91.6 fL (ref 78.0–100.0)
MPV: 9.8 fL (ref 8.6–12.4)
PLATELETS: 180 10*3/uL (ref 150–400)
RBC: 4.18 MIL/uL (ref 3.87–5.11)
RDW: 12.1 % (ref 11.5–15.5)
WBC: 3.9 10*3/uL — ABNORMAL LOW (ref 4.0–10.5)

## 2015-09-30 NOTE — Patient Instructions (Addendum)
I will refer you to physical therapy for your shoulder I think that your problem is tendonitis in the rotator cuff Also, I would like for them to work on your general conditioning and strengthening  Please try to work on increasing your calorie intake-  Increasing the fat in your diet may be the best way to do this.   Please work on eating 3 solid meals and at least 2 snacks daily- make sure there is fat and protein in every meal and snack if possible  Let me know if you do not hear about your PT appointment soon I will be in touch with your labs- let's plan to touch base for a weight check in about 8 weeks

## 2015-09-30 NOTE — Progress Notes (Signed)
[facility] [address], [place] [address] [phone]  Date:  09/30/2015   Name:  Kristine Davidson   DOB:  07-23-1951   MRN:  [id number]  PCP:  Robyn Haber, MD    Chief Complaint: Shoulder Pain   History of Present Illness:  Kristine Davidson is a 64 y.o. very pleasant female patient who presents with the following:  She has noted a pain/ soreness and weakness in both of her shoulders- right more than left She has noted this for about one week Aleve helps but she was not sure if it was ok to keep taking it  She is not aware of any change in her activity that could have triggered this Her muscles feel sore, and she has soreness with motions such as pulling a garment off over her head  No other sx of illness.  No other muscle aches  She is on wellbutrin for her mood  Wt Readings from Last 3 Encounters:  09/30/15 108 lb (48.988 kg)  05/04/15 108 lb (48.988 kg)  02/28/15 108 lb (48.988 kg)   She quit drinking alcohol in 2006 and lost weight- she is aware that she is too thin and would like to work on gaining weight Her daily diet does have some fat in it, but overall she does not eat much food  Her last bone density was about 2 years ago. She is not sure of the results - never heard them per her report She is not sure where this scan was done so we cannot request results for her Menopause in 1999  She would like a flu shot today- also pneumovax done in 2011, no prevnar yet  Patient Active Problem List   Diagnosis Date Noted  . Depression with anxiety 02/28/2015  . HSV-2 (herpes simplex virus 2) infection 03/31/2012  . B12 deficiency 03/31/2012  . Vitamin D deficiency disease 03/31/2012  . H/O diverticulitis of colon 02/17/2012  . Hypothyroid 02/17/2012    Past Medical History  Diagnosis Date  . Depression   . Mitral valve prolapse   . Anemia   . Diverticular disease   . Thyroid disease   . HSV-2 (herpes simplex virus 2) infection    . Substance abuse   . Hyperlipidemia   . Hypertension     Past Surgical History  Procedure Laterality Date  . Tonsillectomy    . D and c    . Varicose vein surgery  2000    Social History  Substance Use Topics  . Smoking status: Current Every Day Smoker -- 0.50 packs/day for 43 years    Types: Cigarettes  . Smokeless tobacco: Never Used  . Alcohol Use: No    Family History  Problem Relation Age of Onset  . Cancer Mother   . Stroke Mother   . Parkinson's disease Father   . Parkinson's disease Sister   . Diabetes Sister   . Macular degeneration Mother     Allergies  Allergen Reactions  . Codeine Itching    Medication list has been reviewed and updated.  Current Outpatient Prescriptions on File Prior to Visit  Medication Sig Dispense Refill  . buPROPion (WELLBUTRIN XL) 150 MG 24 hr tablet Take 150 mg by mouth daily.    . cholecalciferol (VITAMIN D) 1000 UNITS tablet Take 2,000 Units by mouth daily.     Marland Kitchen levothyroxine (SYNTHROID, LEVOTHROID) 50 MCG tablet Take 1 tablet (50 mcg total) by mouth daily. 90 tablet 1  .  PARoxetine (PAXIL) 20 MG tablet qhs 90 tablet 2  . traZODone (DESYREL) 50 MG tablet TAKE ONE TABLET BY MOUTH AT BEDTIME 30 tablet 3  . valACYclovir (VALTREX) 1000 MG tablet TAKE 1/2 TABLET DAILY OR AS DIRECTED. 30 tablet 5  . vitamin B-12 (CYANOCOBALAMIN) 250 MCG tablet Take 250 mcg by mouth daily.    Marland Kitchen amoxicillin-clavulanate (AUGMENTIN) 875-125 MG per tablet Take 1 tablet by mouth 2 (two) times daily. Take with food (Patient not taking: Reported on 09/30/2015) 20 tablet 0   No current facility-administered medications on file prior to visit.    Review of Systems:  As per HPI- otherwise negative.   Physical Examination: Filed Vitals:   09/30/15 1049  BP: 112/70  Pulse: 75  Temp: 97.5 F (36.4 C)  Resp: 16   Filed Vitals:   09/30/15 1049  Height: 5\' 6"  (1.676 m)  Weight: 108 lb (48.988 kg)   Body mass index is 17.44 kg/(m^2). Ideal Body  Weight: Weight in (lb) to have BMI = 25: 154.6  GEN: WDWN, NAD, Non-toxic, A & O x 3, very thin but OW looks well HEENT: Atraumatic, Normocephalic. Neck supple. No masses, No LAD. Ears and Nose: No external deformity. CV: RRR, No M/G/R. No JVD. No thrill. No extra heart sounds. PULM: CTA B, no wheezes, crackles, rhonchi. No retractions. No resp. distress. No accessory muscle use. EXTR: No c/c/e NEURO Normal gait.  PSYCH: Normally interactive. Conversant. Not depressed or anxious appearing.  Calm demeanor.  Right shoulder: she has discomfort with full flexion and with Neer's testing of the right shoulder Normal BUE strength, ROM, and negative empty can test  [facility] reading (PRIMARY) by  Dr. Lorelei Pont Right shoulder: mild degenerative change, OW normal  COMPARISON: Partial visualization of the right shoulder on a CT scan dated March 14th 2014 and chest x-ray of December 04, 2013.  FINDINGS: The bones of the shoulder are reasonably well mineralized. There is no acute fracture nor dislocation. There is no lytic or blastic lesion. The articular surfaces of the humeral head and bony glenoid are intact. The [facility] joint exhibits no significant degenerative change.  IMPRESSION: There is no acute or significant chronic bony abnormality of the right shoulder.   Lab Results  Component Value Date   TSH 2.603 02/28/2015     Assessment and Plan: Right shoulder pain - Plan: DG Shoulder Right, Ambulatory referral to Physical Therapy  Underweight - Plan: Comprehensive metabolic panel, CBC, Vitamin D, 25-hydroxy  Post-menopausal - Plan: DG Bone Density, Vitamin D, 25-hydroxy  Other specified hypothyroidism - Plan: TSH  Immunization due - Plan: Flu Vaccine QUAD 36+ mos IM, Pneumococcal conjugate vaccine 13-valent IM  Vitamin D deficiency - Plan: Vitamin D, 25-hydroxy  Await labs for her today-  Her very thin build does increase her risk of osteoporosis and fracture  She is aware of this and  will try to work on increasing her caloric intake.  Recheck in 2 months for weight check.   Updated flu and pneumonia shots today Referral to PT for likely right RCT tendonitis.  Also, would like to work on her general strength and endurance  Will plan further follow- up pending labs.    Signed Lamar Blinks, MD

## 2015-10-01 ENCOUNTER — Other Ambulatory Visit: Payer: Self-pay

## 2015-10-01 DIAGNOSIS — E2839 Other primary ovarian failure: Secondary | ICD-10-CM

## 2015-10-01 LAB — TSH: TSH: 1.368 u[IU]/mL (ref 0.350–4.500)

## 2015-10-01 LAB — VITAMIN D 25 HYDROXY (VIT D DEFICIENCY, FRACTURES): Vit D, 25-Hydroxy: 36 ng/mL (ref 30–100)

## 2015-10-18 DIAGNOSIS — M25511 Pain in right shoulder: Secondary | ICD-10-CM | POA: Diagnosis not present

## 2015-10-25 DIAGNOSIS — M25511 Pain in right shoulder: Secondary | ICD-10-CM | POA: Diagnosis not present

## 2015-10-30 DIAGNOSIS — M25511 Pain in right shoulder: Secondary | ICD-10-CM | POA: Diagnosis not present

## 2015-11-01 DIAGNOSIS — M25511 Pain in right shoulder: Secondary | ICD-10-CM | POA: Diagnosis not present

## 2015-11-06 DIAGNOSIS — D239 Other benign neoplasm of skin, unspecified: Secondary | ICD-10-CM | POA: Diagnosis not present

## 2015-11-06 DIAGNOSIS — M25511 Pain in right shoulder: Secondary | ICD-10-CM | POA: Diagnosis not present

## 2015-11-06 DIAGNOSIS — L920 Granuloma annulare: Secondary | ICD-10-CM | POA: Diagnosis not present

## 2015-11-08 DIAGNOSIS — M25511 Pain in right shoulder: Secondary | ICD-10-CM | POA: Diagnosis not present

## 2015-11-11 DIAGNOSIS — M25511 Pain in right shoulder: Secondary | ICD-10-CM | POA: Diagnosis not present

## 2015-11-20 DIAGNOSIS — M25511 Pain in right shoulder: Secondary | ICD-10-CM | POA: Diagnosis not present

## 2015-11-22 DIAGNOSIS — M25511 Pain in right shoulder: Secondary | ICD-10-CM | POA: Diagnosis not present

## 2015-11-27 DIAGNOSIS — M25511 Pain in right shoulder: Secondary | ICD-10-CM | POA: Diagnosis not present

## 2015-11-28 ENCOUNTER — Other Ambulatory Visit: Payer: Self-pay

## 2015-11-28 DIAGNOSIS — E039 Hypothyroidism, unspecified: Secondary | ICD-10-CM

## 2015-11-28 MED ORDER — LEVOTHYROXINE SODIUM 50 MCG PO TABS: 50.0000 ug | ORAL_TABLET | Freq: Every day | ORAL | Status: DC

## 2015-11-29 DIAGNOSIS — M25511 Pain in right shoulder: Secondary | ICD-10-CM | POA: Diagnosis not present

## 2015-12-04 DIAGNOSIS — M25511 Pain in right shoulder: Secondary | ICD-10-CM | POA: Diagnosis not present

## 2015-12-11 DIAGNOSIS — M25511 Pain in right shoulder: Secondary | ICD-10-CM | POA: Diagnosis not present

## 2015-12-18 DIAGNOSIS — M25511 Pain in right shoulder: Secondary | ICD-10-CM | POA: Diagnosis not present

## 2015-12-20 DIAGNOSIS — M25511 Pain in right shoulder: Secondary | ICD-10-CM | POA: Diagnosis not present

## 2016-01-25 ENCOUNTER — Ambulatory Visit (INDEPENDENT_AMBULATORY_CARE_PROVIDER_SITE_OTHER): Payer: Medicare Other | Admitting: Family Medicine

## 2016-01-25 VITALS — BP 112/60 | HR 52 | Temp 98.0°F | Resp 16 | Ht 65.5 in | Wt 108.0 lb

## 2016-01-25 DIAGNOSIS — E507 Other ocular manifestations of vitamin A deficiency: Secondary | ICD-10-CM | POA: Diagnosis not present

## 2016-01-25 DIAGNOSIS — H65191 Other acute nonsuppurative otitis media, right ear: Secondary | ICD-10-CM

## 2016-01-25 MED ORDER — AMOXICILLIN 500 MG PO CAPS: 500.0000 mg | ORAL_CAPSULE | Freq: Three times a day (TID) | ORAL | Status: DC

## 2016-01-25 NOTE — Patient Instructions (Signed)
Try Lacri-Lube for the dry eyes (twice a day) if this doesn't work we can use an anti-inflammatory. Just let me know.  [email or web address]

## 2016-01-25 NOTE — Progress Notes (Signed)
By signing my name below, I, Rawaa Al Rifaie, attest that this documentation has been prepared under the direction and in the presence of Robyn Haber, [place], Medical Scribe. 01/25/2016.  9:56 AM.   Patient ID: Kristine Davidson MRN: [id number], DOB: 11-Apr-1951, 65 y.o. Date of Encounter: 01/25/2016  Primary Physician: Robyn Haber, MD  Chief Complaint:  Chief Complaint  Patient presents with  . Ear Pain    Right ear  . Puffy eyes    No visual changes, on and off    HPI:  Kristine Davidson is a 65 y.o. female who presents to [facility] complaining of right ear ache, gradual onset 1 week ago.  She states that her right ear is usually the side where she experiences more impaction versus the left. She has not applied any medications on the area.  Pt also reports having symptoms of intermittent eye puffiness. She denies associated visual changes.     Past Medical History  Diagnosis Date  . Depression   . Mitral valve prolapse   . Anemia   . Diverticular disease   . Thyroid disease   . HSV-2 (herpes simplex virus 2) infection   . Substance abuse   . Hyperlipidemia   . Hypertension      Home Meds: Prior to Admission medications   Medication Sig Start Date End Date Taking? Authorizing Provider  buPROPion (WELLBUTRIN XL) 150 MG 24 hr tablet Take 150 mg by mouth daily.   Yes Historical Provider, MD  cholecalciferol (VITAMIN D) 1000 UNITS tablet Take 2,000 Units by mouth daily.    Yes Historical Provider, MD  levothyroxine (SYNTHROID, LEVOTHROID) 50 MCG tablet Take 1 tablet (50 mcg total) by mouth daily. 11/28/15  Yes Gay Filler Copland, MD  PARoxetine (PAXIL) 20 MG tablet qhs 01/10/14  Yes Robyn Haber, MD  traZODone (DESYREL) 50 MG tablet TAKE ONE TABLET BY MOUTH AT BEDTIME 08/22/13  Yes Chelle Jeffery, PA-C  valACYclovir (VALTREX) 1000 MG tablet TAKE 1/2 TABLET DAILY OR AS DIRECTED. 02/28/15  Yes Barton Fanny, MD  vitamin B-12  (CYANOCOBALAMIN) 250 MCG tablet Take 250 mcg by mouth daily.   Yes Historical Provider, MD  amoxicillin-clavulanate (AUGMENTIN) 875-125 MG per tablet Take 1 tablet by mouth 2 (two) times daily. Take with food Patient not taking: Reported on 09/30/2015 05/04/15   Robyn Haber, MD    Allergies:  Allergies  Allergen Reactions  . Codeine Itching    Social History   Social History  . Marital Status: Divorced    Spouse Name: N/A  . Number of Children: N/A  . Years of Education: N/A   Occupational History  . Teacher     Retired   Social History Main Topics  . Smoking status: Current Every Day Smoker -- 0.50 packs/day for 43 years    Types: Cigarettes  . Smokeless tobacco: Never Used  . Alcohol Use: No  . Drug Use: No  . Sexual Activity: No   Other Topics Concern  . Not on file   Social History Narrative   Lives alone.      Review of Systems: Constitutional: negative for chills, fever, night sweats, weight changes, or fatigue  HEENT: negative for vision changes, hearing loss, congestion, rhinorrhea, ST, epistaxis, or sinus pressure. Positive for ear pain.  Cardiovascular: negative for chest pain or palpitations Respiratory: negative for hemoptysis, wheezing, shortness of breath, or cough Abdominal: negative for abdominal pain, nausea, vomiting, diarrhea, or constipation Dermatological: negative  for rash Neurologic: negative for headache, dizziness, or syncope All other systems reviewed and are otherwise negative with the exception to those above and in the HPI.  Physical Exam: Blood pressure 112/60, pulse 52, temperature 98 F (36.7 C), temperature source Oral, resp. rate 16, height 5' 5.5" (1.664 m), weight 108 lb (48.988 kg), SpO2 95 %., Body mass index is 17.69 kg/(m^2). General: Well developed, well nourished, in no acute distress. Head: Normocephalic, atraumatic, eyes without discharge, sclera non-icteric, nares are without discharge. Oral cavity moist, posterior  pharynx without exudate, erythema, peritonsillar abscess, or post nasal drip. Right ear is mildly erythematous. No light reflux.  Mild erythema both eyes without papillary changes or exudates Neck: Supple. No thyromegaly. Full ROM. No lymphadenopathy. Lungs: Clear bilaterally to auscultation without wheezes, rales, or rhonchi. Breathing is unlabored. Heart: RRR with S1 S2. No murmurs, rubs, or gallops appreciated. Msk:  Strength and tone normal for age. Extremities/Skin: Warm and dry. No clubbing or cyanosis. No edema. No rashes or suspicious lesions. Neuro: Alert and oriented X 3. Moves all extremities spontaneously. Gait is normal. CNII-XII grossly in tact. Psych:  Responds to questions appropriately with a normal affect.     ASSESSMENT AND PLAN:  65 y.o. year old female with   This chart was scribed in my presence and reviewed by me personally.    ICD-9-CM ICD-10-CM   1. Acute nonsuppurative otitis media of right ear 381.00 H65.191 amoxicillin (AMOXIL) 500 MG capsule     Care order/instruction     Care order/instruction  2. Xerophthalmia 372.53 E50.7 Care order/instruction     Care order/instruction   Try Lacri-Lube for the dry irritated eyes  Signed, Robyn Haber, MD  Signed, Robyn Haber, MD 01/25/2016 9:50 AM

## 2016-02-10 ENCOUNTER — Telehealth: Payer: Self-pay

## 2016-02-10 DIAGNOSIS — J0100 Acute maxillary sinusitis, unspecified: Secondary | ICD-10-CM

## 2016-02-10 NOTE — Telephone Encounter (Signed)
Pt states she was seen for fluid in her ears and given Amox, the pain has come back and didn't know what to do. Please call [facility]

## 2016-02-10 NOTE — Telephone Encounter (Signed)
Pt reports that she took the amox as Rxd (and did not have any SEs from it) and Sxs much improved but did not completely resolve. Now the pressure is getting worse again since she has been finished w/Abx for several days. It is not to the point that it was when she came in, but it feels stopped up, and when she lies down it aches. Does pt need a little longer on Abx or do you need to see her back first?

## 2016-02-11 ENCOUNTER — Telehealth: Payer: Self-pay

## 2016-02-11 DIAGNOSIS — J0100 Acute maxillary sinusitis, unspecified: Secondary | ICD-10-CM

## 2016-02-11 MED ORDER — AMOXICILLIN-POT CLAVULANATE 875-125 MG PO TABS: 1.0000 | ORAL_TABLET | Freq: Two times a day (BID) | ORAL | Status: DC

## 2016-02-11 MED ORDER — LEVOFLOXACIN 500 MG PO TABS: 500.0000 mg | ORAL_TABLET | Freq: Every day | ORAL | Status: DC

## 2016-02-11 NOTE — Telephone Encounter (Signed)
It's probably okay, but I will change the prescription to something else to be on the safe side.

## 2016-02-11 NOTE — Telephone Encounter (Signed)
Dr Carlean Jews, pharm called w/message about combination of Levaquin, trazodone and paroxetine together can increase chances of QT prolongation and arrhythmias. They want to know if it is OK to dispense Levaquin?

## 2016-02-13 NOTE — Telephone Encounter (Signed)
Dr. Carlean Jews  Please see previous message

## 2016-02-14 ENCOUNTER — Other Ambulatory Visit: Payer: Self-pay | Admitting: Family Medicine

## 2016-02-14 ENCOUNTER — Telehealth: Payer: Self-pay

## 2016-02-14 DIAGNOSIS — J0101 Acute recurrent maxillary sinusitis: Secondary | ICD-10-CM

## 2016-02-14 MED ORDER — LEVOFLOXACIN 500 MG PO TABS: 500.0000 mg | ORAL_TABLET | Freq: Every day | ORAL | Status: DC

## 2016-02-14 NOTE — Telephone Encounter (Signed)
Pharm called to check to see if the Levaquin Rx sent in today was meant to be filled or sent in error. See prev message about change to Augmentin. I checked w/Dr L, and he asked me to cancel the Levaquin Rx sent today. Done.

## 2016-02-14 NOTE — Telephone Encounter (Signed)
Augmentin was sent in.

## 2016-02-18 ENCOUNTER — Ambulatory Visit (INDEPENDENT_AMBULATORY_CARE_PROVIDER_SITE_OTHER): Payer: Medicare Other | Admitting: Physician Assistant

## 2016-02-18 VITALS — BP 108/70 | HR 65 | Temp 98.3°F | Resp 20 | Ht 65.75 in | Wt 106.8 lb

## 2016-02-18 DIAGNOSIS — H9201 Otalgia, right ear: Secondary | ICD-10-CM

## 2016-02-18 MED ORDER — NEOMYCIN-POLYMYXIN-HC 3.5-10000-1 OT SOLN: 3.0000 [drp] | Freq: Three times a day (TID) | OTIC | Status: DC

## 2016-02-18 MED ORDER — FLUTICASONE PROPIONATE 50 MCG/ACT NA SUSP: 2.0000 | Freq: Every day | NASAL | Status: DC

## 2016-02-18 NOTE — Progress Notes (Signed)
02/18/2016 1:24 PM   DOB: 1951-04-28 / MRN: [id number]  SUBJECTIVE:  Kristine Davidson is a 65 y.o. female presenting for a follow up of right sided ear pain.  Reports that she was seen by Dr. Joseph Art and was advised she had otitis media and was placed on ten days of Amoxicillin which she took and there were no changes in her symptoms.  She called the clinic and advised that her symptoms did not improve and was placed on Levaquin for acute maxillary sinusitis. The pharmacy called to advise this was may cause side effects and the Levaquin was changed to Amox-clav which she has taken for 6 days and continues to see no improvement in her symptoms may be worse.  She associates right ear tenderness.  Also complains of inner ear pressure and feels that the ear "needs to pop."  No fever, chills, nausea.   She is allergic to codeine.   She  has a past medical history of Depression; Mitral valve prolapse; Anemia; Diverticular disease; Thyroid disease; HSV-2 (herpes simplex virus 2) infection; Substance abuse; Hyperlipidemia; and Hypertension.    She  reports that she has been smoking Cigarettes.  She has a 21.5 pack-year smoking history. She has never used smokeless tobacco. She reports that she does not drink alcohol or use illicit drugs. She  reports that she does not engage in sexual activity. The patient  has past surgical history that includes Tonsillectomy; D and C; and Varicose vein surgery (2000).  Her family history includes Cancer in her mother; Diabetes in her sister; Macular degeneration in her mother; Parkinson's disease in her father and sister; Stroke in her mother.  Review of Systems  Eyes: Negative for pain.  Skin: Negative for rash.  Neurological: Negative for dizziness and headaches.    Problem list and medications reviewed and updated by myself where necessary, and exist elsewhere in the encounter.   OBJECTIVE:  BP 108/70 mmHg  Pulse 65  Temp(Src) 98.3 F (36.8 C) (Oral)   Resp 20  Ht 5' 5.75" (1.67 m)  Wt 106 lb 12.8 oz (48.444 kg)  BMI 17.37 kg/m2  SpO2 98%  Physical Exam  Constitutional: She is oriented to person, place, and time. She appears well-nourished. No distress.  HENT:  Right Ear: Hearing and tympanic membrane normal.  Left Ear: Hearing and tympanic membrane normal.  Ears:  Eyes: EOM are normal. Pupils are equal, round, and reactive to light.  Cardiovascular: Normal rate.   Pulmonary/Chest: Effort normal.  Abdominal: She exhibits no distension.  Neurological: She is alert and oriented to person, place, and time. No cranial nerve deficit. Gait normal.  Skin: Skin is dry. She is not diaphoretic.  Psychiatric: She has a normal mood and affect.  Vitals reviewed.   No results found for this or any previous visit (from the past 72 hour(s)).  No results found.  ASSESSMENT AND PLAN  Kristine Davidson was seen today for follow-up.  Diagnoses and all orders for this visit:  Ear pain, right: I am stopping oral antibiotics.  Her symptoms are most consistent with ETD, however she does have some ear tenderness. Will start her on flonase and Cortisporin, and will stop the latter if she does not see an improvement in symptoms in five days.  Will send to ENT if the plan fails.  I would like to try her on pseudoephedrine however she has a history of Mitral Valve prolapse and I am not particularly comfortable with the vasogenic effects of pseudoephedrine.  -  fluticasone (FLONASE) 50 MCG/ACT nasal spray; Place 2 sprays into both nostrils daily. -     neomycin-polymyxin-hydrocortisone (CORTISPORIN) otic solution; Place 3 drops into the right ear 3 (three) times daily. Use five days.If no improvement call and I will refer you to an ENT.Continue Flonase.    The patient was advised to call or return to clinic if she does not see an improvement in symptoms or to seek the care of the closest emergency department if she worsens with the above plan.   Philis Fendt,  MHS, PA-C [facility] 02/18/2016 1:24 PM

## 2016-04-24 ENCOUNTER — Encounter: Payer: Self-pay | Admitting: Family Medicine

## 2016-04-24 ENCOUNTER — Other Ambulatory Visit: Payer: Self-pay

## 2016-04-24 ENCOUNTER — Telehealth: Payer: Self-pay

## 2016-04-24 MED ORDER — VALACYCLOVIR HCL 1 G PO TABS: ORAL_TABLET | ORAL | Status: DC

## 2016-04-24 NOTE — Telephone Encounter (Signed)
Kristine Davidson is asking for a refill on valacyclovir [facility] number [phone]

## 2016-05-13 DIAGNOSIS — F331 Major depressive disorder, recurrent, moderate: Secondary | ICD-10-CM | POA: Diagnosis not present

## 2016-05-13 DIAGNOSIS — F411 Generalized anxiety disorder: Secondary | ICD-10-CM | POA: Diagnosis not present

## 2016-05-13 DIAGNOSIS — G4700 Insomnia, unspecified: Secondary | ICD-10-CM | POA: Diagnosis not present

## 2016-06-05 ENCOUNTER — Other Ambulatory Visit: Payer: Self-pay | Admitting: Family Medicine

## 2016-08-11 DIAGNOSIS — F411 Generalized anxiety disorder: Secondary | ICD-10-CM | POA: Diagnosis not present

## 2016-08-11 DIAGNOSIS — G4700 Insomnia, unspecified: Secondary | ICD-10-CM | POA: Diagnosis not present

## 2016-08-11 DIAGNOSIS — F331 Major depressive disorder, recurrent, moderate: Secondary | ICD-10-CM | POA: Diagnosis not present

## 2016-08-26 ENCOUNTER — Other Ambulatory Visit: Payer: Self-pay | Admitting: Family Medicine

## 2016-08-26 ENCOUNTER — Other Ambulatory Visit: Payer: Self-pay | Admitting: Emergency Medicine

## 2016-08-26 MED ORDER — LEVOTHYROXINE SODIUM 50 MCG PO TABS: ORAL_TABLET | ORAL | 0 refills | Status: DC

## 2016-09-22 ENCOUNTER — Telehealth: Payer: Self-pay

## 2016-09-22 MED ORDER — LEVOTHYROXINE SODIUM 50 MCG PO TABS: ORAL_TABLET | ORAL | 1 refills | Status: DC

## 2016-09-22 NOTE — Telephone Encounter (Signed)
Patient needs her levothyroxine (SYNTHROID, LEVOTHROID) 50 MCG tablet refilled she does have a CPE scheduled in Nov with Dr Brigitte Pulse, so if we could give her enough to last until her appointment.  She uses the [facility] at [facility].

## 2016-09-22 NOTE — Telephone Encounter (Signed)
Sent in RFs and notified pt. 

## 2016-10-29 ENCOUNTER — Other Ambulatory Visit: Payer: Self-pay | Admitting: Physician Assistant

## 2016-10-29 ENCOUNTER — Ambulatory Visit (INDEPENDENT_AMBULATORY_CARE_PROVIDER_SITE_OTHER): Payer: Medicare Other | Admitting: Family Medicine

## 2016-10-29 ENCOUNTER — Encounter: Payer: Self-pay | Admitting: Family Medicine

## 2016-10-29 VITALS — BP 108/58 | HR 74 | Temp 98.2°F | Resp 16 | Ht 65.75 in | Wt 112.4 lb

## 2016-10-29 DIAGNOSIS — Z0000 Encounter for general adult medical examination without abnormal findings: Secondary | ICD-10-CM

## 2016-10-29 DIAGNOSIS — Z1383 Encounter for screening for respiratory disorder NEC: Secondary | ICD-10-CM | POA: Diagnosis not present

## 2016-10-29 DIAGNOSIS — F5101 Primary insomnia: Secondary | ICD-10-CM

## 2016-10-29 DIAGNOSIS — E039 Hypothyroidism, unspecified: Secondary | ICD-10-CM

## 2016-10-29 DIAGNOSIS — Z1231 Encounter for screening mammogram for malignant neoplasm of breast: Secondary | ICD-10-CM | POA: Diagnosis not present

## 2016-10-29 DIAGNOSIS — Z1329 Encounter for screening for other suspected endocrine disorder: Secondary | ICD-10-CM | POA: Diagnosis not present

## 2016-10-29 DIAGNOSIS — Z23 Encounter for immunization: Secondary | ICD-10-CM

## 2016-10-29 DIAGNOSIS — Z136 Encounter for screening for cardiovascular disorders: Secondary | ICD-10-CM | POA: Diagnosis not present

## 2016-10-29 DIAGNOSIS — E785 Hyperlipidemia, unspecified: Secondary | ICD-10-CM

## 2016-10-29 DIAGNOSIS — I10 Essential (primary) hypertension: Secondary | ICD-10-CM | POA: Diagnosis not present

## 2016-10-29 DIAGNOSIS — Z130 Encounter for screening for diseases of the blood and blood-forming organs and certain disorders involving the immune mechanism: Secondary | ICD-10-CM | POA: Diagnosis not present

## 2016-10-29 DIAGNOSIS — D649 Anemia, unspecified: Secondary | ICD-10-CM | POA: Diagnosis not present

## 2016-10-29 DIAGNOSIS — F17200 Nicotine dependence, unspecified, uncomplicated: Secondary | ICD-10-CM

## 2016-10-29 DIAGNOSIS — E538 Deficiency of other specified B group vitamins: Secondary | ICD-10-CM

## 2016-10-29 DIAGNOSIS — Z1389 Encounter for screening for other disorder: Secondary | ICD-10-CM | POA: Diagnosis not present

## 2016-10-29 DIAGNOSIS — Z1211 Encounter for screening for malignant neoplasm of colon: Secondary | ICD-10-CM | POA: Diagnosis not present

## 2016-10-29 DIAGNOSIS — Z1212 Encounter for screening for malignant neoplasm of rectum: Secondary | ICD-10-CM

## 2016-10-29 DIAGNOSIS — E559 Vitamin D deficiency, unspecified: Secondary | ICD-10-CM

## 2016-10-29 DIAGNOSIS — E2839 Other primary ovarian failure: Secondary | ICD-10-CM

## 2016-10-29 LAB — POCT URINALYSIS DIP (MANUAL ENTRY)
Bilirubin, UA: NEGATIVE
Blood, UA: NEGATIVE
GLUCOSE UA: NEGATIVE
Ketones, POC UA: NEGATIVE
LEUKOCYTES UA: NEGATIVE
NITRITE UA: NEGATIVE
Protein Ur, POC: NEGATIVE
Spec Grav, UA: 1.01
UROBILINOGEN UA: 0.2
pH, UA: 8.5

## 2016-10-29 LAB — COMPREHENSIVE METABOLIC PANEL
ALBUMIN: 4.7 g/dL (ref 3.6–5.1)
ALT: 12 U/L (ref 6–29)
AST: 16 U/L (ref 10–35)
Alkaline Phosphatase: 65 U/L (ref 33–130)
BUN: 9 mg/dL (ref 7–25)
CALCIUM: 9.1 mg/dL (ref 8.6–10.4)
CHLORIDE: 101 mmol/L (ref 98–110)
CO2: 31 mmol/L (ref 20–31)
CREATININE: 0.66 mg/dL (ref 0.50–0.99)
Glucose, Bld: 83 mg/dL (ref 65–99)
Potassium: 3.8 mmol/L (ref 3.5–5.3)
SODIUM: 139 mmol/L (ref 135–146)
TOTAL PROTEIN: 7 g/dL (ref 6.1–8.1)
Total Bilirubin: 0.7 mg/dL (ref 0.2–1.2)

## 2016-10-29 LAB — CBC
HEMATOCRIT: 38.9 % (ref 35.0–45.0)
HEMOGLOBIN: 13.5 g/dL (ref 11.7–15.5)
MCH: 33.5 pg — ABNORMAL HIGH (ref 27.0–33.0)
MCHC: 34.7 g/dL (ref 32.0–36.0)
MCV: 96.5 fL (ref 80.0–100.0)
MPV: 9.7 fL (ref 7.5–12.5)
Platelets: 203 10*3/uL (ref 140–400)
RBC: 4.03 MIL/uL (ref 3.80–5.10)
RDW: 13.3 % (ref 11.0–15.0)
WBC: 3.6 10*3/uL — AB (ref 3.8–10.8)

## 2016-10-29 LAB — LIPID PANEL
CHOL/HDL RATIO: 2.2 ratio (ref <5.0)
CHOLESTEROL: 195 mg/dL (ref <200)
HDL: 88 mg/dL (ref >50)
LDL Cholesterol: 95 mg/dL
TRIGLYCERIDES: 59 mg/dL (ref <150)
VLDL: 12 mg/dL (ref <30)

## 2016-10-29 LAB — VITAMIN D 25 HYDROXY (VIT D DEFICIENCY, FRACTURES): Vit D, 25-Hydroxy: 43 ng/mL (ref 30–100)

## 2016-10-29 LAB — TSH: TSH: 3.88 mIU/L

## 2016-10-29 LAB — VITAMIN B12: Vitamin B-12: 1026 pg/mL (ref 200–1100)

## 2016-10-29 MED ORDER — VALACYCLOVIR HCL 1 G PO TABS: ORAL_TABLET | ORAL | 3 refills | Status: DC

## 2016-10-29 MED ORDER — LEVOTHYROXINE SODIUM 50 MCG PO TABS: ORAL_TABLET | ORAL | 3 refills | Status: DC

## 2016-10-29 MED ORDER — BUPROPION HCL ER (XL) 150 MG PO TB24: 150.0000 mg | ORAL_TABLET | Freq: Every day | ORAL | 3 refills | Status: DC

## 2016-10-29 MED ORDER — PAROXETINE HCL 20 MG PO TABS: 20.0000 mg | ORAL_TABLET | Freq: Every day | ORAL | 3 refills | Status: DC

## 2016-10-29 MED ORDER — ZOSTER VACCINE LIVE 19400 UNT/0.65ML ~~LOC~~ SUSR: 0.6500 mL | Freq: Once | SUBCUTANEOUS | 0 refills | Status: AC

## 2016-10-29 MED ORDER — TRAZODONE HCL 50 MG PO TABS: 50.0000 mg | ORAL_TABLET | Freq: Every day | ORAL | 2 refills | Status: DC

## 2016-10-29 NOTE — Patient Instructions (Addendum)
   Stop your valtrex several days prior and then a week after.  IF you received an x-ray today, you will receive an invoice from [facility]. Please contact [facility] at [phone] with questions or concerns regarding your invoice.   IF you received labwork today, you will receive an invoice from [employer]. Please contact [facility] at [phone] with questions or concerns regarding your invoice.   Our billing staff will not be able to assist you with questions regarding bills from these companies.  You will be contacted with the lab results as soon as they are available. The fastest way to get your results is to activate your My Chart account. Instructions are located on the last page of this paperwork. If you have not heard from [place] regarding the results in 2 weeks, please contact this office.

## 2016-10-29 NOTE — Progress Notes (Signed)
Subjective:    Kristine Davidson is a 65 y.o. female who presents for Medicare Annual/Subsequent preventive examination.  Preventive Screening-Counseling & Management  Tobacco History  Smoking Status  . Current Every Day Smoker  . Packs/day: 0.50  . Years: 43.00  . Types: Cigarettes  Smokeless Tobacco  . Never Used     Problems Prior to Visit 1. Hypothyroid: on levothyroxine 50 mcg 2. HTN 3. Vit D def: taking qd vit D supp 4000u 4.  B12 def: on oral b12 supp 5.  H/o anemia: unsure of why or when. 6. HLD: 7. Tobacco use: smoking 1/2 ppd on wellbutrin  8.  Mood d/o of chronic anxiety and depression: On wellbutrin 150 qd - 10 years-ishg, paxil 20 qd, and trazodone 50 qhs (not taking trazodone?). Sees a therapist and gets med from psych.  [facility] where pt went after her sister died.  Dr. Joseph Art had started pt on it but she isn't getting counseling anymore. 9. Recurrent HSV-2: prn valacyclovir but on suppressive as was having monthly outbreaks.  Current Problems (verified) Patient Active Problem List   Diagnosis Date Noted  . Depression with anxiety 02/28/2015  . HSV-2 (herpes simplex virus 2) infection 03/31/2012  . B12 deficiency 03/31/2012  . Vitamin D deficiency disease 03/31/2012  . H/O diverticulitis of colon 02/17/2012  . Hypothyroid 02/17/2012    Medications Prior to Visit Current Outpatient Prescriptions on File Prior to Visit  Medication Sig Dispense Refill  . buPROPion (WELLBUTRIN XL) 150 MG 24 hr tablet Take 150 mg by mouth daily.    . cholecalciferol (VITAMIN D) 1000 UNITS tablet Take 2,000 Units by mouth daily.     . fluticasone (FLONASE) 50 MCG/ACT nasal spray Place 2 sprays into both nostrils daily. 16 g 6  . levofloxacin (LEVAQUIN) 500 MG tablet Take 1 tablet (500 mg total) by mouth daily. (Patient not taking: Reported on 02/18/2016) 7 tablet 0  . levothyroxine (SYNTHROID, LEVOTHROID) 50 MCG tablet TAKE 1 TABLET (50  MCG TOTAL) BY MOUTH DAILY. 30 tablet 1  . neomycin-polymyxin-hydrocortisone (CORTISPORIN) otic solution Place 3 drops into the right ear 3 (three) times daily. Use five days.If no improvement call and I will refer you to an ENT.Continue Flonase. 10 mL 0  . PARoxetine (PAXIL) 20 MG tablet qhs 90 tablet 2  . traZODone (DESYREL) 50 MG tablet TAKE ONE TABLET BY MOUTH AT BEDTIME 30 tablet 3  . valACYclovir (VALTREX) 1000 MG tablet TAKE 1/2 TABLET DAILY OR AS DIRECTED. 45 tablet 1  . vitamin B-12 (CYANOCOBALAMIN) 250 MCG tablet Take 250 mcg by mouth daily.     No current facility-administered medications on file prior to visit.     Current Medications (verified) Current Outpatient Prescriptions  Medication Sig Dispense Refill  . buPROPion (WELLBUTRIN XL) 150 MG 24 hr tablet Take 150 mg by mouth daily.    . cholecalciferol (VITAMIN D) 1000 UNITS tablet Take 2,000 Units by mouth daily.     . fluticasone (FLONASE) 50 MCG/ACT nasal spray Place 2 sprays into both nostrils daily. 16 g 6  . levofloxacin (LEVAQUIN) 500 MG tablet Take 1 tablet (500 mg total) by mouth daily. (Patient not taking: Reported on 02/18/2016) 7 tablet 0  . levothyroxine (SYNTHROID, LEVOTHROID) 50 MCG tablet TAKE 1 TABLET (50 MCG TOTAL) BY MOUTH DAILY. 30 tablet 1  . neomycin-polymyxin-hydrocortisone (CORTISPORIN) otic solution Place 3 drops into the right ear 3 (three) times daily. Use five days.If no improvement call and I will  refer you to an ENT.Continue Flonase. 10 mL 0  . PARoxetine (PAXIL) 20 MG tablet qhs 90 tablet 2  . traZODone (DESYREL) 50 MG tablet TAKE ONE TABLET BY MOUTH AT BEDTIME 30 tablet 3  . valACYclovir (VALTREX) 1000 MG tablet TAKE 1/2 TABLET DAILY OR AS DIRECTED. 45 tablet 1  . vitamin B-12 (CYANOCOBALAMIN) 250 MCG tablet Take 250 mcg by mouth daily.     No current facility-administered medications for this visit.      Allergies (verified) Codeine   PAST HISTORY   Family History Family History  Problem  Relation Age of Onset  . Cancer Mother   . Stroke Mother   . Parkinson's disease Father   . Parkinson's disease Sister   . Diabetes Sister   . Macular degeneration Mother     Social History Social History  Substance Use Topics  . Smoking status: Current Every Day Smoker    Packs/day: 0.50    Years: 43.00    Types: Cigarettes  . Smokeless tobacco: Never Used  . Alcohol use No     Are there smokers in your home (other than you)? No  With 2 rescue dogs  Risk Factors Current exercise habits: none now but wants to start walking.   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   Cardiac risk factors: advanced age (older than 50 for men, 43 for women), dyslipidemia and hypertension.  Depression Screen Depression screen [facility] 2/9 10/29/2016 02/18/2016 01/25/2016 09/30/2015 05/04/2015  Decreased Interest 0 0 0 0 0  Down, Depressed, Hopeless 0 0 0 0 0  PHQ - 2 Score 0 0 0 0 0   Activities of Daily Living In your present state of health, do you have any difficulty performing the following activities?:  Driving? No Managing money?  No Feeding yourself? No Getting from bed to chair? No Climbing a flight of stairs? No Preparing food and eating?: No Bathing or showering? No Getting dressed: No Getting to the toilet? No Using the toilet:No Moving around from place to place: No In the past year have you fallen or had a near fall?:No - has some aches and pains and doesn't feel strong.    Are you sexually active?  No  Do you have more than one partner?  No  Hearing Difficulties: No no-concerns Do you often ask people to speak up or repeat themselves? No Do you experience ringing or noises in your ears? No Do you have difficulty understanding soft or whispered voices? No   Do you feel that you have a problem with memory? No her  sister Al Corpus was 60 and she developed Parkinson's with Lewy body and her father had this as well.  She has some anxiety about this so she pays a lot of trouble to her memory -   Do you often misplace items? Yes  Do you feel safe at home?  Yes  Cognitive Testing  Alert? Yes  Normal Appearance?Yes  Oriented to person? Yes  Place? Yes   Time? Yes  Recall of three objects?  Yes  Can perform simple calculations? Yes  Displays appropriate judgment?Yes  Can read the correct time from a watch face?Yes   Advanced Directives have been discussed with the patient? Yes  HCPOA is HC POA is brother Jaemarie Kovatch and sister Hennessy [place]. Really wants to preserve  quality of life.  List the Names of Other Physician/Practitioners you currently use: 1.  Optometrist:  [facility] 2.  Dentrist: [place] 3.  Derm - [facility] [place] annually we referred her  Indicate any recent Medical Services you may have received from other than [facility] providers in the past year (date may be approximate).  Immunization History  Administered Date(s) Administered  . Influenza, Seasonal, Injecte, Preservative Fre 11/24/2012  . Influenza,inj,Quad PF,36+ Mos 12/04/2013, 11/01/2014, 09/30/2015  . PPD Test 04/06/2012  . Pneumococcal Conjugate-13 09/30/2015  . Pneumococcal Polysaccharide-23 10/23/2010  . Tdap 08/25/2013    Screening Tests Health Maintenance  Topic Date Due  . ZOSTAVAX  12/28/2008  . DEXA SCAN  12/28/2013  . MAMMOGRAM  03/17/2014  . INFLUENZA VACCINE  07/21/2016  . PNA vac Low Risk Adult (2 of 2 - PPSV23) 09/29/2016  . COLONOSCOPY  05/11/2019  . TETANUS/TDAP  08/26/2023  . Hepatitis C Screening  Completed    All answers were reviewed with the patient and necessary referrals were made:  SHAW,EVA, MD   10/29/2016   History reviewed: allergies, current medications, past family history, past medical history, past social history, past surgical history and problem list  Review of Systems Pertinent  items noted in HPI and remainder of comprehensive ROS otherwise negative.    Objective:  BP (!) 108/58 (BP Location: Left Arm, Patient Position: Sitting, Cuff Size: Small)   Pulse 74   Temp 98.2 F (36.8 C) (Oral)   Resp 16   Ht 5' 5.75" (1.67 m)   Wt 112 lb 6.4 oz (51 kg)   SpO2 98%   BMI 18.28 kg/m  No exam data present  BP (!) 108/58 (BP Location: Left Arm, Patient Position: Sitting, Cuff Size: Small)   Pulse 74   Temp 98.2 F (36.8 C) (Oral)   Resp 16   Ht 5' 5.75" (1.67 m)   Wt 112 lb 6.4 oz (51 kg)   SpO2 98%   BMI 18.28 kg/m   General Appearance:    Alert, cooperative, no distress, appears stated age  Head:    Normocephalic, without obvious abnormality, atraumatic  Eyes:    PERRL, conjunctiva/corneas clear, EOM's intact, fundi    benign, both eyes  Ears:    Normal TM's and external ear canals, both ears  Nose:   Nares normal, septum midline, mucosa normal, no drainage    or sinus tenderness  Throat:   Lips, mucosa, and tongue normal; teeth and gums normal  Neck:   Supple, symmetrical, trachea midline, no adenopathy;    thyroid:  no enlargement/tenderness/nodules; no carotid   bruit or JVD  Back:     Symmetric, no curvature, ROM normal, no CVA tenderness  Lungs:     Clear to auscultation bilaterally, respirations unlabored  Chest Wall:    No tenderness or deformity   Heart:    Regular rate and rhythm, S1 and S2 normal, no murmur, rub   or gallop  Breast Exam:    No tenderness, masses, or nipple abnormality  Abdomen:     Soft, non-tender, bowel sounds active all four quadrants,    no masses, no organomegaly        Extremities:   Extremities normal, atraumatic, no cyanosis or edema  Pulses:   2+ and symmetric all extremities  Skin:   Skin color, texture, turgor normal, no rashes or lesions  Lymph nodes:   Cervical, supraclavicular, and axillary nodes normal  Neurologic:   CNII-XII intact, normal strength, sensation and  reflexes    throughout         Assessment:     1. Medicare annual wellness visit, subsequent   2. Encounter for screening mammogram for breast cancer   3. Screening for cardiovascular, respiratory, and genitourinary diseases   4. Screening for colorectal cancer   5. Screening for deficiency anemia   6. Screening for thyroid disorder   7. Hypothyroidism, unspecified type   8. Essential hypertension   9. Vitamin D deficiency disease   10. B12 deficiency   11. Hyperlipidemia, unspecified hyperlipidemia type   12. Anemia, unspecified type   13. Tobacco use disorder   14. Need for prophylactic vaccination and inoculation against influenza   15. Primary insomnia   16. Estrogen deficiency         Plan:  Needs flu shot and pneumovax-23   During the course of the visit the patient was educated and counseled about appropriate screening and preventive services including:     Breast: normal 02/2012 at breast center  Bone:  CRS: Done 05/10/2009 in [place] she thinks she had 1 polyp removed. She does get freq diverticulitis Pt was referred to Dr. Benson Norway 18 mos prior  Immunizations: flu shot annually, prevnar-13 done 1 yr prior 2016, tdap done 2014   Pap done at 65 yo and was neg    Needs zostavax rx Refer for dexa scan and mammogram  Diet review for nutrition referral? Yes ____  Not Indicated __x__   Patient Instructions (the written plan) was given to the patient.  Medicare Attestation I have personally reviewed: The patient's medical and social history Their use of alcohol, tobacco or illicit drugs Their current medications and supplements The patient's functional ability including ADLs,fall risks, home safety risks, cognitive, and hearing and visual impairment Diet and physical activities Evidence for depression or mood disorders  The patient's weight, height, BMI, and visual acuity have been recorded in the chart.  I have made referrals, counseling, and provided education to the patient based on  review of the above and I have provided the patient with a written personalized care plan for preventive services.     Delman Cheadle, MD   10/29/2016     Orders Placed This Encounter  Procedures  . MM Digital Screening    Standing Status:   Future    Standing Expiration Date:   12/29/2017    Order Specific Question:   Reason for Exam (SYMPTOM  OR DIAGNOSIS REQUIRED)    Answer:   screening    Order Specific Question:   Preferred imaging location?    Answer:   [facility]  . DG Bone Density    Standing Status:   Future    Standing Expiration Date:   12/29/2017    Order Specific Question:   Reason for Exam (SYMPTOM  OR DIAGNOSIS REQUIRED)    Answer:   screening    Order Specific Question:   Preferred imaging location?    Answer:   [facility]  . Pneumococcal polysaccharide vaccine 23-valent greater than or equal to 2yo subcutaneous/IM  . Flu Vaccine QUAD 36+ mos IM  . TSH  . Vitamin B12  . VITAMIN D 25 Hydroxy (Vit-D Deficiency, Fractures)  . CBC  . Lipid panel    Order Specific Question:   Has the patient fasted?    Answer:   Yes  . Comprehensive metabolic panel    Order Specific Question:   Has the patient fasted?    Answer:   Yes  .  Ambulatory Referral for Lung Cancer Scre    Referral Priority:   Routine    Referral Type:   Consultation    Referral Reason:   Specialty Services Required    Number of Visits Requested:   1  . POCT urinalysis dipstick    Meds ordered this encounter  Medications  . valACYclovir (VALTREX) 1000 MG tablet    Sig: TAKE 1/2 TABLET DAILY OR AS DIRECTED.    Dispense:  45 tablet    Refill:  3  . traZODone (DESYREL) 50 MG tablet    Sig: Take 1-2 tablets (50-100 mg total) by mouth at bedtime.    Dispense:  180 tablet    Refill:  2  . PARoxetine (PAXIL) 20 MG tablet    Sig: Take 1 tablet (20 mg total) by mouth daily. qhs    Dispense:  90 tablet    Refill:  3  . buPROPion (WELLBUTRIN XL) 150 MG 24 hr tablet    Sig: Take 1 tablet (150 mg  total) by mouth daily.    Dispense:  90 tablet    Refill:  3  . levothyroxine (SYNTHROID, LEVOTHROID) 50 MCG tablet    Sig: TAKE 1 TABLET (50 MCG TOTAL) BY MOUTH DAILY.    Dispense:  90 tablet    Refill:  3  . Zoster Vaccine Live, PF, (ZOSTAVAX) 29562 UNT/0.65ML injection    Sig: Inject 19,400 Units into the skin once.    Dispense:  1 vial    Refill:  0     Delman Cheadle, M.D.  [facility] [address] [place], [place] [address] [phone] phone [phone] fax  11/20/16 9:27 AM

## 2016-10-31 ENCOUNTER — Ambulatory Visit (INDEPENDENT_AMBULATORY_CARE_PROVIDER_SITE_OTHER): Payer: Medicare Other | Admitting: Physician Assistant

## 2016-10-31 VITALS — BP 102/68 | HR 69 | Temp 97.6°F | Resp 16 | Ht 65.75 in | Wt 112.0 lb

## 2016-10-31 DIAGNOSIS — D72819 Decreased white blood cell count, unspecified: Secondary | ICD-10-CM | POA: Diagnosis not present

## 2016-10-31 DIAGNOSIS — L03113 Cellulitis of right upper limb: Secondary | ICD-10-CM | POA: Diagnosis not present

## 2016-10-31 LAB — POCT CBC
GRANULOCYTE PERCENT: 66.3 % (ref 37–80)
HEMATOCRIT: 34.1 % — AB (ref 37.7–47.9)
Hemoglobin: 12.4 g/dL (ref 12.2–16.2)
Lymph, poc: 1.4 (ref 0.6–3.4)
MCH: 34.6 pg — AB (ref 27–31.2)
MCHC: 36.5 g/dL — AB (ref 31.8–35.4)
MCV: 94.8 fL (ref 80–97)
MID (CBC): 0.5 (ref 0–0.9)
MPV: 6.9 fL (ref 0–99.8)
POC GRANULOCYTE: 3.8 (ref 2–6.9)
POC LYMPH PERCENT: 25.2 %L (ref 10–50)
POC MID %: 8.5 % (ref 0–12)
Platelet Count, POC: 160 10*3/uL (ref 142–424)
RBC: 3.6 M/uL — AB (ref 4.04–5.48)
RDW, POC: 13 %
WBC: 5.7 10*3/uL (ref 4.6–10.2)

## 2016-10-31 MED ORDER — CEPHALEXIN 500 MG PO CAPS: 500.0000 mg | ORAL_CAPSULE | Freq: Three times a day (TID) | ORAL | 0 refills | Status: DC

## 2016-10-31 NOTE — Progress Notes (Signed)
10/31/2016 9:59 AM   DOB: 10-13-1951 / MRN: [id number]  SUBJECTIVE:  Kristine Davidson is a 65 y.o. female presenting for pain after a pneumonia shot. Reports the pain is in her axilla and she associates redness and skin tenderness.  Reports she did not feel all that well yesterday and had a mild fever.  Reports the muscle where she had the shot is also sore however denies redness about this area.   She complains of a one to two year history of leukopenia and would like some follow up on this test.    She is allergic to codeine.   She  has a past medical history of Anemia; Depression; Diverticular disease; HSV-2 (herpes simplex virus 2) infection; Hyperlipidemia; Hypertension; Mitral valve prolapse; Substance abuse; and Thyroid disease.    She  reports that she has been smoking Cigarettes.  She has a 21.50 pack-year smoking history. She has never used smokeless tobacco. She reports that she does not drink alcohol or use drugs. She  reports that she does not engage in sexual activity. The patient  has a past surgical history that includes Tonsillectomy; D and C; and Varicose vein surgery (2000).  Her family history includes Cancer in her mother; Diabetes in her sister; Macular degeneration in her mother; Parkinson's disease in her father and sister; Stroke in her mother.  Review of Systems  Constitutional: Negative for chills and fever.  Respiratory: Negative for cough.   Skin: Positive for rash. Negative for itching.  Neurological: Negative for dizziness.    The problem list and medications were reviewed and updated by myself where necessary and exist elsewhere in the encounter.   OBJECTIVE:  BP 102/68   Pulse 69   Temp 97.6 F (36.4 C) (Oral)   Resp 16   Ht 5' 5.75" (1.67 m)   Wt 112 lb (50.8 kg)   SpO2 99%   BMI 18.22 kg/m   Physical Exam  Constitutional: She is oriented to person, place, and time.  Cardiovascular: Normal rate, regular rhythm and normal heart sounds.     Pulmonary/Chest: Effort normal and breath sounds normal.  Abdominal: Soft. Bowel sounds are normal.  Musculoskeletal: Normal range of motion.  Neurological: She is alert and oriented to person, place, and time.  Skin: Skin is warm and dry. Rash (Warm well demarcated rash with tenderness about the right axilla. ) noted.  Psychiatric: She has a normal mood and affect.       Wt Readings from Last 3 Encounters:  10/31/16 112 lb (50.8 kg)  10/29/16 112 lb 6.4 oz (51 kg)  02/18/16 106 lb 12.8 oz (48.4 kg)     Results for orders placed or performed in visit on 10/31/16 (from the past 72 hour(s))  POCT CBC     Status: Abnormal   Collection Time: 10/31/16  9:58 AM  Result Value Ref Range   WBC 5.7 4.6 - 10.2 K/uL   Lymph, poc 1.4 0.6 - 3.4   POC LYMPH PERCENT 25.2 10 - 50 %L   MID (cbc) 0.5 0 - 0.9   POC MID % 8.5 0 - 12 %M   POC Granulocyte 3.8 2 - 6.9   Granulocyte percent 66.3 37 - 80 %G   RBC 3.60 (A) 4.04 - 5.48 M/uL   Hemoglobin 12.4 12.2 - 16.2 g/dL   HCT, POC 34.1 (A) 37.7 - 47.9 %   MCV 94.8 80 - 97 fL   MCH, POC 34.6 (A) 27 - 31.2 pg  MCHC 36.5 (A) 31.8 - 35.4 g/dL   RDW, POC 13.0 %   Platelet Count, POC 160 142 - 424 K/uL   MPV 6.9 0 - 99.8 fL    No results found.  ASSESSMENT AND PLAN  Decker was seen today for arm pain.  Diagnoses and all orders for this visit:  Cellulitis of right upper extremity: She has some tenderness about the area where she received the shot however it does appear that she has a new area of redness and pain (see photo) in the right axilla.  This is warm and tender.  I am starting her on keflex today. Will see her back on Monday or Tuesday if not improving.  ED if worse tomorrow.  -     POCT CBC -     cephALEXin (KEFLEX) 500 MG capsule; Take 1 capsule (500 mg total) by mouth 3 (three) times daily.  Chronic Leukopenia: Peripheral smear.   The patient is advised to call or return to clinic if she does not see an improvement in symptoms,  or to seek the care of the closest emergency department if she worsens with the above plan.   Philis Fendt, MHS, PA-C [facility] 10/31/2016 9:59 AM

## 2016-10-31 NOTE — Patient Instructions (Signed)
We recommend that you schedule a mammogram for breast cancer screening. Typically, you do not need a referral to do this. Please contact a local imaging center to schedule your mammogram.  [facility] - [phone]  *ask for the Radiology Department [facility] - [phone] or [phone]  [facility] - [phone] [facility] - [phone] [facility] - [phone]  *ask for the Radiology Department [facility] - [phone]  *ask for the Radiology Department [facility] - [phone]  *ask for the Mammography Department [facility] - [phone] 

## 2016-11-02 ENCOUNTER — Telehealth: Payer: Self-pay

## 2016-11-02 LAB — PATHOLOGIST SMEAR REVIEW

## 2016-11-02 NOTE — Progress Notes (Signed)
Peripheral smear unremarkable and no further testing required. Please send a letter.

## 2016-11-02 NOTE — Telephone Encounter (Signed)
  Oncology Nurse Navigator Documentation Received referral for Lung Cancer Screening. Voicemail left with Ms. Akhtar to return call regarding.  Navigator Location: [facility] (11/02/16 1300)   )Navigator Encounter Type: Telephone;Screening (11/02/16 1300) Telephone: [place] Call (11/02/16 1300)                       Barriers/Navigation Needs: Coordination of Care (11/02/16 1300)   Interventions: Coordination of Care (11/02/16 1300)   Coordination of Care:  (LDCT for lung cancer screeening) (11/02/16 1300)                  Time Spent with Patient: 15 (11/02/16 1300)

## 2016-11-17 ENCOUNTER — Telehealth: Payer: Self-pay | Admitting: *Deleted

## 2016-11-17 NOTE — Telephone Encounter (Signed)
Received referral for low dose lung cancer screening CT scan. Voicemail left at phone number listed in EMR for patient to call me back to facilitate scheduling scan.  

## 2016-11-20 DIAGNOSIS — D239 Other benign neoplasm of skin, unspecified: Secondary | ICD-10-CM | POA: Diagnosis not present

## 2016-11-20 DIAGNOSIS — L821 Other seborrheic keratosis: Secondary | ICD-10-CM | POA: Diagnosis not present

## 2016-11-24 ENCOUNTER — Telehealth: Payer: Self-pay | Admitting: *Deleted

## 2016-11-24 NOTE — Telephone Encounter (Signed)
Received referral for initial lung cancer screening scan. Contacted patient and obtained smoking history. Patient would like to wait until January to have screening scan.

## 2016-12-22 ENCOUNTER — Telehealth: Payer: Self-pay | Admitting: *Deleted

## 2016-12-22 NOTE — Telephone Encounter (Signed)
Received referral for low dose lung cancer screening CT scan. Voicemail left at phone number listed in EMR for patient to call me back to facilitate scheduling scan. Also, noted patient lives in [place]. Message left informed patient that screening is available in [place] as well.

## 2017-02-22 ENCOUNTER — Encounter: Payer: Self-pay | Admitting: *Deleted

## 2017-07-24 ENCOUNTER — Other Ambulatory Visit: Payer: Self-pay | Admitting: Family Medicine

## 2017-09-01 ENCOUNTER — Other Ambulatory Visit: Payer: Self-pay | Admitting: Family Medicine

## 2017-09-01 DIAGNOSIS — Z1231 Encounter for screening mammogram for malignant neoplasm of breast: Secondary | ICD-10-CM

## 2017-09-15 ENCOUNTER — Ambulatory Visit
Admission: RE | Admit: 2017-09-15 | Discharge: 2017-09-15 | Disposition: A | Discharge status: Home / Self Care | Payer: Medicare Other | Source: Ambulatory Visit | Attending: Family Medicine | Admitting: Family Medicine

## 2017-09-15 DIAGNOSIS — Z1231 Encounter for screening mammogram for malignant neoplasm of breast: Secondary | ICD-10-CM

## 2017-10-11 ENCOUNTER — Ambulatory Visit (INDEPENDENT_AMBULATORY_CARE_PROVIDER_SITE_OTHER): Payer: Medicare Other | Admitting: Physician Assistant

## 2017-10-11 ENCOUNTER — Ambulatory Visit (INDEPENDENT_AMBULATORY_CARE_PROVIDER_SITE_OTHER): Payer: Medicare Other

## 2017-10-11 DIAGNOSIS — K5900 Constipation, unspecified: Secondary | ICD-10-CM

## 2017-10-11 DIAGNOSIS — R1032 Left lower quadrant pain: Secondary | ICD-10-CM | POA: Diagnosis not present

## 2017-10-11 DIAGNOSIS — R197 Diarrhea, unspecified: Secondary | ICD-10-CM

## 2017-10-11 LAB — POCT URINALYSIS DIP (MANUAL ENTRY)
BILIRUBIN UA: NEGATIVE
BILIRUBIN UA: NEGATIVE mg/dL
Blood, UA: NEGATIVE
GLUCOSE UA: NEGATIVE mg/dL
LEUKOCYTES UA: NEGATIVE
NITRITE UA: NEGATIVE
Protein Ur, POC: NEGATIVE mg/dL
Spec Grav, UA: <=1.005 — AB (ref 1.010–1.025)
Urobilinogen, UA: 0.2 E.U./dL
pH, UA: 5.5 (ref 5.0–8.0)

## 2017-10-11 LAB — POCT CBC
GRANULOCYTE PERCENT: 57.9 % (ref 37–80)
HCT, POC: 37.5 % — AB (ref 37.7–47.9)
HEMOGLOBIN: 12.7 g/dL (ref 12.2–16.2)
Lymph, poc: 1.5 (ref 0.6–3.4)
MCH, POC: 33 pg — AB (ref 27–31.2)
MCHC: 33.9 g/dL (ref 31.8–35.4)
MCV: 97.3 fL — AB (ref 80–97)
MID (cbc): 0.3 (ref 0–0.9)
MPV: 6.5 fL (ref 0–99.8)
PLATELET COUNT, POC: 184 10*3/uL (ref 142–424)
POC Granulocyte: 2.4 (ref 2–6.9)
POC LYMPH %: 35.7 % (ref 10–50)
POC MID %: 6.4 %M (ref 0–12)
RBC: 3.85 M/uL — AB (ref 4.04–5.48)
RDW, POC: 13 %
WBC: 4.2 10*3/uL — AB (ref 4.6–10.2)

## 2017-10-11 MED ORDER — VALACYCLOVIR HCL 1 G PO TABS: ORAL_TABLET | ORAL | 3 refills | Status: DC

## 2017-10-11 MED ORDER — TRAZODONE HCL 50 MG PO TABS: 50.0000 mg | ORAL_TABLET | Freq: Every day | ORAL | 0 refills | Status: DC

## 2017-10-11 MED ORDER — POLYETHYLENE GLYCOL 3350 17 GM/SCOOP PO POWD: 17.0000 g | Freq: Two times a day (BID) | ORAL | 1 refills | Status: DC | PRN

## 2017-10-11 NOTE — Progress Notes (Signed)
PRIMARY CARE AT [place], [place] [address] [phone]  Date:  10/11/2017   Name:  Kristine Davidson   DOB:  1951-10-16   MRN:  [id number]  PCP:  Patient, No Pcp Per    History of Present Illness:  Kristine Davidson is a 66 y.o. female patient who presents to PCP with  Chief Complaint  Patient presents with  . Diverticulitis    pt states when she has a bm, her bowels are not feeling empty/ x2 days  . Abdominal Pain     For 2 days, patient has had pain on the left lower side.  She feels constipated like her bowels are not emptying completely.  Some diarrhea without blood twice per day in the last 2 days.  No nausea.  Her appetite is normal.  She has no fever, but has some malaise.     Diverticulitis hx several years ago.  She is taking her valtrex, but may be concerned of another pre-outbreak.    Patient Active Problem List   Diagnosis Date Noted  . Hypertension 10/29/2016  . Hyperlipidemia 10/29/2016  . Anemia 10/29/2016  . Depression with anxiety 02/28/2015  . HSV-2 (herpes simplex virus 2) infection 03/31/2012  . B12 deficiency 03/31/2012  . Vitamin D deficiency disease 03/31/2012  . H/O diverticulitis of colon 02/17/2012  . Hypothyroid 02/17/2012    Past Medical History:  Diagnosis Date  . Anemia   . Depression   . Diverticular disease   . HSV-2 (herpes simplex virus 2) infection   . Hyperlipidemia   . Hypertension   . Mitral valve prolapse   . Substance abuse   . Thyroid disease     Past Surgical History:  Procedure Laterality Date  . D and C    . TONSILLECTOMY    . VARICOSE VEIN SURGERY  2000    Social History  Substance Use Topics  . Smoking status: Current Every Day Smoker    Packs/day: 0.50    Years: 43.00    Types: Cigarettes  . Smokeless tobacco: Never Used  . Alcohol use No    Family History  Problem Relation Age of Onset  . Cancer Mother   . Stroke Mother   . Macular degeneration Mother   . Parkinson's disease Father    . Parkinson's disease Sister   . Diabetes Sister     Allergies  Allergen Reactions  . Codeine Itching    Medication list has been reviewed and updated.  Current Outpatient Prescriptions on File Prior to Visit  Medication Sig Dispense Refill  . buPROPion (WELLBUTRIN XL) 150 MG 24 hr tablet Take 1 tablet (150 mg total) by mouth daily. 90 tablet 3  . cholecalciferol (VITAMIN D) 1000 UNITS tablet Take 2,000 Units by mouth daily.     Marland Kitchen levothyroxine (SYNTHROID, LEVOTHROID) 50 MCG tablet TAKE 1 TABLET (50 MCG TOTAL) BY MOUTH DAILY. 90 tablet 3  . PARoxetine (PAXIL) 20 MG tablet Take 1 tablet (20 mg total) by mouth daily. qhs 90 tablet 3  . traZODone (DESYREL) 50 MG tablet Take 1-2 tablets (50-100 mg total) by mouth at bedtime. 180 tablet 2  . valACYclovir (VALTREX) 1000 MG tablet TAKE 1/2 TABLET DAILY OR AS DIRECTED. 45 tablet 3  . vitamin B-12 (CYANOCOBALAMIN) 250 MCG tablet Take 250 mcg by mouth daily.     No current facility-administered medications on file prior to visit.     ROS ROS otherwise unremarkable unless listed above.  Physical Examination: There  were no vitals taken for this visit. Ideal Body Weight:    Physical Exam  Constitutional: She is oriented to person, place, and time. She appears well-developed and well-nourished. No distress.  HENT:  Head: Normocephalic and atraumatic.  Right Ear: External ear normal.  Left Ear: External ear normal.  Eyes: Pupils are equal, round, and reactive to light. Conjunctivae and EOM are normal.  Cardiovascular: Normal rate, regular rhythm and intact distal pulses.  Exam reveals no friction rub.   No murmur heard. Pulmonary/Chest: Effort normal. No respiratory distress.  Abdominal:  Tender along the right upper, epigastric, left umbilical.    Neurological: She is alert and oriented to person, place, and time.  Skin: She is not diaphoretic.  Psychiatric: She has a normal mood and affect. Her behavior is normal.    Results for  orders placed or performed in visit on 10/11/17  POCT CBC  Result Value Ref Range   WBC 4.2 (A) 4.6 - 10.2 K/uL   Lymph, poc 1.5 0.6 - 3.4   POC LYMPH PERCENT 35.7 10 - 50 %L   MID (cbc) 0.3 0 - 0.9   POC MID % 6.4 0 - 12 %M   POC Granulocyte 2.4 2 - 6.9   Granulocyte percent 57.9 37 - 80 %G   RBC 3.85 (A) 4.04 - 5.48 M/uL   Hemoglobin 12.7 12.2 - 16.2 g/dL   HCT, POC 37.5 (A) 37.7 - 47.9 %   MCV 97.3 (A) 80 - 97 fL   MCH, POC 33.0 (A) 27 - 31.2 pg   MCHC 33.9 31.8 - 35.4 g/dL   RDW, POC 13.0 %   Platelet Count, POC 184 142 - 424 K/uL   MPV 6.5 0 - 99.8 fL  POCT urinalysis dipstick  Result Value Ref Range   Color, UA yellow yellow   Clarity, UA clear clear   Glucose, UA negative negative mg/dL   Bilirubin, UA negative negative   Ketones, POC UA negative negative mg/dL   Spec Grav, UA <=1.005 (A) 1.010 - 1.025   Blood, UA negative negative   pH, UA 5.5 5.0 - 8.0   Protein Ur, POC negative negative mg/dL   Urobilinogen, UA 0.2 0.2 or 1.0 E.U./dL   Nitrite, UA Negative Negative   Leukocytes, UA Negative Negative   Dg Abd 1 View  Result Date: 10/11/2017 CLINICAL DATA:  Left lower quadrant abdominal pain EXAM: ABDOMEN - 1 VIEW COMPARISON:  12/04/2013 FINDINGS: Visible lung bases are clear. Scoliosis of the spine. Nonobstructed bowel-gas pattern with mild stool in the distal colon. Calcified phleboliths in the pelvis. Increased density at the right L4-L5 articulation likely degenerative. IMPRESSION: Nonobstructed bowel-gas pattern with mild stool in the distal colon Electronically Signed   By: Donavan Foil M.D.   On: 10/11/2017 15:44    Assessment and Plan: Kristine Davidson is a 66 y.o. female who is here today for cc of  Chief Complaint  Patient presents with  . Diverticulitis    pt states when she has a bm, her bowels are not feeling empty/ x2 days  . Abdominal Pain  --will treat for constipation and advised for short tx of valacyclovir for outbreak.  She will rtc for  alarming sxs that warrant a prompt return as discussed. Constipation, unspecified constipation type - Plan: polyethylene glycol powder (GLYCOLAX/MIRALAX) powder  Abdominal pain, left lower quadrant - Plan: POCT CBC, POCT urinalysis dipstick, DG Abd 1 View, polyethylene glycol powder (GLYCOLAX/MIRALAX) powder  Diarrhea, unspecified type - Plan:  POCT urinalysis dipstick  Ivar Drape, PA-C [facility] 10/22/20183:51 PM

## 2017-10-11 NOTE — Patient Instructions (Addendum)
Do not take the miralax longer than 1 week.   It is fine to take the valayclovir as if you have had an outbreak.   Constipation, Adult Constipation is when a person:  Poops (has a bowel movement) fewer times in a week than normal.  Has a hard time pooping.  Has poop that is dry, hard, or bigger than normal.  Follow these instructions at home: Eating and drinking   Eat foods that have a lot of fiber, such as: ? Fresh fruits and vegetables. ? Whole grains. ? Beans.  Eat less of foods that are high in fat, low in fiber, or overly processed, such as: ? [place] fries. ? Hamburgers. ? Cookies. ? Candy. ? Soda.  Drink enough fluid to keep your pee (urine) clear or pale yellow. General instructions  Exercise regularly or as told by your doctor.  Go to the restroom when you feel like you need to poop. Do not hold it in.  Take over-the-counter and prescription medicines only as told by your doctor. These include any fiber supplements.  Do pelvic floor retraining exercises, such as: ? Doing deep breathing while relaxing your lower belly (abdomen). ? Relaxing your pelvic floor while pooping.  Watch your condition for any changes.  Keep all follow-up visits as told by your doctor. This is important. Contact a doctor if:  You have pain that gets worse.  You have a fever.  You have not pooped for 4 days.  You throw up (vomit).  You are not hungry.  You lose weight.  You are bleeding from the anus.  You have thin, pencil-like poop (stool). Get help right away if:  You have a fever, and your symptoms suddenly get worse.  You leak poop or have blood in your poop.  Your belly feels hard or bigger than normal (is bloated).  You have very bad belly pain.  You feel dizzy or you faint. This information is not intended to replace advice given to you by your health care provider. Make sure you discuss any questions you have with your health care provider. Document  Released: 05/25/2008 Document Revised: 06/26/2016 Document Reviewed: 05/27/2016 Elsevier Interactive Patient Education  2017 [employer].     IF you received an x-ray today, you will receive an invoice from [facility]. Please contact [facility] at [phone] with questions or concerns regarding your invoice.   IF you received labwork today, you will receive an invoice from [facility]. Please contact [facility] at [phone] with questions or concerns regarding your invoice.   Our billing staff will not be able to assist you with questions regarding bills from these companies.  You will be contacted with the lab results as soon as they are available. The fastest way to get your results is to activate your My Chart account. Instructions are located on the last page of this paperwork. If you have not heard from [place] regarding the results in 2 weeks, please contact this office.

## 2017-10-12 ENCOUNTER — Encounter: Payer: Self-pay | Admitting: Physician Assistant

## 2017-10-18 ENCOUNTER — Ambulatory Visit: Payer: Medicare Other | Admitting: Physician Assistant

## 2017-11-02 ENCOUNTER — Ambulatory Visit (INDEPENDENT_AMBULATORY_CARE_PROVIDER_SITE_OTHER): Payer: Medicare Other

## 2017-11-02 VITALS — BP 122/80 | HR 97 | Ht 66.0 in | Wt 114.0 lb

## 2017-11-02 DIAGNOSIS — Z0000 Encounter for general adult medical examination without abnormal findings: Secondary | ICD-10-CM | POA: Diagnosis not present

## 2017-11-02 DIAGNOSIS — Z23 Encounter for immunization: Secondary | ICD-10-CM

## 2017-11-02 NOTE — Patient Instructions (Addendum)
Kristine Davidson , Thank you for taking time to come for your Medicare Wellness Visit. I appreciate your ongoing commitment to your health goals. Please review the following plan we discussed and let me know if I can assist you in the future.   Screening recommendations/referrals: Colonoscopy: up to date, next due 05/11/2019 Mammogram: up to date, next due 09/16/2019 Bone Density: declined Recommended yearly ophthalmology/optometry visit for glaucoma screening and checkup Recommended yearly dental visit for hygiene and checkup  Vaccinations: Influenza vaccine: administered today  Pneumococcal vaccine: up to date Tdap vaccine: up to date, next due 08/26/2023 Shingles vaccine: Check with your pharmacy about receiving this vaccine    Advanced directives: Please bring a copy of your POA (Power of Lane) and/or Living Will to your next appointment.   Conditions/risks identified: Try to start back exercising more regularly.   Next appointment: schedule follow up visit with PCP, 1 year for AWV     Preventive Care 61 Years and Older, Female Preventive care refers to lifestyle choices and visits with your health care provider that can promote health and wellness. What does preventive care include?  A yearly physical exam. This is also called an annual well check.  Dental exams once or twice a year.  Routine eye exams. Ask your health care provider how often you should have your eyes checked.  Personal lifestyle choices, including:  Daily care of your teeth and gums.  Regular physical activity.  Eating a healthy diet.  Avoiding tobacco and drug use.  Limiting alcohol use.  Practicing safe sex.  Taking low-dose aspirin every day.  Taking vitamin and mineral supplements as recommended by your health care provider. What happens during an annual well check? The services and screenings done by your health care provider during your annual well check will depend on your age, overall  health, lifestyle risk factors, and family history of disease. Counseling  Your health care provider may ask you questions about your:  Alcohol use.  Tobacco use.  Drug use.  Emotional well-being.  Home and relationship well-being.  Sexual activity.  Eating habits.  History of falls.  Memory and ability to understand (cognition).  Work and work Statistician.  Reproductive health. Screening  You may have the following tests or measurements:  Height, weight, and BMI.  Blood pressure.  Lipid and cholesterol levels. These may be checked every 5 years, or more frequently if you are over 53 years old.  Skin check.  Lung cancer screening. You may have this screening every year starting at age 78 if you have a 30-pack-year history of smoking and currently smoke or have quit within the past 15 years.  Fecal occult blood test (FOBT) of the stool. You may have this test every year starting at age 54.  Flexible sigmoidoscopy or colonoscopy. You may have a sigmoidoscopy every 5 years or a colonoscopy every 10 years starting at age 20.  Hepatitis C blood test.  Hepatitis B blood test.  Sexually transmitted disease (STD) testing.  Diabetes screening. This is done by checking your blood sugar (glucose) after you have not eaten for a while (fasting). You may have this done every 1-3 years.  Bone density scan. This is done to screen for osteoporosis. You may have this done starting at age 66.  Mammogram. This may be done every 1-2 years. Talk to your health care provider about how often you should have regular mammograms. Talk with your health care provider about your test results, treatment options, and if  necessary, the need for more tests. Vaccines  Your health care provider may recommend certain vaccines, such as:  Influenza vaccine. This is recommended every year.  Tetanus, diphtheria, and acellular pertussis (Tdap, Td) vaccine. You may need a Td booster every 10  years.  Zoster vaccine. You may need this after age 20.  Pneumococcal 13-valent conjugate (PCV13) vaccine. One dose is recommended after age 62.  Pneumococcal polysaccharide (PPSV23) vaccine. One dose is recommended after age 70. Talk to your health care provider about which screenings and vaccines you need and how often you need them. This information is not intended to replace advice given to you by your health care provider. Make sure you discuss any questions you have with your health care provider. Document Released: 01/03/2016 Document Revised: 08/26/2016 Document Reviewed: 10/08/2015 Elsevier Interactive Patient Education  2017 [place] Prevention in the Home Falls can cause injuries. They can happen to people of all ages. There are many things you can do to make your home safe and to help prevent falls. What can I do on the outside of my home?  Regularly fix the edges of walkways and driveways and fix any cracks.  Remove anything that might make you trip as you walk through a door, such as a raised step or threshold.  Trim any bushes or trees on the path to your home.  Use bright outdoor lighting.  Clear any walking paths of anything that might make someone trip, such as rocks or tools.  Regularly check to see if handrails are loose or broken. Make sure that both sides of any steps have handrails.  Any raised decks and porches should have guardrails on the edges.  Have any leaves, snow, or ice cleared regularly.  Use sand or salt on walking paths during winter.  Clean up any spills in your garage right away. This includes oil or grease spills. What can I do in the bathroom?  Use night lights.  Install grab bars by the toilet and in the tub and shower. Do not use towel bars as grab bars.  Use non-skid mats or decals in the tub or shower.  If you need to sit down in the shower, use a plastic, non-slip stool.  Keep the floor dry. Clean up any water that  spills on the floor as soon as it happens.  Remove soap buildup in the tub or shower regularly.  Attach bath mats securely with double-sided non-slip rug tape.  Do not have throw rugs and other things on the floor that can make you trip. What can I do in the bedroom?  Use night lights.  Make sure that you have a light by your bed that is easy to reach.  Do not use any sheets or blankets that are too big for your bed. They should not hang down onto the floor.  Have a firm chair that has side arms. You can use this for support while you get dressed.  Do not have throw rugs and other things on the floor that can make you trip. What can I do in the kitchen?  Clean up any spills right away.  Avoid walking on wet floors.  Keep items that you use a lot in easy-to-reach places.  If you need to reach something above you, use a strong step stool that has a grab bar.  Keep electrical cords out of the way.  Do not use floor polish or wax that makes floors slippery. If you must  use wax, use non-skid floor wax.  Do not have throw rugs and other things on the floor that can make you trip. What can I do with my stairs?  Do not leave any items on the stairs.  Make sure that there are handrails on both sides of the stairs and use them. Fix handrails that are broken or loose. Make sure that handrails are as long as the stairways.  Check any carpeting to make sure that it is firmly attached to the stairs. Fix any carpet that is loose or worn.  Avoid having throw rugs at the top or bottom of the stairs. If you do have throw rugs, attach them to the floor with carpet tape.  Make sure that you have a light switch at the top of the stairs and the bottom of the stairs. If you do not have them, ask someone to add them for you. What else can I do to help prevent falls?  Wear shoes that:  Do not have high heels.  Have rubber bottoms.  Are comfortable and fit you well.  Are closed at the  toe. Do not wear sandals.  If you use a stepladder:  Make sure that it is fully opened. Do not climb a closed stepladder.  Make sure that both sides of the stepladder are locked into place.  Ask someone to hold it for you, if possible.  Clearly mark and make sure that you can see:  Any grab bars or handrails.  First and last steps.  Where the edge of each step is.  Use tools that help you move around (mobility aids) if they are needed. These include:  Canes.  Walkers.  Scooters.  Crutches.  Turn on the lights when you go into a dark area. Replace any light bulbs as soon as they burn out.  Set up your furniture so you have a clear path. Avoid moving your furniture around.  If any of your floors are uneven, fix them.  If there are any pets around you, be aware of where they are.  Review your medicines with your doctor. Some medicines can make you feel dizzy. This can increase your chance of falling. Ask your doctor what other things that you can do to help prevent falls. This information is not intended to replace advice given to you by your health care provider. Make sure you discuss any questions you have with your health care provider. Document Released: 10/03/2009 Document Revised: 05/14/2016 Document Reviewed: 01/11/2015 Elsevier Interactive Patient Education  2017 [employer].

## 2017-11-02 NOTE — Progress Notes (Signed)
Subjective:   Kristine Davidson is a 66 y.o. female who presents for Medicare Annual (Subsequent) preventive examination.  Review of Systems:  N/A Cardiac Risk Factors include: advanced age (>46men, >66 women);dyslipidemia;hypertension;smoking/ tobacco exposure     Objective:     Vitals: BP 122/80   Pulse 97   Ht 5\' 6"  (1.676 m)   Wt 114 lb (51.7 kg)   SpO2 98%   BMI 18.40 kg/m   Body mass index is 18.4 kg/m.   Tobacco Social History   Tobacco Use  Smoking Status Current Every Day Smoker  . Packs/day: 0.50  . Years: 43.00  . Pack years: 21.50  . Types: Cigarettes  Smokeless Tobacco Never Used     Ready to quit: No Counseling given: Not Answered   Past Medical History:  Diagnosis Date  . Anemia   . Depression   . Diverticular disease   . HSV-2 (herpes simplex virus 2) infection   . Hyperlipidemia   . Hypertension   . Mitral valve prolapse   . Substance abuse ([place])   . Thyroid disease    Past Surgical History:  Procedure Laterality Date  . D and C    . TONSILLECTOMY    . VARICOSE VEIN SURGERY  2000   Family History  Problem Relation Age of Onset  . Cancer Mother   . Stroke Mother   . Macular degeneration Mother   . Parkinson's disease Father   . Parkinson's disease Sister   . Diabetes Sister    Social History   Substance and Sexual Activity  Sexual Activity No    Outpatient Encounter Medications as of 11/02/2017  Medication Sig  . buPROPion (WELLBUTRIN XL) 150 MG 24 hr tablet Take 1 tablet (150 mg total) by mouth daily.  . cholecalciferol (VITAMIN D) 1000 UNITS tablet Take 2,000 Units by mouth daily.   Marland Kitchen levothyroxine (SYNTHROID, LEVOTHROID) 50 MCG tablet TAKE 1 TABLET (50 MCG TOTAL) BY MOUTH DAILY.  Marland Kitchen PARoxetine (PAXIL) 20 MG tablet Take 1 tablet (20 mg total) by mouth daily. qhs  . traZODone (DESYREL) 50 MG tablet Take 1-2 tablets (50-100 mg total) by mouth at bedtime.  . valACYclovir (VALTREX) 1000 MG tablet TAKE 1/2 TABLET DAILY OR AS  DIRECTED.  Marland Kitchen vitamin B-12 (CYANOCOBALAMIN) 250 MCG tablet Take 250 mcg by mouth daily.  . [DISCONTINUED] polyethylene glycol powder (GLYCOLAX/MIRALAX) powder Take 17 g by mouth 2 (two) times daily as needed.   No facility-administered encounter medications on file as of 11/02/2017.     Activities of Daily Living In your present state of health, do you have any difficulty performing the following activities: 11/02/2017  Hearing? Y  Comment Patient has issues with wax buildup  Vision? N  Difficulty concentrating or making decisions? Y  Comment Patient has some concentrating and memory issues.   Walking or climbing stairs? N  Dressing or bathing? N  Doing errands, shopping? N  Preparing Food and eating ? N  Using the Toilet? N  In the past six months, have you accidently leaked urine? Y  Comment Patient has some urine leakage at times.  Do you have problems with loss of bowel control? Y  Comment Patient has loss of bowel control at times  Managing your Medications? N  Managing your Finances? N  Housekeeping or managing your Housekeeping? N  Some recent data might be hidden    Patient Care Team: Shawnee Knapp, MD as PCP - General (Family Medicine)    Assessment:  Exercise Activities and Dietary recommendations Current Exercise Habits: The patient does not participate in regular exercise at present, Exercise limited by: None identified  Goals    . Exercise 3x per week (30 min per time)     Patient states that she wants to start back exercising more regularly.       Fall Risk Fall Risk  11/02/2017 10/11/2017 10/31/2016 10/29/2016 05/04/2015  Falls in the past year? No No No No No  Comment Patient states that she doesn't think her balance is as good as it use to be. - - - -   Depression Screen PHQ 2/9 Scores 11/02/2017 10/11/2017 10/31/2016 10/29/2016  PHQ - 2 Score 0 0 0 0     Cognitive Function     6CIT Screen 11/02/2017  What Year? 0 points  What month? 0 points    What time? 0 points  Count back from 20 0 points  Months in reverse 0 points  Repeat phrase 0 points  Total Score 0    Immunization History  Administered Date(s) Administered  . Influenza, Seasonal, Injecte, Preservative Fre 11/24/2012  . Influenza,inj,Quad PF,6+ Mos 12/04/2013, 11/01/2014, 09/30/2015, 10/29/2016, 11/02/2017  . PPD Test 04/06/2012  . Pneumococcal Conjugate-13 09/30/2015  . Pneumococcal Polysaccharide-23 10/23/2010, 10/29/2016  . Tdap 08/25/2013   Screening Tests Health Maintenance  Topic Date Due  . DEXA SCAN  11/02/2018 (Originally 12/28/2013)  . COLONOSCOPY  05/11/2019  . MAMMOGRAM  09/16/2019  . TETANUS/TDAP  08/26/2023  . INFLUENZA VACCINE  Completed  . Hepatitis C Screening  Completed  . PNA vac Low Risk Adult  Completed      Plan:   I have personally reviewed and noted the following in the patient's chart:   . Medical and social history . Use of alcohol, tobacco or illicit drugs  . Current medications and supplements . Functional ability and status . Nutritional status . Physical activity . Advanced directives . List of other physicians . Hospitalizations, surgeries, and ER visits in previous 12 months . Vitals . Screenings to include cognitive, depression, and falls . Referrals and appointments  In addition, I have reviewed and discussed with patient certain preventive protocols, quality metrics, and best practice recommendations. A written personalized care plan for preventive services as well as general preventive health recommendations were provided to patient.    Patient will have blood work done at her follow up visit with Dr. Brigitte Pulse. Patient declined bone density.   1. Encounter for Medicare annual wellness exam  2. Need for immunization against influenza - Flu Vaccine QUAD 6+ mos IM (Fluarix)    Andrez Grime, LPN  [id number]

## 2017-11-09 NOTE — Progress Notes (Deleted)
Subjective:    Patient ID: Kristine Davidson, female    DOB: Jul 16, 1951, 66 y.o.   MRN: [id number]  HPI  AWV done by [facility] 1 wk ago but no labs drawn. Primary Preventative Screenings: Cervical Cancer: NA due to age. Pap done at 66 yo and was neg STI screening: neg Hep C 2014 Breast Cancer: mammogram 08/2017 nml at [facility]: colonoscopy 04/2009 - repeat in 10 years - in [place] she thinks she had 1 polyp removed. She does get freq diverticulitis Pt was referred to Dr. Benson Norway 2.5 yrs prior Tobacco use/EtOH/substances: referred for lung cancer screening last yr but pt never went.  Bone Density:none prior Cardiac: EKG w/ Dr. Percival Spanish 02/2013 - POET scheduled but never done? Weight/Blood sugar/Diet/Exercise: BMI Readings from Last 3 Encounters:  11/02/17 18.40 kg/m  10/31/16 18.22 kg/m  10/29/16 18.28 kg/m   No results found for: HGBA1C OTC/Vit/Supp/Herbal: Dentist/Optho: Immunizations:  Immunization History  Administered Date(s) Administered  . Influenza, Seasonal, Injecte, Preservative Fre 11/24/2012  . Influenza,inj,Quad PF,6+ Mos 12/04/2013, 11/01/2014, 09/30/2015, 10/29/2016, 11/02/2017  . PPD Test 04/06/2012  . Pneumococcal Conjugate-13 09/30/2015  . Pneumococcal Polysaccharide-23 10/23/2010, 10/29/2016  . Tdap 08/25/2013     Chronic Medical Conditions: 1. Hypothyroid: on levothyroxine 50 mcg 2. HTN - diet controlled 3. Vit D def: taking qd vit D supp 4000u - nml last yr 4.  B12 def: on oral b12 supp - nml last yr 5.  H/o anemia: unsure of why or when. 6. HLD: diet controlled - last yr LDL 95, non-HDL 107 7. Tobacco use: smoking 1/2 ppd on wellbutrin  8.  Mood d/o of chronic anxiety and depression: On wellbutrin 150 qd - 10 years-ishg, paxil 20 qd, and trazodone 50 qhs (not taking trazodone?). Sees a therapist and gets med from psych.  [facility] at [facility] where pt went after her sister died.  Dr. Joseph Art had started pt  on it but she isn't getting counseling anymore. 9. Recurrent HSV-2: prn valacyclovir but on suppressive as was having monthly outbreaks.   Review of Systems     Objective:   Physical Exam        Assessment & Plan:  Refer  Again for lung cancer screening - didn't go last year. Needs dexa scan Tsh, cmp, lipid, cmp, ua,  Shingrix 1. Annual physical exam   2. Essential hypertension   3. Hyperlipidemia, unspecified hyperlipidemia type   4. Tobacco use disorder   5. Hypothyroidism, unspecified type   6. HSV-2 (herpes simplex virus 2) infection   7. B12 deficiency   8. Depression with anxiety   9. Anemia, unspecified type   10. Primary insomnia   11. Encounter for screening colonoscopy   12. Screening for colon cancer   13. Postmenopausal estrogen deficiency     Orders Placed This Encounter  Procedures  . DG Bone Density    Standing Status:   Future    Standing Expiration Date:   01/10/2019    Order Specific Question:   Reason for Exam (SYMPTOM  OR DIAGNOSIS REQUIRED)    Answer:   postmenopausal estrogen deficiency    Order Specific Question:   Preferred imaging location?    Answer:   [facility]  . Lipid panel    Order Specific Question:   Has the patient fasted?    Answer:   Yes  . TSH  . CBC with Differential/Platelet  . Comprehensive metabolic panel    Order Specific Question:   Has  the patient fasted?    Answer:   Yes  . Vitamin B12  . Ferritin  . Ambulatory referral to Gastroenterology    Referral Priority:   Routine    Referral Type:   Consultation    Referral Reason:   Specialty Services Required    Number of Visits Requested:   1  . Ambulatory Referral for Lung Cancer Scre    Referral Priority:   Routine    Referral Type:   Consultation    Referral Reason:   Specialty Services Required    Number of Visits Requested:   1  . POCT urinalysis dipstick  . EKG 12-Lead    Meds ordered this encounter  Medications  . traZODone (DESYREL) 50 MG tablet      Sig: Take 1-2 tablets (50-100 mg total) by mouth at bedtime.    Dispense:  180 tablet    Refill:  3  . valACYclovir (VALTREX) 1000 MG tablet    Sig: Take 1 tablet (1,000 mg total) by mouth daily.    Dispense:  90 tablet    Refill:  3  . PARoxetine (PAXIL) 20 MG tablet    Sig: Take 1 tablet (20 mg total) by mouth at bedtime.    Dispense:  90 tablet    Refill:  4  . buPROPion (WELLBUTRIN XL) 150 MG 24 hr tablet    Sig: Take 1 tablet (150 mg total) by mouth daily.    Dispense:  90 tablet    Refill:  4  . Zoster Vaccine Adjuvanted [facility]) injection    Sig: Inject 0.5 mLs into the muscle once for 1 dose. Repeat once in 2-6 months    Dispense:  0.5 mL    Refill:  1    I personally performed the services described in this documentation, which was scribed in my presence. The recorded information has been reviewed and considered, and addended by me as needed.   Delman Cheadle, M.D.  Primary Care at [facility] [address] [place], [place] [address] [phone] phone [phone] fax  11/13/17 1:09 AM

## 2017-11-10 ENCOUNTER — Other Ambulatory Visit: Payer: Self-pay

## 2017-11-10 ENCOUNTER — Encounter: Payer: Self-pay | Admitting: Family Medicine

## 2017-11-10 ENCOUNTER — Ambulatory Visit (INDEPENDENT_AMBULATORY_CARE_PROVIDER_SITE_OTHER): Payer: Medicare Other | Admitting: Family Medicine

## 2017-11-10 VITALS — BP 100/68 | HR 77 | Temp 98.6°F | Resp 16 | Ht 66.0 in | Wt 116.8 lb

## 2017-11-10 DIAGNOSIS — F418 Other specified anxiety disorders: Secondary | ICD-10-CM | POA: Diagnosis not present

## 2017-11-10 DIAGNOSIS — Z0000 Encounter for general adult medical examination without abnormal findings: Secondary | ICD-10-CM

## 2017-11-10 DIAGNOSIS — E785 Hyperlipidemia, unspecified: Secondary | ICD-10-CM

## 2017-11-10 DIAGNOSIS — E538 Deficiency of other specified B group vitamins: Secondary | ICD-10-CM

## 2017-11-10 DIAGNOSIS — F5101 Primary insomnia: Secondary | ICD-10-CM

## 2017-11-10 DIAGNOSIS — F17200 Nicotine dependence, unspecified, uncomplicated: Secondary | ICD-10-CM | POA: Diagnosis not present

## 2017-11-10 DIAGNOSIS — B009 Herpesviral infection, unspecified: Secondary | ICD-10-CM | POA: Diagnosis not present

## 2017-11-10 DIAGNOSIS — Z780 Asymptomatic menopausal state: Secondary | ICD-10-CM | POA: Diagnosis not present

## 2017-11-10 DIAGNOSIS — Z1211 Encounter for screening for malignant neoplasm of colon: Secondary | ICD-10-CM | POA: Diagnosis not present

## 2017-11-10 DIAGNOSIS — I10 Essential (primary) hypertension: Secondary | ICD-10-CM | POA: Diagnosis not present

## 2017-11-10 DIAGNOSIS — E039 Hypothyroidism, unspecified: Secondary | ICD-10-CM | POA: Diagnosis not present

## 2017-11-10 DIAGNOSIS — D649 Anemia, unspecified: Secondary | ICD-10-CM | POA: Diagnosis not present

## 2017-11-10 LAB — POCT URINALYSIS DIP (MANUAL ENTRY)
Bilirubin, UA: NEGATIVE
Blood, UA: NEGATIVE
Glucose, UA: NEGATIVE mg/dL
Ketones, POC UA: NEGATIVE mg/dL
LEUKOCYTES UA: NEGATIVE
Nitrite, UA: NEGATIVE
PROTEIN UA: NEGATIVE mg/dL
Spec Grav, UA: <=1.005 — AB (ref 1.010–1.025)
UROBILINOGEN UA: 0.2 U/dL
pH, UA: 6.5 (ref 5.0–8.0)

## 2017-11-10 MED ORDER — BUPROPION HCL ER (XL) 150 MG PO TB24: 150.0000 mg | ORAL_TABLET | Freq: Every day | ORAL | 4 refills | Status: DC

## 2017-11-10 MED ORDER — TRAZODONE HCL 50 MG PO TABS: 50.0000 mg | ORAL_TABLET | Freq: Every day | ORAL | 3 refills | Status: DC

## 2017-11-10 MED ORDER — VALACYCLOVIR HCL 1 G PO TABS: 1000.0000 mg | ORAL_TABLET | Freq: Every day | ORAL | 3 refills | Status: DC

## 2017-11-10 MED ORDER — ZOSTER VAC RECOMB ADJUVANTED 50 MCG/0.5ML IM SUSR: 0.5000 mL | Freq: Once | INTRAMUSCULAR | 1 refills | Status: AC

## 2017-11-10 MED ORDER — PAROXETINE HCL 20 MG PO TABS: 20.0000 mg | ORAL_TABLET | Freq: Every day | ORAL | 4 refills | Status: DC

## 2017-11-10 NOTE — Patient Instructions (Addendum)
IF you received an x-ray today, you will receive an invoice from [facility]. Please contact [facility] at [phone] with questions or concerns regarding your invoice.   IF you received labwork today, you will receive an invoice from [facility]. Please contact [facility] at [phone] with questions or concerns regarding your invoice.   Our billing staff will not be able to assist you with questions regarding bills from these companies.  You will be contacted with the lab results as soon as they are available. The fastest way to get your results is to activate your My Chart account. Instructions are located on the last page of this paperwork. If you have not heard from [place] regarding the results in 2 weeks, please contact this office.     Calcium Intake Recommendations Calcium is a mineral that affects many functions in the body, including:  Blood clotting.  Blood vessel function.  Nerve impulse conduction.  Hormone secretion.  Muscle contraction.  Bone and teeth functions.  Most of your body's calcium supply is stored in your bones and teeth. When your calcium stores are low, you may be at risk for low bone mass, bone loss, and bone fractures. Consuming enough calcium helps to grow healthy bones and teeth and to prevent breakdown over time. It is very important that you get enough calcium if you are:  A child undergoing rapid growth.  An adolescent girl.  A pre- or post-menopausal woman.  A woman whose menstrual cycle has stopped due to anorexia nervosa or regular intense exercise.  An individual with lactose intolerance or a milk allergy.  A vegetarian.  What is my plan? Try to consume the recommended amount of calcium daily based on your age. Depending on your overall health, your health care provider may recommend increased calcium intake.General daily calcium intake recommendations by age are:  Birth to 6 months: 200 mg.  Infants 7 to 12  months: 260 mg.  Children 1 to 3 years: 700 mg.  Children 4 to 8 years: 1,000 mg.  Children 9 to 13 years: 1,300 mg.  Teens 14 to 18 years: 1,300 mg.  Adults 19 to 50 years: 1,000 mg.  Adult women 51 to 70 years: 1,200 mg.   Adult men 51 to 70 years: 1,000 mg.  Adults 71 years and older: 1,200 mg.  Pregnant and breastfeeding teens: 1,300 mg.  Pregnant and breastfeeding adults: 1,000 mg.  What do I need to know about calcium intake?  In order for the body to absorb calcium, it needs vitamin D. You can get vitamin D through: ? Direct exposure of the skin to sunlight. ? Foods, such as egg yolks, liver, saltwater fish, and fortified milk. ? Supplements.  Consuming too much calcium may cause: ? Constipation. ? Decreased absorption of iron and zinc. ? Kidney stones.  Calcium supplements may interact with certain medicines. Check with your health care provider before starting any calcium supplements.  Try to get most of your calcium from food. What foods can I eat? Grains  Fortified oatmeal. Fortified ready-to-eat cereals. Fortified frozen waffles. Vegetables Turnip greens. Broccoli. Fruits Fortified orange juice. Meats and Other Protein Sources Canned sardines with bones. Canned salmon with bones. Soy beans. Tofu. Baked beans. Almonds. [place] nuts. Sunflower seeds. Dairy Milk. Yogurt. Cheese. Cottage cheese. Beverages Fortified soy milk. Fortified rice milk. Sweets/Desserts Pudding. Ice Cream. Milkshakes. Blackstrap molasses. The items listed above may not be a complete list of recommended foods or beverages. Contact your dietitian for more  options. What foods can affect my calcium intake? It may be more difficult for your body to use calcium or calcium may leave your body more quickly if you consume large amounts of:  Sodium.  Protein.  Caffeine.  Alcohol.  This information is not intended to replace advice given to you by your health care provider. Make  sure you discuss any questions you have with your health care provider. Document Released: 07/21/2004 Document Revised: 06/26/2016 Document Reviewed: 05/15/2014 Elsevier Interactive Patient Education  2018 [place] protect organs, store calcium, and anchor muscles. Good health habits, such as eating nutritious foods and exercising regularly, are important for maintaining healthy bones. They can also help to prevent a condition that causes bones to lose density and become weak and brittle (osteoporosis). Why is bone mass important? Bone mass refers to the amount of bone tissue that you have. The higher your bone mass, the stronger your bones. An important step toward having healthy bones throughout life is to have strong and dense bones during childhood. A young adult who has a high bone mass is more likely to have a high bone mass later in life. Bone mass at its greatest it is called peak bone mass. A large decline in bone mass occurs in older adults. In women, it occurs about the time of menopause. During this time, it is important to practice good health habits, because if more bone is lost than what is replaced, the bones will become less healthy and more likely to break (fracture). If you find that you have a low bone mass, you may be able to prevent osteoporosis or further bone loss by changing your diet and lifestyle. How can I find out if my bone mass is low? Bone mass can be measured with an X-ray test that is called a bone mineral density (BMD) test. This test is recommended for all women who are age 33 or older. It may also be recommended for men who are age 87 or older, or for people who are more likely to develop osteoporosis due to:  Having bones that break easily.  Having a long-term disease that weakens bones, such as kidney disease or rheumatoid arthritis.  Having menopause earlier than normal.  Taking medicine that weakens bones, such as steroids, thyroid  hormones, or hormone treatment for breast cancer or prostate cancer.  Smoking.  Drinking three or more alcoholic drinks each day.  What are the nutritional recommendations for healthy bones? To have healthy bones, you need to get enough of the right minerals and vitamins. Most nutrition experts recommend getting these nutrients from the foods that you eat. Nutritional recommendations vary from person to person. Ask your health care provider what is healthy for you. Here are some general guidelines. Calcium Recommendations Calcium is the most important (essential) mineral for bone health. Most people can get enough calcium from their diet, but supplements may be recommended for people who are at risk for osteoporosis. Good sources of calcium include:  Dairy products, such as low-fat or nonfat milk, cheese, and yogurt.  Dark green leafy vegetables, such as bok choy and broccoli.  Calcium-fortified foods, such as orange juice, cereal, bread, soy beverages, and tofu products.  Nuts, such as almonds.  Follow these recommended amounts for daily calcium intake:  Children, age 65?3: 700 mg.  Children, age 42?8: 1,000 mg.  Children, age 54?13: 1,300 mg.  Teens, age 8?18: 1,300 mg.  Adults, age 96?50: 1,000 mg.  Adults, age  51?70: ? Men: 1,000 mg. ? Women: 1,200 mg.  Adults, age 8 or older: 1,200 mg.  Pregnant and breastfeeding females: ? Teens: 1,300 mg. ? Adults: 1,000 mg.  Vitamin D Recommendations Vitamin D is the most essential vitamin for bone health. It helps the body to absorb calcium. Sunlight stimulates the skin to make vitamin D, so be sure to get enough sunlight. If you live in a cold climate or you do not get outside often, your health care provider may recommend that you take vitamin D supplements. Good sources of vitamin D in your diet include:  Egg yolks.  Saltwater fish.  Milk and cereal fortified with vitamin D.  Follow these recommended amounts for daily  vitamin D intake:  Children and teens, age 53?18: 36 international units.  Adults, age 38 or younger: 400-800 international units.  Adults, age 77 or older: 800-1,000 international units.  Other Nutrients Other nutrients for bone health include:  Phosphorus. This mineral is found in meat, poultry, dairy foods, nuts, and legumes. The recommended daily intake for adult men and adult women is 700 mg.  Magnesium. This mineral is found in seeds, nuts, dark green vegetables, and legumes. The recommended daily intake for adult men is 400?420 mg. For adult women, it is 310?320 mg.  Vitamin K. This vitamin is found in green leafy vegetables. The recommended daily intake is 120 mg for adult men and 90 mg for adult women.  What type of physical activity is best for building and maintaining healthy bones? Weight-bearing and strength-building activities are important for building and maintaining peak bone mass. Weight-bearing activities cause muscles and bones to work against gravity. Strength-building activities increases muscle strength that supports bones. Weight-bearing and muscle-building activities include:  Walking and hiking.  Jogging and running.  Dancing.  Gym exercises.  Lifting weights.  Tennis and racquetball.  Climbing stairs.  Aerobics.  Adults should get at least 30 minutes of moderate physical activity on most days. Children should get at least 60 minutes of moderate physical activity on most days. Ask your health care provide what type of exercise is best for you. Where can I find more information? For more information, check out the following websites:  [place]: [employer]se  [employer] of Health: http://www.niams.AnonymousEar.fr.asp  This information is not intended to replace advice given to you by your health care provider. Make sure you discuss any questions you have with  your health care provider. Document Released: 02/27/2004 Document Revised: 06/26/2016 Document Reviewed: 12/12/2014 Elsevier Interactive Patient Education  2018 [place] Maintenance for Postmenopausal Women Menopause is a normal process in which your reproductive ability comes to an end. This process happens gradually over a span of months to years, usually between the ages of 79 and 28. Menopause is complete when you have missed 12 consecutive menstrual periods. It is important to talk with your health care provider about some of the most common conditions that affect postmenopausal women, such as heart disease, cancer, and bone loss (osteoporosis). Adopting a healthy lifestyle and getting preventive care can help to promote your health and wellness. Those actions can also lower your chances of developing some of these common conditions. What should I know about menopause? During menopause, you may experience a number of symptoms, such as:  Moderate-to-severe hot flashes.  Night sweats.  Decrease in sex drive.  Mood swings.  Headaches.  Tiredness.  Irritability.  Memory problems.  Insomnia.  Choosing to treat or not to treat menopausal changes  is an individual decision that you make with your health care provider. What should I know about hormone replacement therapy and supplements? Hormone therapy products are effective for treating symptoms that are associated with menopause, such as hot flashes and night sweats. Hormone replacement carries certain risks, especially as you become older. If you are thinking about using estrogen or estrogen with progestin treatments, discuss the benefits and risks with your health care provider. What should I know about heart disease and stroke? Heart disease, heart attack, and stroke become more likely as you age. This may be due, in part, to the hormonal changes that your body experiences during menopause. These can affect how your  body processes dietary fats, triglycerides, and cholesterol. Heart attack and stroke are both medical emergencies. There are many things that you can do to help prevent heart disease and stroke:  Have your blood pressure checked at least every 1-2 years. High blood pressure causes heart disease and increases the risk of stroke.  If you are 46-16 years old, ask your health care provider if you should take aspirin to prevent a heart attack or a stroke.  Do not use any tobacco products, including cigarettes, chewing tobacco, or electronic cigarettes. If you need help quitting, ask your health care provider.  It is important to eat a healthy diet and maintain a healthy weight. ? Be sure to include plenty of vegetables, fruits, low-fat dairy products, and lean protein. ? Avoid eating foods that are high in solid fats, added sugars, or salt (sodium).  Get regular exercise. This is one of the most important things that you can do for your health. ? Try to exercise for at least 150 minutes each week. The type of exercise that you do should increase your heart rate and make you sweat. This is known as moderate-intensity exercise. ? Try to do strengthening exercises at least twice each week. Do these in addition to the moderate-intensity exercise.  Know your numbers.Ask your health care provider to check your cholesterol and your blood glucose. Continue to have your blood tested as directed by your health care provider.  What should I know about cancer screening? There are several types of cancer. Take the following steps to reduce your risk and to catch any cancer development as early as possible. Breast Cancer  Practice breast self-awareness. ? This means understanding how your breasts normally appear and feel. ? It also means doing regular breast self-exams. Let your health care provider know about any changes, no matter how small.  If you are 1 or older, have a clinician do a breast exam  (clinical breast exam or CBE) every year. Depending on your age, family history, and medical history, it may be recommended that you also have a yearly breast X-ray (mammogram).  If you have a family history of breast cancer, talk with your health care provider about genetic screening.  If you are at high risk for breast cancer, talk with your health care provider about having an MRI and a mammogram every year.  Breast cancer (BRCA) gene test is recommended for women who have family members with BRCA-related cancers. Results of the assessment will determine the need for genetic counseling and BRCA1 and for BRCA2 testing. BRCA-related cancers include these types: ? Breast. This occurs in males or females. ? Ovarian. ? Tubal. This may also be called fallopian tube cancer. ? Cancer of the abdominal or pelvic lining (peritoneal cancer). ? Prostate. ? Pancreatic.  Cervical, Uterine, and Ovarian Cancer  Your health care provider may recommend that you be screened regularly for cancer of the pelvic organs. These include your ovaries, uterus, and vagina. This screening involves a pelvic exam, which includes checking for microscopic changes to the surface of your cervix (Pap test).  For women ages 21-65, health care providers may recommend a pelvic exam and a Pap test every three years. For women ages 59-65, they may recommend the Pap test and pelvic exam, combined with testing for human papilloma virus (HPV), every five years. Some types of HPV increase your risk of cervical cancer. Testing for HPV may also be done on women of any age who have unclear Pap test results.  Other health care providers may not recommend any screening for nonpregnant women who are considered low risk for pelvic cancer and have no symptoms. Ask your health care provider if a screening pelvic exam is right for you.  If you have had past treatment for cervical cancer or a condition that could lead to cancer, you need Pap tests  and screening for cancer for at least 20 years after your treatment. If Pap tests have been discontinued for you, your risk factors (such as having a new sexual partner) need to be reassessed to determine if you should start having screenings again. Some women have medical problems that increase the chance of getting cervical cancer. In these cases, your health care provider may recommend that you have screening and Pap tests more often.  If you have a family history of uterine cancer or ovarian cancer, talk with your health care provider about genetic screening.  If you have vaginal bleeding after reaching menopause, tell your health care provider.  There are currently no reliable tests available to screen for ovarian cancer.  Lung Cancer Lung cancer screening is recommended for adults 51-35 years old who are at high risk for lung cancer because of a history of smoking. A yearly low-dose CT scan of the lungs is recommended if you:  Currently smoke.  Have a history of at least 30 pack-years of smoking and you currently smoke or have quit within the past 15 years. A pack-year is smoking an average of one pack of cigarettes per day for one year.  Yearly screening should:  Continue until it has been 15 years since you quit.  Stop if you develop a health problem that would prevent you from having lung cancer treatment.  Colorectal Cancer  This type of cancer can be detected and can often be prevented.  Routine colorectal cancer screening usually begins at age 42 and continues through age 35.  If you have risk factors for colon cancer, your health care provider may recommend that you be screened at an earlier age.  If you have a family history of colorectal cancer, talk with your health care provider about genetic screening.  Your health care provider may also recommend using home test kits to check for hidden blood in your stool.  A small camera at the end of a tube can be used to  examine your colon directly (sigmoidoscopy or colonoscopy). This is done to check for the earliest forms of colorectal cancer.  Direct examination of the colon should be repeated every 5-10 years until age 2. However, if early forms of precancerous polyps or small growths are found or if you have a family history or genetic risk for colorectal cancer, you may need to be screened more often.  Skin Cancer  Check your skin from head to toe  regularly.  Monitor any moles. Be sure to tell your health care provider: ? About any new moles or changes in moles, especially if there is a change in a mole's shape or color. ? If you have a mole that is larger than the size of a pencil eraser.  If any of your family members has a history of skin cancer, especially at a young age, talk with your health care provider about genetic screening.  Always use sunscreen. Apply sunscreen liberally and repeatedly throughout the day.  Whenever you are outside, protect yourself by wearing long sleeves, pants, a wide-brimmed hat, and sunglasses.  What should I know about osteoporosis? Osteoporosis is a condition in which bone destruction happens more quickly than new bone creation. After menopause, you may be at an increased risk for osteoporosis. To help prevent osteoporosis or the bone fractures that can happen because of osteoporosis, the following is recommended:  If you are 81-6 years old, get at least 1,000 mg of calcium and at least 600 mg of vitamin D per day.  If you are older than age 69 but younger than age 53, get at least 1,200 mg of calcium and at least 600 mg of vitamin D per day.  If you are older than age 3, get at least 1,200 mg of calcium and at least 800 mg of vitamin D per day.  Smoking and excessive alcohol intake increase the risk of osteoporosis. Eat foods that are rich in calcium and vitamin D, and do weight-bearing exercises several times each week as directed by your health care  provider. What should I know about how menopause affects my mental health? Depression may occur at any age, but it is more common as you become older. Common symptoms of depression include:  Low or sad mood.  Changes in sleep patterns.  Changes in appetite or eating patterns.  Feeling an overall lack of motivation or enjoyment of activities that you previously enjoyed.  Frequent crying spells.  Talk with your health care provider if you think that you are experiencing depression. What should I know about immunizations? It is important that you get and maintain your immunizations. These include:  Tetanus, diphtheria, and pertussis (Tdap) booster vaccine.  Influenza every year before the flu season begins.  Pneumonia vaccine.  Shingles vaccine.  Your health care provider may also recommend other immunizations. This information is not intended to replace advice given to you by your health care provider. Make sure you discuss any questions you have with your health care provider. Document Released: 01/29/2006 Document Revised: 06/26/2016 Document Reviewed: 09/10/2015 Elsevier Interactive Patient Education  2018 [employer].

## 2017-11-10 NOTE — Progress Notes (Addendum)
Subjective:  By signing my name below, I, [name], attest that this documentation has been prepared under the direction and in the presence of Delman Cheadle, MD Electronically Signed: Ladene Artist, ED Scribe 11/10/2017 at 9:38 AM.   Patient ID: Kristine Davidson, female    DOB: [birth year], 66 y.o.   MRN: [id number]  Chief Complaint  Patient presents with  . Hypertension    f/u on AWV w/ [facility]  . Hypothyroidism  . Nicotine Dependence  . Hyperlipidemia   HPI  AWV done by [facility] 1 wk ago but no labs drawn. Primary Preventative Screenings: Cervical Cancer: NA due to age. Pap done at 66 yo and was neg. No h/o abnormal pap. STI screening: neg Hep C 2014 Breast Cancer: mammogram 08/2017 nml at [place]: colonoscopy 04/2009 - repeat in 10 years - in [place] she thinks she had 1 polyp removed. She does get freq diverticulitis. Pt was referred to Dr. Benson Norway 2.5 yrs prior. Followed up but did not have a colonoscopy done. Reports flare up of genital herpes causes constipation and freq diverticulitis.  Skin Cancer: followed by [facility]  Tobacco use/EtOH/substances: referred for lung cancer screening last yr but pt never went. Currently smoking 10 cigarettes daily.  Bone Density: none prior Cardiac: EKG w/ Dr. Percival Spanish 02/2013 - POET scheduled but never done? Weight/Blood sugar/Diet/Exercise: not currently lifting weights BMI Readings from Last 3 Encounters:  11/10/17 18.85 kg/m  11/02/17 18.40 kg/m  10/31/16 18.22 kg/m   No results found for: HGBA1C OTC/Vit/Supp/Herbal: OTC sublingual Vitamin B-12 and 2,000 units Vitamin D, does not take any calcium supplements but eats [place] Yogurt often Dentist/Optho: sees dentist every 6 months, has not seen optho this year Immunizations:  Immunization History  Administered Date(s) Administered  . Influenza, Seasonal, Injecte, Preservative Fre 11/24/2012  . Influenza,inj,Quad PF,6+ Mos 12/04/2013, 11/01/2014,  09/30/2015, 10/29/2016, 11/02/2017  . PPD Test 04/06/2012  . Pneumococcal Conjugate-13 09/30/2015  . Pneumococcal Polysaccharide-23 10/23/2010, 10/29/2016  . Tdap 08/25/2013    Chronic Medical Conditions: 1. Hypothyroid: on levothyroxine 50 mcg. Pt has noticed cold intolerance, skin puffiness and fatigue  2. HTN - diet controlled 3. Vit D def: taking qd vit D supp 4000u - nml last yr 4.  B12 def: on oral b12 supp - nml last yr, mcv increasing since pass several years 5.  H/o anemia: unsure of why or when. 6. HLD: diet controlled - last yr LDL 95, non-HDL 107 7. Tobacco use: smoking 1/2 ppd on wellbutrin  8.  Mood d/o of chronic anxiety and depression: On wellbutrin 150 qd - 10 years-ishg, paxil 20 qd, and trazodone 50 qhs. Sees a therapist and gets med from psych.  [facility] where pt went after her sister died. Dr. Joseph Art had started pt on it but she isn't getting counseling anymore. 9. Recurrent HSV-2: prn valacyclovir but on suppressive as was having monthly outbreaks. Currently taking 1/2 tablet qhs.   Past Medical History:  Diagnosis Date  . Anemia   . Depression   . Diverticular disease   . HSV-2 (herpes simplex virus 2) infection   . Hyperlipidemia   . Hypertension   . Mitral valve prolapse   . Substance abuse ([place])   . Thyroid disease    Past Surgical History:  Procedure Laterality Date  . D and C    . TONSILLECTOMY    . VARICOSE VEIN SURGERY  2000   Current Outpatient Medications on File Prior to Visit  Medication Sig Dispense Refill  . cholecalciferol (VITAMIN D) 1000 UNITS tablet Take 2,000 Units by mouth daily.     . vitamin B-12 (CYANOCOBALAMIN) 250 MCG tablet Take 250 mcg by mouth daily.     No current facility-administered medications on file prior to visit.    Allergies  Allergen Reactions  . Codeine Itching   Family History  Problem Relation Age of Onset  . Cancer Mother   . Stroke Mother   . Macular  degeneration Mother   . Parkinson's disease Father   . Parkinson's disease Sister   . Diabetes Sister    Social History   Socioeconomic History  . Marital status: Divorced    Spouse name: None  . Number of children: 0  . Years of education: None  . Highest education level: Professional school degree (e.g., MD, DDS, DVM, JD)  Social Needs  . Financial resource strain: Not very hard  . Food insecurity - worry: Never true  . Food insecurity - inability: Never true  . Transportation needs - medical: No  . Transportation needs - non-medical: No  Occupational History  . 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 Smoking status: Current Every Day Smoker    Packs/day: 0.50    Years: 43.00    Pack years: 21.50    Types: Cigarettes  . Smokeless tobacco: Never Used  Substance and Sexual Activity  . Alcohol use: No    Alcohol/week: 0.0 oz  . Drug use: No  . Sexual activity: No  Other Topics Concern  . None  Social History Narrative   Lives alone.    Depression screen [facility] 2/9 11/10/2017 11/02/2017 10/11/2017 10/31/2016 10/29/2016  Decreased Interest 0 0 0 0 0  Down, Depressed, Hopeless 0 0 0 0 0  PHQ - 2 Score 0 0 0 0 0    Review of Systems  Constitutional: Positive for fatigue.  Endocrine: Positive for cold intolerance.  All other systems reviewed and are negative.     Objective:   Physical Exam  Constitutional: She is oriented to person, place, and time. She appears well-developed and well-nourished. No distress.  HENT:  Head: Normocephalic and atraumatic.  Right Ear: Tympanic membrane normal.  Left Ear: Tympanic membrane normal.  Nose: Nose normal.  Mouth/Throat: Oropharynx is clear and moist and mucous membranes are normal.  Eyes: Conjunctivae and EOM are normal.  Neck: Neck supple. No tracheal deviation present.  Cardiovascular: Normal rate.  Pulmonary/Chest: Effort normal. No respiratory distress.    Abdominal: Bowel sounds are normal.  Musculoskeletal: Normal range of motion.  Neurological: She is alert and oriented to person, place, and time.  Skin: Skin is warm and dry.  Psychiatric: She has a normal mood and affect. Her behavior is normal.  Nursing note and vitals reviewed.  BP 100/68   Pulse 77   Temp 98.6 F (37 C)   Resp 16   Ht 5\' 6"  (1.676 m)   Wt 116 lb 12.8 oz (53 kg)   SpO2 97%   BMI 18.85 kg/m     Normal sinus rhythm. No acute ischemic changes. No significant change when compared to prior EKG done 03/10/13 with the exception of large S waves in lateral chest leads. I personally reviewed EKG tracing and agree with computer interpretation.   Assessment & Plan:  Refer  Again for lung cancer screening - didn't go last year. Needs dexa scan Tsh, cmp, lipid, cmp,  normal ua 1 month ago Shingrix  2. Essential hypertension   3. Hyperlipidemia, unspecified hyperlipidemia type   4. Tobacco use disorder   5. Hypothyroidism, unspecified type - levothyroxine refilled - same dose as tsh wnml range towards lower end.  6. HSV-2 (herpes simplex virus 2) infection   7. B12 deficiency   8. Depression with anxiety   9. Anemia, unspecified type   10. Primary insomnia   11. Encounter for screening colonoscopy   12. Screening for colon cancer   13. Postmenopausal estrogen deficiency     Orders Placed This Encounter  Procedures  . DG Bone Density    Standing Status:   Future    Standing Expiration Date:   01/10/2019    Order Specific Question:   Reason for Exam (SYMPTOM  OR DIAGNOSIS REQUIRED)    Answer:   postmenopausal estrogen deficiency    Order Specific Question:   Preferred imaging location?    Answer:   [facility]  . Lipid panel    Order Specific Question:   Has the patient fasted?    Answer:   Yes  . TSH  . CBC with Differential/Platelet  . Comprehensive metabolic panel    Order Specific Question:   Has the patient fasted?    Answer:   Yes  . Vitamin B12   . Ferritin  . Ambulatory referral to Gastroenterology    Referral Priority:   Routine    Referral Type:   Consultation    Referral Reason:   Specialty Services Required    Number of Visits Requested:   1  . Ambulatory Referral for Lung Cancer Scre    Referral Priority:   Routine    Referral Type:   Consultation    Referral Reason:   Specialty Services Required    Number of Visits Requested:   1  . POCT urinalysis dipstick  . EKG 12-Lead    Meds ordered this encounter  Medications  . traZODone (DESYREL) 50 MG tablet    Sig: Take 1-2 tablets (50-100 mg total) by mouth at bedtime.    Dispense:  180 tablet    Refill:  3  . valACYclovir (VALTREX) 1000 MG tablet    Sig: Take 1 tablet (1,000 mg total) by mouth daily.    Dispense:  90 tablet    Refill:  3  . PARoxetine (PAXIL) 20 MG tablet    Sig: Take 1 tablet (20 mg total) by mouth at bedtime.    Dispense:  90 tablet    Refill:  4  . buPROPion (WELLBUTRIN XL) 150 MG 24 hr tablet    Sig: Take 1 tablet (150 mg total) by mouth daily.    Dispense:  90 tablet    Refill:  4  . Zoster Vaccine Adjuvanted [facility]) injection    Sig: Inject 0.5 mLs into the muscle once for 1 dose. Repeat once in 2-6 months    Dispense:  0.5 mL    Refill:  1   Over 40 min spent in face-to-face evaluation of and consultation with patient and coordination of care.  Over 50% of this time was spent counseling this patient regarding preventative health care, immunizations, screening for cancer and other common diseases, smoking cessation, bone health.  I personally performed the services described in this documentation, which was scribed in my presence. The recorded information has been reviewed and considered, and addended by me as needed.   Delman Cheadle, M.D.  Primary Care at [facility] [address]  [place], [place] [address] [phone] phone [phone] fax  11/13/17 1:10 AM

## 2017-11-11 LAB — COMPREHENSIVE METABOLIC PANEL
ALT: 9 IU/L (ref 0–32)
AST: 11 IU/L (ref 0–40)
Albumin/Globulin Ratio: 2.1 (ref 1.2–2.2)
Albumin: 4.2 g/dL (ref 3.6–4.8)
Alkaline Phosphatase: 72 IU/L (ref 39–117)
BUN/Creatinine Ratio: 18 (ref 12–28)
BUN: 13 mg/dL (ref 8–27)
Bilirubin Total: 0.4 mg/dL (ref 0.0–1.2)
CALCIUM: 9.2 mg/dL (ref 8.7–10.3)
CO2: 24 mmol/L (ref 20–29)
CREATININE: 0.73 mg/dL (ref 0.57–1.00)
Chloride: 101 mmol/L (ref 96–106)
GFR calc Af Amer: 98 mL/min/{1.73_m2} (ref >59)
GFR, EST NON AFRICAN AMERICAN: 85 mL/min/{1.73_m2} (ref >59)
Globulin, Total: 2 g/dL (ref 1.5–4.5)
Glucose: 92 mg/dL (ref 65–99)
POTASSIUM: 4.3 mmol/L (ref 3.5–5.2)
Sodium: 138 mmol/L (ref 134–144)
Total Protein: 6.2 g/dL (ref 6.0–8.5)

## 2017-11-11 LAB — CBC WITH DIFFERENTIAL/PLATELET
Basophils Absolute: 0 10*3/uL (ref 0.0–0.2)
Basos: 0 %
EOS (ABSOLUTE): 0 10*3/uL (ref 0.0–0.4)
EOS: 0 %
HEMATOCRIT: 35.1 % (ref 34.0–46.6)
HEMOGLOBIN: 12.4 g/dL (ref 11.1–15.9)
IMMATURE GRANULOCYTES: 0 %
Immature Grans (Abs): 0 10*3/uL (ref 0.0–0.1)
Lymphocytes Absolute: 1.2 10*3/uL (ref 0.7–3.1)
Lymphs: 36 %
MCH: 33.1 pg — ABNORMAL HIGH (ref 26.6–33.0)
MCHC: 35.3 g/dL (ref 31.5–35.7)
MCV: 94 fL (ref 79–97)
MONOCYTES: 9 %
Monocytes Absolute: 0.3 10*3/uL (ref 0.1–0.9)
NEUTROS PCT: 55 %
Neutrophils Absolute: 1.8 10*3/uL (ref 1.4–7.0)
Platelets: 195 10*3/uL (ref 150–379)
RBC: 3.75 x10E6/uL — AB (ref 3.77–5.28)
RDW: 13.7 % (ref 12.3–15.4)
WBC: 3.3 10*3/uL — AB (ref 3.4–10.8)

## 2017-11-11 LAB — LIPID PANEL
CHOLESTEROL TOTAL: 177 mg/dL (ref 100–199)
Chol/HDL Ratio: 2.2 ratio (ref 0.0–4.4)
HDL: 79 mg/dL (ref >39)
LDL CALC: 77 mg/dL (ref 0–99)
TRIGLYCERIDES: 106 mg/dL (ref 0–149)
VLDL Cholesterol Cal: 21 mg/dL (ref 5–40)

## 2017-11-11 LAB — FERRITIN: Ferritin: 68 ng/mL (ref 15–150)

## 2017-11-11 LAB — VITAMIN B12: VITAMIN B 12: 866 pg/mL (ref 232–1245)

## 2017-11-11 LAB — TSH: TSH: 1.68 u[IU]/mL (ref 0.450–4.500)

## 2017-11-13 MED ORDER — LEVOTHYROXINE SODIUM 50 MCG PO TABS: ORAL_TABLET | ORAL | 3 refills | Status: DC

## 2017-11-15 ENCOUNTER — Other Ambulatory Visit: Payer: Self-pay | Admitting: Acute Care

## 2017-11-15 DIAGNOSIS — Z122 Encounter for screening for malignant neoplasm of respiratory organs: Secondary | ICD-10-CM

## 2017-11-15 DIAGNOSIS — F17210 Nicotine dependence, cigarettes, uncomplicated: Secondary | ICD-10-CM

## 2017-12-01 ENCOUNTER — Encounter: Payer: Medicare Other | Admitting: Acute Care

## 2017-12-01 ENCOUNTER — Other Ambulatory Visit: Payer: Self-pay | Admitting: Family Medicine

## 2017-12-01 ENCOUNTER — Inpatient Hospital Stay: Admission: RE | Admit: 2017-12-01 | Payer: Medicare Other | Source: Ambulatory Visit

## 2017-12-01 MED ORDER — LEVOTHYROXINE SODIUM 50 MCG PO TABS: ORAL_TABLET | ORAL | 3 refills | Status: DC

## 2017-12-01 NOTE — Telephone Encounter (Signed)
Copied from [place]. Topic: General - Other >> Dec 01, 2017 11:41 AM Darl Householder, RMA wrote: Reason for CRM: Medication refill request for Levothyroxine 28mcg to be sent to [facility]

## 2017-12-10 ENCOUNTER — Encounter: Payer: Self-pay | Admitting: Family Medicine

## 2017-12-27 ENCOUNTER — Other Ambulatory Visit: Payer: Medicare Other

## 2018-01-11 NOTE — Addendum Note (Signed)
Addended by: Shawnee Knapp on: 01/11/2018 01:31 PM   Modules accepted: Level of Service

## 2018-01-14 ENCOUNTER — Ambulatory Visit
Admission: RE | Admit: 2018-01-14 | Discharge: 2018-01-14 | Disposition: A | Discharge status: Home / Self Care | Payer: Medicare Other | Source: Ambulatory Visit | Attending: Family Medicine | Admitting: Family Medicine

## 2018-01-14 DIAGNOSIS — M810 Age-related osteoporosis without current pathological fracture: Secondary | ICD-10-CM

## 2018-01-14 DIAGNOSIS — Z780 Asymptomatic menopausal state: Secondary | ICD-10-CM

## 2018-01-14 HISTORY — DX: Age-related osteoporosis without current pathological fracture: M81.0

## 2018-03-30 ENCOUNTER — Encounter: Payer: Self-pay | Admitting: Physician Assistant

## 2018-05-08 DIAGNOSIS — S60414A Abrasion of right ring finger, initial encounter: Secondary | ICD-10-CM | POA: Diagnosis not present

## 2018-06-16 DIAGNOSIS — R51 Headache: Secondary | ICD-10-CM | POA: Diagnosis not present

## 2018-06-16 DIAGNOSIS — H6121 Impacted cerumen, right ear: Secondary | ICD-10-CM | POA: Diagnosis not present

## 2018-10-04 DIAGNOSIS — Z23 Encounter for immunization: Secondary | ICD-10-CM | POA: Diagnosis not present

## 2018-10-24 ENCOUNTER — Encounter: Payer: Self-pay | Admitting: Family Medicine

## 2018-10-24 NOTE — Progress Notes (Signed)
Subjective:    Patient: Kristine Davidson  DOB: 1951/06/18; 67 y.o.   MRN: [id number]  Chief Complaint  Patient presents with  . Medication Refill    wellbutrin, synthroid, paxil, desyrel, valtrex    HPI Osteoporosis dx with last DEXA scan. Denies any trouble swallowing or GERD sxs. Sees dental q6 mos.  Exercise is walking dogs.  Taking centrum silver for seniors. Does eat greek yogurt every mornings. Some cheese. No much meat other than chicken salmon.  CBGs 70s-85 in the mornings.   Tired, sad, or depressed. Is an introvent by nature - does get out and keep in touch. Be morning about aging and friends passing.   Sleeping well on trazodone. Not as much seasonal as sxs were still this summer.Gets electrical zaps when she has tried to come off the paxil - whenever she go off of it before.   Takes 1/2 valacyclovir qhs. When she ran out once, she had a herpes outbreak several years later.   Colonoscopy will be due next yr - last one was done not in [place] prior to moving here.   Had 3D mammogram last December once year.  Dr Claiborne Billings [facility] on [address]  Right ear infection and left swollen gland.  She has used an abx drop with great success prev.  Medical History Past Medical History:  Diagnosis Date  . Anemia   . Depression   . Diverticular disease   . HSV-2 (herpes simplex virus 2) infection   . Hyperlipidemia   . Hypertension   . Mitral valve prolapse   . Osteoporosis 01/14/2018   DEXA scan at [facility] 01/14/18 T score -2.8 at Rt femur neck and L1-L4 AP spine  . Substance abuse ([place])   . Thyroid disease    Past Surgical History:  Procedure Laterality Date  . D and C    . TONSILLECTOMY    . VARICOSE VEIN SURGERY  2000   Current Outpatient Medications on File Prior to Visit  Medication Sig Dispense Refill  . buPROPion (WELLBUTRIN XL) 150 MG 24 hr tablet Take 1 tablet (150 mg total) by mouth daily. 90 tablet 4  . cholecalciferol (VITAMIN D) 1000  UNITS tablet Take 2,000 Units by mouth daily.     Marland Kitchen levothyroxine (SYNTHROID, LEVOTHROID) 50 MCG tablet TAKE 1 TABLET (50 MCG TOTAL) BY MOUTH DAILY. 90 tablet 3  . PARoxetine (PAXIL) 20 MG tablet Take 1 tablet (20 mg total) by mouth at bedtime. 90 tablet 4  . traZODone (DESYREL) 50 MG tablet Take 1-2 tablets (50-100 mg total) by mouth at bedtime. 180 tablet 3  . valACYclovir (VALTREX) 1000 MG tablet Take 1 tablet (1,000 mg total) by mouth daily. 90 tablet 3  . vitamin B-12 (CYANOCOBALAMIN) 250 MCG tablet Take 250 mcg by mouth daily.     No current facility-administered medications on file prior to visit.    Allergies  Allergen Reactions  . Codeine Itching   Family History  Problem Relation Age of Onset  . Cancer Mother   . Stroke Mother   . Macular degeneration Mother   . Parkinson's disease Father   . Parkinson's disease Sister   . Diabetes Sister    Social History   Socioeconomic History  . Marital status: Divorced    Spouse name: Not on file  . Number of children: 0  . Years of education: Not on file  . Highest education level: Professional school degree (e.g., MD, DDS, DVM, JD)  Occupational History  .  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 Financial resource strain: Not very hard  . Food insecurity:    Worry: Never true    Inability: Never true  . Transportation needs:    Medical: No    Non-medical: No  Tobacco Use  . Smoking status: Current Every Day Smoker    Packs/day: 0.50    Years: 43.00    Pack years: 21.50    Types: Cigarettes  . Smokeless tobacco: Never Used  Substance and Sexual Activity  . Alcohol use: No    Alcohol/week: 0.0 standard drinks  . Drug use: No  . Sexual activity: Never  Lifestyle  . Physical activity:    Days per week: 0 days    Minutes per session: 0 min  . Stress: Not at all  Relationships  . Social connections:    Talks on phone: More than three times a week      Gets together: More than three times a week    Attends religious service: Never    Active member of club or organization: Yes    Attends meetings of clubs or organizations: More than 4 times per year    Relationship status: Divorced  Other Topics Concern  . Not on file  Social History Narrative   Lives alone.    Depression screen [facility] 2/9 11/10/2017 11/02/2017 10/11/2017 10/31/2016 10/29/2016  Decreased Interest 0 0 0 0 0  Down, Depressed, Hopeless 0 0 0 0 0  PHQ - 2 Score 0 0 0 0 0    ROS As noted in HPI  Objective:  There were no vitals taken for this visit. Physical Exam  Constitutional: She is oriented to person, place, and time. She appears well-developed and well-nourished. No distress.  HENT:  Head: Normocephalic and atraumatic.  Right Ear: External ear normal.  Left Ear: External ear normal.  Eyes: Conjunctivae are normal. No scleral icterus.  Neck: Normal range of motion. Neck supple. No thyromegaly present.  Cardiovascular: Normal rate, regular rhythm, normal heart sounds and intact distal pulses.  Pulmonary/Chest: Effort normal and breath sounds normal. No respiratory distress.  Musculoskeletal: She exhibits no edema.  Lymphadenopathy:    She has no cervical adenopathy.  Neurological: She is alert and oriented to person, place, and time.  Skin: Skin is warm and dry. She is not diaphoretic. No erythema.  Psychiatric: She has a normal mood and affect. Her behavior is normal.    [facility] TESTING Office Visit on 10/29/2018  Component Date Value Ref Range Status  . Vit D, 25-Hydroxy 10/29/2018 31.1  30.0 - 100.0 ng/mL Final   Comment: Vitamin D deficiency has been defined by the [place] practice guideline as a level of serum 25-OH vitamin D less than 20 ng/mL (1,2). The Endocrine Society went on to further define vitamin D insufficiency as a level between 21 and 29 ng/mL (2). 1. IOM (Institute of Medicine). 2010. Dietary  reference    intakes for calcium and D. [place]: The    [employer]. 2. Holick MF, Binkley [place], Bischoff-Ferrari HA, et al.    Evaluation, treatment, and prevention of vitamin D    deficiency: an Endocrine Society clinical practice    guideline. JCEM. 2011 Jul; 96(7):1911-30.   Marland Kitchen TSH 10/29/2018 1.450  0.450 - 4.500 uIU/mL Final  . WBC 10/29/2018 4.0  3.4 - 10.8 x10E3/uL Final  . RBC 10/29/2018 3.78  3.77 - 5.28 x10E6/uL Final  . Hemoglobin 10/29/2018 12.9  11.1 - 15.9 g/dL Final  . Hematocrit 10/29/2018 36.2  34.0 - 46.6 % Final  . MCV 10/29/2018 96  79 - 97 fL Final  . MCH 10/29/2018 34.1* 26.6 - 33.0 pg Final  . MCHC 10/29/2018 35.6  31.5 - 35.7 g/dL Final  . RDW 10/29/2018 12.0* 12.3 - 15.4 % Final  . Platelets 10/29/2018 219  150 - 450 x10E3/uL Final  . Neutrophils 10/29/2018 48  Not Estab. % Final  . Lymphs 10/29/2018 40  Not Estab. % Final  . Monocytes 10/29/2018 10  Not Estab. % Final  . Eos 10/29/2018 1  Not Estab. % Final  . Basos 10/29/2018 1  Not Estab. % Final  . Neutrophils Absolute 10/29/2018 2.0  1.4 - 7.0 x10E3/uL Final  . Lymphocytes Absolute 10/29/2018 1.6  0.7 - 3.1 x10E3/uL Final  . Monocytes Absolute 10/29/2018 0.4  0.1 - 0.9 x10E3/uL Final  . EOS (ABSOLUTE) 10/29/2018 0.0  0.0 - 0.4 x10E3/uL Final  . Basophils Absolute 10/29/2018 0.0  0.0 - 0.2 x10E3/uL Final  . Immature Granulocytes 10/29/2018 0  Not Estab. % Final  . Immature Grans (Abs) 10/29/2018 0.0  0.0 - 0.1 x10E3/uL Final  . Glucose 10/29/2018 96  65 - 99 mg/dL Final  . BUN 10/29/2018 14  8 - 27 mg/dL Final  . Creatinine, Ser 10/29/2018 0.67  0.57 - 1.00 mg/dL Final  . GFR calc non Af Amer 10/29/2018 90  >59 mL/min/1.73 Final  . GFR calc Af Amer 10/29/2018 104  >59 mL/min/1.73 Final  . BUN/Creatinine Ratio 10/29/2018 21  12 - 28 Final  . Sodium 10/29/2018 139  134 - 144 mmol/L Final  . Potassium 10/29/2018 4.4  3.5 - 5.2 mmol/L Final  . Chloride 10/29/2018 101  96 - 106 mmol/L  Final  . CO2 10/29/2018 23  20 - 29 mmol/L Final  . Calcium 10/29/2018 9.2  8.7 - 10.3 mg/dL Final  . Total Protein 10/29/2018 6.4  6.0 - 8.5 g/dL Final  . Albumin 10/29/2018 4.5  3.6 - 4.8 g/dL Final  . Globulin, Total 10/29/2018 1.9  1.5 - 4.5 g/dL Final  . Albumin/Globulin Ratio 10/29/2018 2.4* 1.2 - 2.2 Final  . Bilirubin Total 10/29/2018 0.2  0.0 - 1.2 mg/dL Final  . Alkaline Phosphatase 10/29/2018 78  39 - 117 IU/L Final  . AST 10/29/2018 19  0 - 40 IU/L Final  . ALT 10/29/2018 16  0 - 32 IU/L Final  . Vitamin B-12 10/29/2018 561  232 - 1,245 pg/mL Final     Assessment & Plan:   1. Age-related osteoporosis without current pathological fracture   2. Hypothyroidism, unspecified type   3. Vitamin D deficiency disease   4. Neutropenia, unspecified type ([place])   5. Anemia, unspecified type   6. Medication monitoring encounter   7. Vitamin B12 deficiency   8. Primary insomnia   9. Nevus   10. Encounter for screening mammogram for breast cancer   11. Screening for colorectal cancer     Refer to  GI Dr. Loletha Carrow for colonoscopy next May 2020.  Cont to do annual mammograms due to +FHx breast cancer in mother. Patient will continue on current chronic medications other than changes noted above, so ok to refill when needed.   See after visit summary for patient specific instructions.  Orders Placed This Encounter  Procedures  . MM 3D SCREEN BREAST BILATERAL    MCR/ MUTUAL OF [place]/PF:  09/15/17@BCG /NO PROBLEMS, PERSONAL HX OR IMPLANTS/NO NEEDS/TA/PT    Standing Status:   Future    Standing Expiration Date:   12/30/2019    Order Specific Question:   Reason for Exam (SYMPTOM  OR DIAGNOSIS REQUIRED)    Answer:   ANNUAL    Order Specific Question:   Preferred imaging location?    Answer:   [facility]  . VITAMIN D 25 Hydroxy (Vit-D Deficiency, Fractures)  . TSH  . CBC with Differential/Platelet  . Comprehensive metabolic panel  . Vitamin B12  . Ambulatory referral to  Dermatology    Referral Priority:   Routine    Referral Type:   Consultation    Referral Reason:   Specialty Services Required    Requested Specialty:   Dermatology    Number of Visits Requested:   1  . Ambulatory referral to Gastroenterology    Referral Priority:   Routine    Referral Type:   Consultation    Referral Reason:   Specialty Services Required    Referred to Provider:   Doran Stabler, MD    Number of Visits Requested:   1    Meds ordered this encounter  Medications  . PARoxetine (PAXIL) 30 MG tablet    Sig: Take 1 tablet (30 mg total) by mouth at bedtime.    Dispense:  90 tablet    Refill:  4  . neomycin-polymyxin-hydrocortisone (CORTISPORIN) OTIC solution    Sig: Place 4 drops into the right ear 4 (four) times daily.    Dispense:  10 mL    Refill:  0  . triamcinolone (NASACORT) 55 MCG/ACT AERO nasal inhaler    Sig: Place 2 sprays into the nose daily.    Dispense:  1 Inhaler    Refill:  2  . buPROPion (WELLBUTRIN XL) 150 MG 24 hr tablet    Sig: Take 1 tablet (150 mg total) by mouth daily.    Dispense:  90 tablet    Refill:  4  . levothyroxine (SYNTHROID, LEVOTHROID) 50 MCG tablet    Sig: TAKE 1 TABLET (50 MCG TOTAL) BY MOUTH DAILY.    Dispense:  90 tablet    Refill:  3  . traZODone (DESYREL) 50 MG tablet    Sig: Take 1-2 tablets (50-100 mg total) by mouth at bedtime.    Dispense:  180 tablet    Refill:  3  . valACYclovir (VALTREX) 1000 MG tablet    Sig: Take 1 tablet (1,000 mg total) by mouth daily.    Dispense:  90 tablet    Refill:  3    Patient verbalized to me that they understand the following: diagnosis, what is being done for them, what to expect and what should be done at home.  Their questions have been answered. They understand that I am unable to predict every possible medication interaction or adverse outcome and that if any unexpected symptoms arise, they should contact us and their pharmacist, as well as never hesitate to seek  urgent/emergent care at [facility] or ER if they think it might be warranted.    Over 40 min spent in face-to-face evaluation of and consultation with patient and coordination of care.  Over 50% of this time was spent counseling this patient regarding all above, risks of shinglex on valtrex.  Delman Cheadle, MD, MPH Primary Care at [facility] [address] [place], [place]  [address] [phone] Office phone  [phone] Office fax  10/24/18 12:43 AM

## 2018-10-29 ENCOUNTER — Other Ambulatory Visit: Payer: Self-pay

## 2018-10-29 ENCOUNTER — Ambulatory Visit (INDEPENDENT_AMBULATORY_CARE_PROVIDER_SITE_OTHER): Payer: Medicare Other | Admitting: Family Medicine

## 2018-10-29 ENCOUNTER — Encounter: Payer: Self-pay | Admitting: Family Medicine

## 2018-10-29 VITALS — BP 108/75 | HR 74 | Temp 98.6°F | Ht 66.0 in | Wt 116.4 lb

## 2018-10-29 DIAGNOSIS — E039 Hypothyroidism, unspecified: Secondary | ICD-10-CM | POA: Diagnosis not present

## 2018-10-29 DIAGNOSIS — Z1211 Encounter for screening for malignant neoplasm of colon: Secondary | ICD-10-CM | POA: Diagnosis not present

## 2018-10-29 DIAGNOSIS — F5101 Primary insomnia: Secondary | ICD-10-CM

## 2018-10-29 DIAGNOSIS — Z1231 Encounter for screening mammogram for malignant neoplasm of breast: Secondary | ICD-10-CM

## 2018-10-29 DIAGNOSIS — E538 Deficiency of other specified B group vitamins: Secondary | ICD-10-CM | POA: Diagnosis not present

## 2018-10-29 DIAGNOSIS — D709 Neutropenia, unspecified: Secondary | ICD-10-CM | POA: Diagnosis not present

## 2018-10-29 DIAGNOSIS — M810 Age-related osteoporosis without current pathological fracture: Secondary | ICD-10-CM | POA: Diagnosis not present

## 2018-10-29 DIAGNOSIS — D649 Anemia, unspecified: Secondary | ICD-10-CM

## 2018-10-29 DIAGNOSIS — Z1212 Encounter for screening for malignant neoplasm of rectum: Secondary | ICD-10-CM | POA: Diagnosis not present

## 2018-10-29 DIAGNOSIS — E559 Vitamin D deficiency, unspecified: Secondary | ICD-10-CM

## 2018-10-29 DIAGNOSIS — Z5181 Encounter for therapeutic drug level monitoring: Secondary | ICD-10-CM | POA: Diagnosis not present

## 2018-10-29 DIAGNOSIS — D229 Melanocytic nevi, unspecified: Secondary | ICD-10-CM | POA: Diagnosis not present

## 2018-10-29 MED ORDER — TRAZODONE HCL 50 MG PO TABS: 50.0000 mg | ORAL_TABLET | Freq: Every day | ORAL | 3 refills | Status: DC

## 2018-10-29 MED ORDER — VALACYCLOVIR HCL 1 G PO TABS: 1000.0000 mg | ORAL_TABLET | Freq: Every day | ORAL | 3 refills | Status: DC

## 2018-10-29 MED ORDER — TRIAMCINOLONE ACETONIDE 55 MCG/ACT NA AERO: 2.0000 | INHALATION_SPRAY | Freq: Every day | NASAL | 2 refills | Status: DC

## 2018-10-29 MED ORDER — BUPROPION HCL ER (XL) 150 MG PO TB24: 150.0000 mg | ORAL_TABLET | Freq: Every day | ORAL | 4 refills | Status: DC

## 2018-10-29 MED ORDER — NEOMYCIN-POLYMYXIN-HC 3.5-10000-1 OT SOLN: 4.0000 [drp] | Freq: Four times a day (QID) | OTIC | 0 refills | Status: DC

## 2018-10-29 MED ORDER — PAROXETINE HCL 30 MG PO TABS: 30.0000 mg | ORAL_TABLET | Freq: Every day | ORAL | 4 refills | Status: DC

## 2018-10-29 MED ORDER — LEVOTHYROXINE SODIUM 50 MCG PO TABS: ORAL_TABLET | ORAL | 3 refills | Status: DC

## 2018-10-29 NOTE — Patient Instructions (Addendum)
Your DEXA bone scan shows that you have developed osteoporosis. Your bones have become thinned and weakened as your body has had to take calcium from them in effort to keep your blood calcium level normal. Therefore, it is recommended that you start on medication to hopefully keep this from worsening while also making sure you are orally taking in adequate daily amounts of calcium and vitamin D.    We often start treatment with a bisphosphonate medication like foxamax which is taken once a week, first thing in the morning on an empty stomach with water only. For the next 30 minutes you must remain upright (do not lay back down as we want to use gravity to ensure that the pill remains in the stomach). It is very difficult for your body to absorb this medicine so wait at least 30 minutes before eating, drinking, or taking any other medications/vitamins/supplements. It is recommended to have any needed dental work completed prior to starting this medication. We then repeat the bone scan in 2 years to assess progression.   While your body can take calcium out of your bones, it can no longer put calcium back into your bones which is why it is so important to make sure you that you are getting an adequate oral intake of 1200 mg of calcium on a daily basis through your diet and/or supplements. Calcium cannot be absorbed in large doses so do not take >500mg  at a time. You cannot absorb calcium well without adequate vitamin D so make sure to get at least 800 iu of vitamin D daily. Weight-bearing exercise for at least 30 minutes 3 days a week can also help.  The main dietary sources of calcium include milk and other dairy products, such as cottage cheese, yogurt, or hard cheese, and green vegetables, such as kale and broccoli. A rough method of estimating dietary calcium intake is to multiply the number of dairy servings consumed each day by 300 mg. One serving is 8 oz of milk (236 mL) or yogurt (224 g), 1 oz (28  g) of hard cheese, or 16 oz (448 g) of cottage cheese.     If you have lab work done today you will be contacted with your lab results within the next 2 weeks.  If you have not heard from [place] then please contact us. The fastest way to get your results is to register for My Chart.   IF you received an x-ray today, you will receive an invoice from [facility]. Please contact [facility] at [phone] with questions or concerns regarding your invoice.   IF you received labwork today, you will receive an invoice from [facility]. Please contact [facility] at [phone] with questions or concerns regarding your invoice.   Our billing staff will not be able to assist you with questions regarding bills from these companies.  You will be contacted with the lab results as soon as they are available. The fastest way to get your results is to activate your My Chart account. Instructions are located on the last page of this paperwork. If you have not heard from [place] regarding the results in 2 weeks, please contact this office.     Osteoporosis Osteoporosis is the thinning and loss of density in the bones. Osteoporosis makes the bones more brittle, fragile, and likely to break (fracture). Over time, osteoporosis can cause the bones to become so weak that they fracture after a simple fall. The bones most likely to fracture are the bones  in the hip, wrist, and spine. What are the causes? The exact cause is not known. What increases the risk? Anyone can develop osteoporosis. You may be at greater risk if you have a family history of the condition or have poor nutrition. You may also have a higher risk if you are:  Female.  2 years old or older.  A smoker.  Not physically active.  White or Asian.  Slender.  What are the signs or symptoms? A fracture might be the first sign of the disease, especially if it results from a fall or injury that would not usually cause a bone to break.  Other signs and symptoms include:  Low back and neck pain.  Stooped posture.  Height loss.  How is this diagnosed? To make a diagnosis, your health care provider may:  Take a medical history.  Perform a physical exam.  Order tests, such as: ? A bone mineral density test. ? A dual-energy X-ray absorptiometry test.  How is this treated? The goal of osteoporosis treatment is to strengthen your bones to reduce your risk of a fracture. Treatment may involve:  Making lifestyle changes, such as: ? Eating a diet rich in calcium. ? Doing weight-bearing and muscle-strengthening exercises. ? Stopping tobacco use. ? Limiting alcohol intake.  Taking medicine to slow the process of bone loss or to increase bone density.  Monitoring your levels of calcium and vitamin D.  Follow these instructions at home:  Include calcium and vitamin D in your diet. Calcium is important for bone health, and vitamin D helps the body absorb calcium.  Perform weight-bearing and muscle-strengthening exercises as directed by your health care provider.  Do not use any tobacco products, including cigarettes, chewing tobacco, and electronic cigarettes. If you need help quitting, ask your health care provider.  Limit your alcohol intake.  Take medicines only as directed by your health care provider.  Keep all follow-up visits as directed by your health care provider. This is important.  Take precautions at home to lower your risk of falling, such as: ? Keeping rooms well lit and clutter free. ? Installing safety rails on stairs. ? Using rubber mats in the bathroom and other areas that are often wet or slippery. Get help right away if: You fall or injure yourself. This information is not intended to replace advice given to you by your health care provider. Make sure you discuss any questions you have with your health care provider. Document Released: 09/16/2005 Document Revised: 05/11/2016 Document  Reviewed: 05/17/2014   Bone Health Bones protect organs, store calcium, and anchor muscles. Good health habits, such as eating nutritious foods and exercising regularly, are important for maintaining healthy bones. They can also help to prevent a condition that causes bones to lose density and become weak and brittle (osteoporosis). Why is bone mass important? Bone mass refers to the amount of bone tissue that you have. The higher your bone mass, the stronger your bones. An important step toward having healthy bones throughout life is to have strong and dense bones during childhood. A young adult who has a high bone mass is more likely to have a high bone mass later in life. Bone mass at its greatest it is called peak bone mass. A large decline in bone mass occurs in older adults. In women, it occurs about the time of menopause. During this time, it is important to practice good health habits, because if more bone is lost than what is replaced, the bones  will become less healthy and more likely to break (fracture). If you find that you have a low bone mass, you may be able to prevent osteoporosis or further bone loss by changing your diet and lifestyle. How can I find out if my bone mass is low? Bone mass can be measured with an X-ray test that is called a bone mineral density (BMD) test. This test is recommended for all women who are age 36 or older. It may also be recommended for men who are age 65 or older, or for people who are more likely to develop osteoporosis due to:  Having bones that break easily.  Having a long-term disease that weakens bones, such as kidney disease or rheumatoid arthritis.  Having menopause earlier than normal.  Taking medicine that weakens bones, such as steroids, thyroid hormones, or hormone treatment for breast cancer or prostate cancer.  Smoking.  Drinking three or more alcoholic drinks each day.  What are the nutritional recommendations for healthy bones? To  have healthy bones, you need to get enough of the right minerals and vitamins. Most nutrition experts recommend getting these nutrients from the foods that you eat. Nutritional recommendations vary from person to person. Ask your health care provider what is healthy for you. Here are some general guidelines. Calcium Recommendations Calcium is the most important (essential) mineral for bone health. Most people can get enough calcium from their diet, but supplements may be recommended for people who are at risk for osteoporosis. Good sources of calcium include:  Dairy products, such as low-fat or nonfat milk, cheese, and yogurt.  Dark green leafy vegetables, such as bok choy and broccoli.  Calcium-fortified foods, such as orange juice, cereal, bread, soy beverages, and tofu products.  Nuts, such as almonds.  Follow these recommended amounts for daily calcium intake:  Children, age 854?3: 700 mg.  Children, age 85?8: 1,000 mg.  Children, age 62?13: 1,300 mg.  Teens, age 52?18: 1,300 mg.  Adults, age 63?50: 1,000 mg.  Adults, age 81?70: ? Men: 1,000 mg. ? Women: 1,200 mg.  Adults, age 73 or older: 1,200 mg.  Pregnant and breastfeeding females: ? Teens: 1,300 mg. ? Adults: 1,000 mg.  Vitamin D Recommendations Vitamin D is the most essential vitamin for bone health. It helps the body to absorb calcium. Sunlight stimulates the skin to make vitamin D, so be sure to get enough sunlight. If you live in a cold climate or you do not get outside often, your health care provider may recommend that you take vitamin D supplements. Good sources of vitamin D in your diet include:  Egg yolks.  Saltwater fish.  Milk and cereal fortified with vitamin D.  Follow these recommended amounts for daily vitamin D intake:  Children and teens, age 38?18: 24 international units.  Adults, age 851 or younger: 400-800 international units.  Adults, age 8 or older: 800-1,000 international units.  Other  Nutrients Other nutrients for bone health include:  Phosphorus. This mineral is found in meat, poultry, dairy foods, nuts, and legumes. The recommended daily intake for adult men and adult women is 700 mg.  Magnesium. This mineral is found in seeds, nuts, dark green vegetables, and legumes. The recommended daily intake for adult men is 400?420 mg. For adult women, it is 310?320 mg.  Vitamin K. This vitamin is found in green leafy vegetables. The recommended daily intake is 120 mg for adult men and 90 mg for adult women.  What type of physical activity is best  for building and maintaining healthy bones? Weight-bearing and strength-building activities are important for building and maintaining peak bone mass. Weight-bearing activities cause muscles and bones to work against gravity. Strength-building activities increases muscle strength that supports bones. Weight-bearing and muscle-building activities include:  Walking and hiking.  Jogging and running.  Dancing.  Gym exercises.  Lifting weights.  Tennis and racquetball.  Climbing stairs.  Aerobics.  Adults should get at least 30 minutes of moderate physical activity on most days. Children should get at least 60 minutes of moderate physical activity on most days. Ask your health care provide what type of exercise is best for you. Where can I find more information? For more information, check out the following websites:  [place]: YardHomes.se  [employer] of Health: http://www.niams.AnonymousEar.fr.asp  This information is not intended to replace advice given to you by your health care provider. Make sure you discuss any questions you have with your health care provider. Document Released: 02/27/2004 Document Revised: 06/26/2016 Document Reviewed: 12/12/2014 Elsevier Interactive Patient Education  2018 [employer].  Chartered certified accountant  Patient Education  [employer].  Calcium Intake Recommendations Calcium is a mineral that affects many functions in the body, including:  Blood clotting.  Blood vessel function.  Nerve impulse conduction.  Hormone secretion.  Muscle contraction.  Bone and teeth functions.  Most of your body's calcium supply is stored in your bones and teeth. When your calcium stores are low, you may be at risk for low bone mass, bone loss, and bone fractures. Consuming enough calcium helps to grow healthy bones and teeth and to prevent breakdown over time. It is very important that you get enough calcium if you are:  A child undergoing rapid growth.  An adolescent girl.  A pre- or post-menopausal woman.  A woman whose menstrual cycle has stopped due to anorexia nervosa or regular intense exercise.  An individual with lactose intolerance or a milk allergy.  A vegetarian.  What is my plan? Try to consume the recommended amount of calcium daily based on your age. Depending on your overall health, your health care provider may recommend increased calcium intake.General daily calcium intake recommendations by age are:  Birth to 6 months: 200 mg.  Infants 7 to 12 months: 260 mg.  Children 1 to 3 years: 700 mg.  Children 4 to 8 years: 1,000 mg.  Children 9 to 13 years: 1,300 mg.  Teens 14 to 18 years: 1,300 mg.  Adults 19 to 50 years: 1,000 mg.  Adult women 51 to 70 years: 1,200 mg.  Adult men 51 to 70 years: 1,000 mg.  Adults 71 years and older: 1,200 mg.  Pregnant and breastfeeding teens: 1,300 mg.  Pregnant and breastfeeding adults: 1,000 mg.  What do I need to know about calcium intake?  In order for the body to absorb calcium, it needs vitamin D. You can get vitamin D through: ? Direct exposure of the skin to sunlight. ? Foods, such as egg yolks, liver, saltwater fish, and fortified milk. ? Supplements.  Consuming too much calcium may  cause: ? Constipation. ? Decreased absorption of iron and zinc. ? Kidney stones.  Calcium supplements may interact with certain medicines. Check with your health care provider before starting any calcium supplements.  Try to get most of your calcium from food. What foods can I eat? Grains  Fortified oatmeal. Fortified ready-to-eat cereals. Fortified frozen waffles. Vegetables Turnip greens. Broccoli. Fruits Fortified orange juice. Meats and Other Protein Sources Canned sardines  with bones. Canned salmon with bones. Soy beans. Tofu. Baked beans. Almonds. [place] nuts. Sunflower seeds. Dairy Milk. Yogurt. Cheese. Cottage cheese. Beverages Fortified soy milk. Fortified rice milk. Sweets/Desserts Pudding. Ice Cream. Milkshakes. Blackstrap molasses. The items listed above may not be a complete list of recommended foods or beverages. Contact your dietitian for more options. What foods can affect my calcium intake? It may be more difficult for your body to use calcium or calcium may leave your body more quickly if you consume large amounts of:  Sodium.  Protein.  Caffeine.  Alcohol.  This information is not intended to replace advice given to you by your health care provider. Make sure you discuss any questions you have with your health care provider. Document Released: 07/21/2004 Document Revised: 06/26/2016 Document Reviewed: 05/15/2014 Elsevier Interactive Patient Education  2018 [employer].

## 2018-10-30 LAB — VITAMIN B12: VITAMIN B 12: 561 pg/mL (ref 232–1245)

## 2018-10-31 LAB — CBC WITH DIFFERENTIAL/PLATELET
Basophils Absolute: 0 10*3/uL (ref 0.0–0.2)
Basos: 1 %
EOS (ABSOLUTE): 0 10*3/uL (ref 0.0–0.4)
EOS: 1 %
Hematocrit: 36.2 % (ref 34.0–46.6)
Hemoglobin: 12.9 g/dL (ref 11.1–15.9)
IMMATURE GRANULOCYTES: 0 %
Immature Grans (Abs): 0 10*3/uL (ref 0.0–0.1)
Lymphocytes Absolute: 1.6 10*3/uL (ref 0.7–3.1)
Lymphs: 40 %
MCH: 34.1 pg — ABNORMAL HIGH (ref 26.6–33.0)
MCHC: 35.6 g/dL (ref 31.5–35.7)
MCV: 96 fL (ref 79–97)
MONOS ABS: 0.4 10*3/uL (ref 0.1–0.9)
Monocytes: 10 %
NEUTROS PCT: 48 %
Neutrophils Absolute: 2 10*3/uL (ref 1.4–7.0)
PLATELETS: 219 10*3/uL (ref 150–450)
RBC: 3.78 x10E6/uL (ref 3.77–5.28)
RDW: 12 % — AB (ref 12.3–15.4)
WBC: 4 10*3/uL (ref 3.4–10.8)

## 2018-10-31 LAB — COMPREHENSIVE METABOLIC PANEL
ALT: 16 IU/L (ref 0–32)
AST: 19 IU/L (ref 0–40)
Albumin/Globulin Ratio: 2.4 — ABNORMAL HIGH (ref 1.2–2.2)
Albumin: 4.5 g/dL (ref 3.6–4.8)
Alkaline Phosphatase: 78 IU/L (ref 39–117)
BUN / CREAT RATIO: 21 (ref 12–28)
BUN: 14 mg/dL (ref 8–27)
Bilirubin Total: 0.2 mg/dL (ref 0.0–1.2)
CALCIUM: 9.2 mg/dL (ref 8.7–10.3)
CHLORIDE: 101 mmol/L (ref 96–106)
CO2: 23 mmol/L (ref 20–29)
Creatinine, Ser: 0.67 mg/dL (ref 0.57–1.00)
GFR calc non Af Amer: 90 mL/min/{1.73_m2} (ref >59)
GFR, EST AFRICAN AMERICAN: 104 mL/min/{1.73_m2} (ref >59)
Globulin, Total: 1.9 g/dL (ref 1.5–4.5)
Glucose: 96 mg/dL (ref 65–99)
Potassium: 4.4 mmol/L (ref 3.5–5.2)
SODIUM: 139 mmol/L (ref 134–144)
Total Protein: 6.4 g/dL (ref 6.0–8.5)

## 2018-10-31 LAB — TSH: TSH: 1.45 u[IU]/mL (ref 0.450–4.500)

## 2018-10-31 LAB — VITAMIN D 25 HYDROXY (VIT D DEFICIENCY, FRACTURES): VIT D 25 HYDROXY: 31.1 ng/mL (ref 30.0–100.0)

## 2018-11-11 ENCOUNTER — Encounter: Payer: Self-pay | Admitting: Family Medicine

## 2018-11-11 ENCOUNTER — Other Ambulatory Visit: Payer: Self-pay

## 2018-11-11 ENCOUNTER — Ambulatory Visit (INDEPENDENT_AMBULATORY_CARE_PROVIDER_SITE_OTHER): Payer: Medicare Other | Admitting: Family Medicine

## 2018-11-11 VITALS — BP 108/69 | HR 76 | Temp 98.5°F | Ht 66.0 in | Wt 116.8 lb

## 2018-11-11 DIAGNOSIS — H6502 Acute serous otitis media, left ear: Secondary | ICD-10-CM | POA: Diagnosis not present

## 2018-11-11 DIAGNOSIS — H6691 Otitis media, unspecified, right ear: Secondary | ICD-10-CM

## 2018-11-11 DIAGNOSIS — H9201 Otalgia, right ear: Secondary | ICD-10-CM | POA: Diagnosis not present

## 2018-11-11 DIAGNOSIS — H6121 Impacted cerumen, right ear: Secondary | ICD-10-CM

## 2018-11-11 MED ORDER — AMOXICILLIN 875 MG PO TABS: 875.0000 mg | ORAL_TABLET | Freq: Two times a day (BID) | ORAL | 0 refills | Status: DC

## 2018-11-11 NOTE — Progress Notes (Signed)
Subjective:  By signing my name below, I, [name], attest that this documentation has been prepared under the direction and in the presence of [name], MD. Electronically Signed: [name], [place]. 11/11/2018 , 4:46 PM .  Patient was seen in Room 14 .   Patient ID: Kristine Davidson, female    DOB: [birth year], 67 y.o.   MRN: [id number] Chief Complaint  Patient presents with  . Otalgia    right ear pain and fluid in left ear   HPI Kristine Davidson is a 66 y.o. female Patient saw Dr. Brigitte Pulse 2 weeks prior on Nov 9th, was informed right ear possibly infected, and left ear with some fluid. She was instructed to use antibiotic drops for 10 days. She stopped using the drops on Wednesday (2 days ago), but the fullness and fluid doesn't seen to be resolved. She had some improvement when using the ear drops. She's also been using a nasal spray in the morning, 2 sprays per nare each day; although, she doesn't know if there was any relief with it. She denies any changes with hearing. She hasn't noticed any popping in her ear.   Patient Active Problem List   Diagnosis Date Noted  . Osteoporosis 01/14/2018  . Anemia 10/29/2016  . Depression with anxiety 02/28/2015  . HSV-2 (herpes simplex virus 2) infection 03/31/2012  . Vitamin D deficiency disease 03/31/2012  . H/O diverticulitis of colon 02/17/2012  . Hypothyroid 02/17/2012   Past Medical History:  Diagnosis Date  . Anemia   . B12 deficiency 03/31/2012  . Depression   . Diverticular disease   . HSV-2 (herpes simplex virus 2) infection   . Hyperlipidemia   . Hypertension   . Mitral valve prolapse   . Osteoporosis 01/14/2018   DEX[facility] at [facility] 01/14/18 T score -2.8 at Rt femur neck and L1-L4 AP spine  . Substance abuse ([place])   . Thyroid disease    Past Surgical History:  Procedure Laterality Date  . D and C    . TONSILLECTOMY    . VARICOSE VEIN SURGERY  2000   Allergies  Allergen Reactions  . Codeine  Itching   Prior to Admission medications   Medication Sig Start Date End Date Taking? Authorizing Provider  buPROPion (WELLBUTRIN XL) 150 MG 24 hr tablet Take 1 tablet (150 mg total) by mouth daily. 10/29/18   Shawnee Knapp, MD  levothyroxine (SYNTHROID, LEVOTHROID) 50 MCG tablet TAKE 1 TABLET (50 MCG TOTAL) BY MOUTH DAILY. 10/29/18   Shawnee Knapp, MD  neomycin-polymyxin-hydrocortisone (CORTISPORIN) OTIC solution Place 4 drops into the right ear 4 (four) times daily. 10/29/18   Shawnee Knapp, MD  PARoxetine (PAXIL) 30 MG tablet Take 1 tablet (30 mg total) by mouth at bedtime. 10/29/18   Shawnee Knapp, MD  traZODone (DESYREL) 50 MG tablet Take 1-2 tablets (50-100 mg total) by mouth at bedtime. 10/29/18   Shawnee Knapp, MD  triamcinolone (NASACORT) 55 MCG/ACT AERO nasal inhaler Place 2 sprays into the nose daily. 10/29/18   Shawnee Knapp, MD  valACYclovir (VALTREX) 1000 MG tablet Take 1 tablet (1,000 mg total) by mouth daily. 10/29/18   Shawnee Knapp, MD   Social History   Socioeconomic History  . Marital status: Divorced    Spouse name: Not on file  . Number of children: 0  . Years of education: Not on file  . Highest education level: Professional school degree (e.g., MD, DDS, DVM, JD)  Occupational History  . 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 Financial resource strain: Not very hard  . Food insecurity:    Worry: Never true    Inability: Never true  . Transportation needs:    Medical: No    Non-medical: No  Tobacco Use  . Smoking status: Current Every Day Smoker    Packs/day: 0.50    Years: 43.00    Pack years: 21.50    Types: Cigarettes  . Smokeless tobacco: Never Used  Substance and Sexual Activity  . Alcohol use: No    Alcohol/week: 0.0 standard drinks  . Drug use: No  . Sexual activity: Never  Lifestyle  . Physical activity:    Days per week: 0 days    Minutes per session: 0 min  . Stress: Not at all    Relationships  . Social connections:    Talks on phone: More than three times a week    Gets together: More than three times a week    Attends religious service: Never    Active member of club or organization: Yes    Attends meetings of clubs or organizations: More than 4 times per year    Relationship status: Divorced  . Intimate partner violence:    Fear of current or ex partner: No    Emotionally abused: No    Physically abused: No    Forced sexual activity: No  Other Topics Concern  . Not on file  Social History Narrative   Lives alone.    Review of Systems  Constitutional: Negative for chills, fatigue, fever and unexpected weight change.  HENT: Positive for ear discharge (left ear) and ear pain (right ear). Negative for hearing loss.   Respiratory: Negative for cough.   Gastrointestinal: Negative for constipation, diarrhea, nausea and vomiting.  Skin: Negative for rash and wound.  Neurological: Negative for dizziness, weakness and headaches.       Objective:   Physical Exam  Constitutional: She is oriented to person, place, and time. She appears well-developed and well-nourished. No distress.  HENT:  Head: Normocephalic and atraumatic.  Mouth/Throat: Oropharynx is clear and moist. No oropharyngeal exudate.  Right ear: tenderness below the right ear, moderate cerumen, thick whitish yellow cerumen in the canal, no erythema or exudate (5:20 PM) After cerumen lavage, canal clear, slight erythema, but right TM appears bulging and erythematous, no apparent rupture Left ear: small amount of clear fluid at the base of left TM TMJ non tender, no apparent clicking  Eyes: Pupils are equal, round, and reactive to light. EOM are normal.  Neck: Neck supple.  Cardiovascular: Normal rate.  Pulmonary/Chest: Effort normal. No respiratory distress.  Musculoskeletal: Normal range of motion.  Neurological: She is alert and oriented to person, place, and time.  Skin: Skin is warm and  dry.  Psychiatric: She has a normal mood and affect. Her behavior is normal.  Nursing note and vitals reviewed.   Vitals:   11/11/18 1617  BP: 108/69  Pulse: 76  Temp: 98.5 F (36.9 C)  TempSrc: Oral  SpO2: 94%  Weight: 116 lb 12.8 oz (53 kg)  Height: 5\' 6"  (1.676 m)       Assessment & Plan:    TEYLOR Kristine Davidson is a 67 y.o. female Otalgia of right ear  Impacted cerumen of right ear - Plan: Ear wax removal  Acute serous otitis media of left ear, recurrence not specified -  Plan: Ear wax removal  Right otitis media, unspecified otitis media type - Plan: amoxicillin (AMOXIL) 875 MG tablet  Suspected secondary right acute otitis media with persistent left serous otitis.   -  Start amoxicillin, potential side effects discussed, continue nasal spray for left ear symptoms.   -  -Cerumen removed without difficulty, okay to try eardrops as needed for any irritation.  RTC precautions given.  Meds ordered this encounter  Medications  . amoxicillin (AMOXIL) 875 MG tablet    Sig: Take 1 tablet (875 mg total) by mouth 2 (two) times daily.    Dispense:  20 tablet    Refill:  0   Patient Instructions   Start amoxicillin for right middle ear infection.  Continue nasal spray as that should help with the fluid behind the left ear.  Canals look okay after wax was removed.  If you do have some irritation in the canal,  okay to use the eardrops that were prescribed last visit for few days. Return to the clinic or go to the nearest emergency room if any of your symptoms worsen or new symptoms occur.   Otitis Media, Adult Otitis media occurs when there is inflammation and fluid in the middle ear. Your middle ear is a part of the ear that contains bones for hearing as well as air that helps send sounds to your brain. What are the causes? This condition is caused by a blockage in the eustachian tube. This tube drains fluid from the ear to the back of the nose (nasopharynx). A blockage in this  tube can be caused by an object or by swelling (edema) in the tube. Problems that can cause a blockage include:  A cold or other upper respiratory infection.  Allergies.  An irritant, such as tobacco smoke.  Enlarged adenoids. The adenoids are areas of soft tissue located high in the back of the throat, behind the nose and the roof of the mouth.  A mass in the nasopharynx.  Damage to the ear caused by pressure changes (barotrauma).  What are the signs or symptoms? Symptoms of this condition include:  Ear pain.  A fever.  Decreased hearing.  A headache.  Tiredness (lethargy).  Fluid leaking from the ear.  Ringing in the ear.  How is this diagnosed? This condition is diagnosed with a physical exam. During the exam your health care provider will use an instrument called an otoscope to look into your ear and check for redness, swelling, and fluid. He or she will also ask about your symptoms. Your health care provider may also order tests, such as:  A test to check the movement of the eardrum (pneumatic otoscopy). This test is done by squeezing a small amount of air into the ear.  A test that changes air pressure in the middle ear to check how well the eardrum moves and whether the eustachian tube is working (tympanogram).  How is this treated? This condition usually goes away on its own within 3-5 days. But if the condition is caused by a bacteria infection and does not go away own its own, or keeps coming back, your health care provider may:  Prescribe antibiotic medicines to treat the infection.  Prescribe or recommend medicines to control pain.  Follow these instructions at home:  Take over-the-counter and prescription medicines only as told by your health care provider.  If you were prescribed an antibiotic medicine, take it as told by your health care provider. Do not stop taking the  antibiotic even if you start to feel better.  Keep all follow-up visits as told  by your health care provider. This is important. Contact a health care provider if:  You have bleeding from your nose.  There is a lump on your neck.  You are not getting better in 5 days.  You feel worse instead of better. Get help right away if:  You have severe pain that is not controlled with medicine.  You have swelling, redness, or pain around your ear.  You have stiffness in your neck.  A part of your face is paralyzed.  The bone behind your ear (mastoid) is tender when you touch it.  You develop a severe headache. Summary  Otitis media is redness, soreness, and swelling of the middle ear.  This condition usually goes away on its own within 3-5 days.  If the problem does not go away in 3-5 days, your health care provider may prescribe or recommend medicines to treat your symptoms.  If you were prescribed an antibiotic medicine, take it as told by your health care provider. This information is not intended to replace advice given to you by your health care provider. Make sure you discuss any questions you have with your health care provider. Document Released: 09/11/2004 Document Revised: 11/27/2016 Document Reviewed: 11/27/2016 Elsevier Interactive Patient Education  2018 [place], Adult The ears produce a substance called earwax that helps keep bacteria out of the ear and protects the skin in the ear canal. Occasionally, earwax can build up in the ear and cause discomfort or hearing loss. What increases the risk? This condition is more likely to develop in people who:  Are female.  Are elderly.  Naturally produce more earwax.  Clean their ears often with cotton swabs.  Use earplugs often.  Use in-ear headphones often.  Wear hearing aids.  Have narrow ear canals.  Have earwax that is overly thick or sticky.  Have eczema.  Are dehydrated.  Have excess hair in the ear canal.  What are the signs or symptoms? Symptoms of  this condition include:  Reduced or muffled hearing.  A feeling of fullness in the ear or feeling that the ear is plugged.  Fluid coming from the ear.  Ear pain.  Ear itch.  Ringing in the ear.  Coughing.  An obvious piece of earwax that can be seen inside the ear canal.  How is this diagnosed? This condition may be diagnosed based on:  Your symptoms.  Your medical history.  An ear exam. During the exam, your health care provider will look into your ear with an instrument called an otoscope.  You may have tests, including a hearing test. How is this treated? This condition may be treated by:  Using ear drops to soften the earwax.  Having the earwax removed by a health care provider. The health care provider may: ? Flush the ear with water. ? Use an instrument that has a loop on the end (curette). ? Use a suction device.  Surgery to remove the wax buildup. This may be done in severe cases.  Follow these instructions at home:  Take over-the-counter and prescription medicines only as told by your health care provider.  Do not put any objects, including cotton swabs, into your ear. You can clean the opening of your ear canal with a washcloth or facial tissue.  Follow instructions from your health care provider about cleaning your ears. Do not over-clean your ears.  Drink enough fluid to keep your urine clear or pale yellow. This will help to thin the earwax.  Keep all follow-up visits as told by your health care provider. If earwax builds up in your ears often or if you use hearing aids, consider seeing your health care provider for routine, preventive ear cleanings. Ask your health care provider how often you should schedule your cleanings.  If you have hearing aids, clean them according to instructions from the manufacturer and your health care provider. Contact a health care provider if:  You have ear pain.  You develop a fever.  You have blood, pus, or other  fluid coming from your ear.  You have hearing loss.  You have ringing in your ears that does not go away.  Your symptoms do not improve with treatment.  You feel like the room is spinning (vertigo). Summary  Earwax can build up in the ear and cause discomfort or hearing loss.  The most common symptoms of this condition include reduced or muffled hearing and a feeling of fullness in the ear or feeling that the ear is plugged.  This condition may be diagnosed based on your symptoms, your medical history, and an ear exam.  This condition may be treated by using ear drops to soften the earwax or by having the earwax removed by a health care provider.  Do not put any objects, including cotton swabs, into your ear. You can clean the opening of your ear canal with a washcloth or facial tissue. This information is not intended to replace advice given to you by your health care provider. Make sure you discuss any questions you have with your health care provider. Document Released: 01/14/2005 Document Revised: 02/17/2017 Document Reviewed: 02/17/2017 Elsevier Interactive Patient Education  [employer].   If you have lab work done today you will be contacted with your lab results within the next 2 weeks.  If you have not heard from [place] then please contact us. The fastest way to get your results is to register for My Chart.   IF you received an x-ray today, you will receive an invoice from [facility]. Please contact [facility] at [phone] with questions or concerns regarding your invoice.   IF you received labwork today, you will receive an invoice from [facility]. Please contact [facility] at [phone] with questions or concerns regarding your invoice.   Our billing staff will not be able to assist you with questions regarding bills from these companies.  You will be contacted with the lab results as soon as they are available. The fastest way to get your results  is to activate your My Chart account. Instructions are located on the last page of this paperwork. If you have not heard from [place] regarding the results in 2 weeks, please contact this office.       I personally performed the services described in this documentation, which was scribed in my presence. The recorded information has been reviewed and considered for accuracy and completeness, addended by me as needed, and agree with information above.  Signed,   [name], MD [facility] [place].  11/15/18 8:50 PM

## 2018-11-11 NOTE — Patient Instructions (Addendum)
Start amoxicillin for right middle ear infection.  Continue nasal spray as that should help with the fluid behind the left ear.  Canals look okay after wax was removed.  If you do have some irritation in the canal,  okay to use the eardrops that were prescribed last visit for few days. Return to the clinic or go to the nearest emergency room if any of your symptoms worsen or new symptoms occur.   Otitis Media, Adult Otitis media occurs when there is inflammation and fluid in the middle ear. Your middle ear is a part of the ear that contains bones for hearing as well as air that helps send sounds to your brain. What are the causes? This condition is caused by a blockage in the eustachian tube. This tube drains fluid from the ear to the back of the nose (nasopharynx). A blockage in this tube can be caused by an object or by swelling (edema) in the tube. Problems that can cause a blockage include:  A cold or other upper respiratory infection.  Allergies.  An irritant, such as tobacco smoke.  Enlarged adenoids. The adenoids are areas of soft tissue located high in the back of the throat, behind the nose and the roof of the mouth.  A mass in the nasopharynx.  Damage to the ear caused by pressure changes (barotrauma).  What are the signs or symptoms? Symptoms of this condition include:  Ear pain.  A fever.  Decreased hearing.  A headache.  Tiredness (lethargy).  Fluid leaking from the ear.  Ringing in the ear.  How is this diagnosed? This condition is diagnosed with a physical exam. During the exam your health care provider will use an instrument called an otoscope to look into your ear and check for redness, swelling, and fluid. He or she will also ask about your symptoms. Your health care provider may also order tests, such as:  A test to check the movement of the eardrum (pneumatic otoscopy). This test is done by squeezing a small amount of air into the ear.  A test that  changes air pressure in the middle ear to check how well the eardrum moves and whether the eustachian tube is working (tympanogram).  How is this treated? This condition usually goes away on its own within 3-5 days. But if the condition is caused by a bacteria infection and does not go away own its own, or keeps coming back, your health care provider may:  Prescribe antibiotic medicines to treat the infection.  Prescribe or recommend medicines to control pain.  Follow these instructions at home:  Take over-the-counter and prescription medicines only as told by your health care provider.  If you were prescribed an antibiotic medicine, take it as told by your health care provider. Do not stop taking the antibiotic even if you start to feel better.  Keep all follow-up visits as told by your health care provider. This is important. Contact a health care provider if:  You have bleeding from your nose.  There is a lump on your neck.  You are not getting better in 5 days.  You feel worse instead of better. Get help right away if:  You have severe pain that is not controlled with medicine.  You have swelling, redness, or pain around your ear.  You have stiffness in your neck.  A part of your face is paralyzed.  The bone behind your ear (mastoid) is tender when you touch it.  You develop a  severe headache. Summary  Otitis media is redness, soreness, and swelling of the middle ear.  This condition usually goes away on its own within 3-5 days.  If the problem does not go away in 3-5 days, your health care provider may prescribe or recommend medicines to treat your symptoms.  If you were prescribed an antibiotic medicine, take it as told by your health care provider. This information is not intended to replace advice given to you by your health care provider. Make sure you discuss any questions you have with your health care provider. Document Released: 09/11/2004 Document  Revised: 11/27/2016 Document Reviewed: 11/27/2016 Elsevier Interactive Patient Education  2018 [place], Adult The ears produce a substance called earwax that helps keep bacteria out of the ear and protects the skin in the ear canal. Occasionally, earwax can build up in the ear and cause discomfort or hearing loss. What increases the risk? This condition is more likely to develop in people who:  Are female.  Are elderly.  Naturally produce more earwax.  Clean their ears often with cotton swabs.  Use earplugs often.  Use in-ear headphones often.  Wear hearing aids.  Have narrow ear canals.  Have earwax that is overly thick or sticky.  Have eczema.  Are dehydrated.  Have excess hair in the ear canal.  What are the signs or symptoms? Symptoms of this condition include:  Reduced or muffled hearing.  A feeling of fullness in the ear or feeling that the ear is plugged.  Fluid coming from the ear.  Ear pain.  Ear itch.  Ringing in the ear.  Coughing.  An obvious piece of earwax that can be seen inside the ear canal.  How is this diagnosed? This condition may be diagnosed based on:  Your symptoms.  Your medical history.  An ear exam. During the exam, your health care provider will look into your ear with an instrument called an otoscope.  You may have tests, including a hearing test. How is this treated? This condition may be treated by:  Using ear drops to soften the earwax.  Having the earwax removed by a health care provider. The health care provider may: ? Flush the ear with water. ? Use an instrument that has a loop on the end (curette). ? Use a suction device.  Surgery to remove the wax buildup. This may be done in severe cases.  Follow these instructions at home:  Take over-the-counter and prescription medicines only as told by your health care provider.  Do not put any objects, including cotton swabs, into your ear.  You can clean the opening of your ear canal with a washcloth or facial tissue.  Follow instructions from your health care provider about cleaning your ears. Do not over-clean your ears.  Drink enough fluid to keep your urine clear or pale yellow. This will help to thin the earwax.  Keep all follow-up visits as told by your health care provider. If earwax builds up in your ears often or if you use hearing aids, consider seeing your health care provider for routine, preventive ear cleanings. Ask your health care provider how often you should schedule your cleanings.  If you have hearing aids, clean them according to instructions from the manufacturer and your health care provider. Contact a health care provider if:  You have ear pain.  You develop a fever.  You have blood, pus, or other fluid coming from your ear.  You have  hearing loss.  You have ringing in your ears that does not go away.  Your symptoms do not improve with treatment.  You feel like the room is spinning (vertigo). Summary  Earwax can build up in the ear and cause discomfort or hearing loss.  The most common symptoms of this condition include reduced or muffled hearing and a feeling of fullness in the ear or feeling that the ear is plugged.  This condition may be diagnosed based on your symptoms, your medical history, and an ear exam.  This condition may be treated by using ear drops to soften the earwax or by having the earwax removed by a health care provider.  Do not put any objects, including cotton swabs, into your ear. You can clean the opening of your ear canal with a washcloth or facial tissue. This information is not intended to replace advice given to you by your health care provider. Make sure you discuss any questions you have with your health care provider. Document Released: 01/14/2005 Document Revised: 02/17/2017 Document Reviewed: 02/17/2017 Elsevier Interactive Patient Education  [employer].   If you have lab work done today you will be contacted with your lab results within the next 2 weeks.  If you have not heard from [place] then please contact us. The fastest way to get your results is to register for My Chart.   IF you received an x-ray today, you will receive an invoice from [facility]. Please contact [facility] Radiology at [phone] with questions or concerns regarding your invoice.   IF you received labwork today, you will receive an invoice from [place]. Please contact [facility] at [phone] with questions or concerns regarding your invoice.   Our billing staff will not be able to assist you with questions regarding bills from these companies.  You will be contacted with the lab results as soon as they are available. The fastest way to get your results is to activate your My Chart account. Instructions are located on the last page of this paperwork. If you have not heard from [place] regarding the results in 2 weeks, please contact this office.

## 2018-12-02 ENCOUNTER — Telehealth: Payer: Self-pay | Admitting: Family Medicine

## 2018-12-02 NOTE — Telephone Encounter (Signed)
mychart message sent to pt about rescheduling their apt on 05/04/19

## 2018-12-07 ENCOUNTER — Other Ambulatory Visit: Payer: Self-pay | Admitting: Physician Assistant

## 2018-12-07 DIAGNOSIS — L708 Other acne: Secondary | ICD-10-CM | POA: Diagnosis not present

## 2018-12-07 DIAGNOSIS — D485 Neoplasm of uncertain behavior of skin: Secondary | ICD-10-CM | POA: Diagnosis not present

## 2018-12-15 DIAGNOSIS — Z4802 Encounter for removal of sutures: Secondary | ICD-10-CM | POA: Diagnosis not present

## 2018-12-16 ENCOUNTER — Ambulatory Visit
Admission: RE | Admit: 2018-12-16 | Discharge: 2018-12-16 | Disposition: A | Discharge status: Home / Self Care | Payer: Medicare Other | Source: Ambulatory Visit | Attending: Family Medicine | Admitting: Family Medicine

## 2018-12-16 DIAGNOSIS — Z1231 Encounter for screening mammogram for malignant neoplasm of breast: Secondary | ICD-10-CM | POA: Diagnosis not present

## 2019-02-01 ENCOUNTER — Encounter: Payer: Self-pay | Admitting: Family Medicine

## 2019-05-04 ENCOUNTER — Ambulatory Visit: Payer: Medicare Other | Admitting: Family Medicine

## 2019-09-05 DIAGNOSIS — Z23 Encounter for immunization: Secondary | ICD-10-CM | POA: Diagnosis not present

## 2019-12-07 ENCOUNTER — Ambulatory Visit (INDEPENDENT_AMBULATORY_CARE_PROVIDER_SITE_OTHER): Payer: Medicare Other | Admitting: Family Medicine

## 2019-12-07 ENCOUNTER — Other Ambulatory Visit: Payer: Self-pay

## 2019-12-07 ENCOUNTER — Encounter: Payer: Self-pay | Admitting: Family Medicine

## 2019-12-07 VITALS — BP 122/79 | HR 82 | Temp 98.2°F | Wt 119.4 lb

## 2019-12-07 DIAGNOSIS — F5101 Primary insomnia: Secondary | ICD-10-CM | POA: Diagnosis not present

## 2019-12-07 DIAGNOSIS — F418 Other specified anxiety disorders: Secondary | ICD-10-CM

## 2019-12-07 DIAGNOSIS — L853 Xerosis cutis: Secondary | ICD-10-CM

## 2019-12-07 DIAGNOSIS — D229 Melanocytic nevi, unspecified: Secondary | ICD-10-CM

## 2019-12-07 DIAGNOSIS — R42 Dizziness and giddiness: Secondary | ICD-10-CM

## 2019-12-07 DIAGNOSIS — R0981 Nasal congestion: Secondary | ICD-10-CM

## 2019-12-07 DIAGNOSIS — E039 Hypothyroidism, unspecified: Secondary | ICD-10-CM | POA: Diagnosis not present

## 2019-12-07 DIAGNOSIS — B009 Herpesviral infection, unspecified: Secondary | ICD-10-CM

## 2019-12-07 MED ORDER — PAROXETINE HCL 30 MG PO TABS: 30.0000 mg | ORAL_TABLET | Freq: Every day | ORAL | 3 refills | Status: DC

## 2019-12-07 MED ORDER — VALACYCLOVIR HCL 1 G PO TABS: 1000.0000 mg | ORAL_TABLET | Freq: Every day | ORAL | 3 refills | Status: DC

## 2019-12-07 MED ORDER — TRAZODONE HCL 50 MG PO TABS: 50.0000 mg | ORAL_TABLET | Freq: Every day | ORAL | 3 refills | Status: AC

## 2019-12-07 MED ORDER — LEVOTHYROXINE SODIUM 50 MCG PO TABS: ORAL_TABLET | ORAL | 3 refills | Status: AC

## 2019-12-07 MED ORDER — BUPROPION HCL ER (XL) 150 MG PO TB24: 150.0000 mg | ORAL_TABLET | Freq: Every day | ORAL | 3 refills | Status: DC

## 2019-12-07 NOTE — Patient Instructions (Addendum)
  Try restarting Nasacort nasal spray to see if that will help with the balance/dizziness as sinus congestion can sometimes cause those symptoms.  Follow-up in 3 weeks to discuss the symptoms further, sooner if any worsening symptoms.  Potentially we may need to review your medications to make sure the combination of meds is not causing the symptoms, or can certainly have neurology evaluate you as well given your family history.  Again try the Nasacort first to see if that helps. Return to the clinic or go to the nearest emergency room if any of your symptoms worsen or new symptoms occur.  No other change in medications for now.  See information below on dry skin care, but I do like Eucerin, Aveeno, Cetaphil lotion.   I will refer you to dermatology.  I do check some screening blood work today as well.  Thank you for coming in today, and take care.    Drink at least 64 ounces of water daily. Consider a humidifier for the room where you sleep. Bathe once daily. Avoid using HOT water, as it dries skin.  Avoid deodorant soaps (Dial is the worst!) and stick with gentle cleansers (I like Cetaphil Liquid Cleanser). After bathing, dry off completely, then apply a thick emollient cream (I like Cetaphil Moisturizing Cream). Apply the cream twice daily, or more!    If you have lab work done today you will be contacted with your lab results within the next 2 weeks.  If you have not heard from [place] then please contact us. The fastest way to get your results is to register for My Chart.   IF you received an x-ray today, you will receive an invoice from [facility]. Please contact [facility] Rad[facility] at [phone] with questions or concerns regarding your invoice.   IF you received labwork today, you will receive an invoice from [place]. Please contact [facility] at [phone] with questions or concerns regarding your invoice.   Our billing staff will not be able to assist you with questions  regarding bills from these companies.  You will be contacted with the lab results as soon as they are available. The fastest way to get your results is to activate your My Chart account. Instructions are located on the last page of this paperwork. If you have not heard from [place] regarding the results in 2 weeks, please contact this office.

## 2019-12-07 NOTE — Progress Notes (Signed)
Subjective:  Patient ID: Kristine Davidson, female    DOB: December 03, 1951  Age: 68 y.o. MRN: [id number]  CC:  Chief Complaint  Patient presents with  . Establish Care    Est care and need refills on all rx. GAD7=4.l Would like to get ears checked as well  . Nevus    Need a referral to have moles checked at [facility] presents for  Establish care, med refills, other concerns as above.  Dermatology referral request Robyne Askew at [facility] - once per year, suspicious moles, no history of skin cancer. Lives by self  - unable to see areas    Allergic rhinitis: Nasacort nasal spray - has if needed. Not using recently - some congestion recently - chronic nasal congestion. Balance issue at times - past 3 months.  No focal weakness.  No hearing loss.  FH of parkinsons's/lewy body dementia inmultiple family members.   History of HSV-2 infection Last flare: years ago.  Daily Valtrex 1000 mg - 1/2 per day has been effective for prevention.   Hypothyroidism: Lab Results  Component Value Date   TSH 1.450 10/29/2018  Taking medication daily.  Synthroid 50 mcg daily. No new hot or cold intolerance. No new hair or skin changes  - dry skin at times.  heart palpitations or new fatigue. No new weight changes.   Depression/anxiety: Currently takes Wellbutrin 150 mg XL daily, Paxil 30 mg daily, trazodone 50 mg nightly - helps with sleep.   Sober since 2006. - 01/30/05 Alcohol. No drinks since. No IDU.   Depression screen [facility] 2/9 12/07/2019 11/11/2018 11/10/2017 11/02/2017 10/11/2017  Decreased Interest 0 0 0 0 0  Down, Depressed, Hopeless 0 0 0 0 0  PHQ - 2 Score 0 0 0 0 0   GAD 7 : Generalized Anxiety Score 12/07/2019  Nervous, Anxious, on Edge 1  Control/stop worrying 1  Worry too much - different things 1  Trouble relaxing 0  Restless 0  Easily annoyed or irritable 1  Afraid - awful might happen 0  Total GAD 7 Score 4      History Patient Active  Problem List   Diagnosis Date Noted  . Osteoporosis 01/14/2018  . Anemia 10/29/2016  . Depression with anxiety 02/28/2015  . HSV-2 (herpes simplex virus 2) infection 03/31/2012  . Vitamin D deficiency disease 03/31/2012  . H/O diverticulitis of colon 02/17/2012  . Hypothyroid 02/17/2012   Past Medical History:  Diagnosis Date  . Anemia   . B12 deficiency 03/31/2012  . Depression   . Diverticular disease   . HSV-2 (herpes simplex virus 2) infection   . Hyperlipidemia   . Hypertension   . Mitral valve prolapse   . Osteoporosis 01/14/2018   DEXA [facility] at [facility] 01/14/18 T score -2.8 at Rt femur neck and L1-L4 AP spine  . Substance abuse ([place])   . Thyroid disease    Past Surgical History:  Procedure Laterality Date  . D and C    . TONSILLECTOMY    . VARICOSE VEIN SURGERY  2000   Allergies  Allergen Reactions  . Codeine Itching   Prior to Admission medications   Medication Sig Start Date End Date Taking? Authorizing Provider  buPROPion (WELLBUTRIN XL) 150 MG 24 hr tablet Take 1 tablet (150 mg total) by mouth daily. 10/29/18  Yes Shawnee Knapp, MD  levothyroxine (SYNTHROID, LEVOTHROID) 50 MCG tablet TAKE 1 TABLET (50 MCG TOTAL) BY MOUTH  DAILY. 10/29/18  Yes Shawnee Knapp, MD  PARoxetine (PAXIL) 30 MG tablet Take 1 tablet (30 mg total) by mouth at bedtime. 10/29/18  Yes Shawnee Knapp, MD  traZODone (DESYREL) 50 MG tablet Take 1-2 tablets (50-100 mg total) by mouth at bedtime. 10/29/18  Yes Shawnee Knapp, MD  triamcinolone (NASACORT) 55 MCG/ACT AERO nasal inhaler Place 2 sprays into the nose daily. 10/29/18  Yes Shawnee Knapp, MD  valACYclovir (VALTREX) 1000 MG tablet Take 1 tablet (1,000 mg total) by mouth daily. 10/29/18  Yes Shawnee Knapp, MD   Social History   Socioeconomic History  . Marital status: Divorced    Spouse name: Not on file  . Number of children: 0  . Years of education: Not on file  . Highest education level: Professional school degree (e.g., MD, DDS, DVM, JD)    Occupational History  . 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 Smoking status: Current Every Day Smoker    Packs/day: 0.50    Years: 43.00    Pack years: 21.50    Types: Cigarettes  . Smokeless tobacco: Never Used  Substance and Sexual Activity  . Alcohol use: No    Alcohol/week: 0.0 standard drinks  . Drug use: No  . Sexual activity: Never  Other Topics Concern  . Not on file  Social History Narrative   Lives alone.    Social Determinants of Health   Financial Resource Strain:   . Difficulty of Paying Living Expenses: Not on file  Food Insecurity:   . Worried About Charity fundraiser in the Last Year: Not on file  . Ran Out of Food in the Last Year: Not on file  Transportation Needs:   . Lack of Transportation (Medical): Not on file  . Lack of Transportation (Non-Medical): Not on file  Physical Activity:   . Days of Exercise per Week: Not on file  . Minutes of Exercise per Session: Not on file  Stress:   . Feeling of Stress : Not on file  Social Connections:   . Frequency of Communication with Friends and Family: Not on file  . Frequency of Social Gatherings with Friends and Family: Not on file  . Attends Religious Services: Not on file  . Active Member of Clubs or Organizations: Not on file  . Attends Archivist Meetings: Not on file  . Marital Status: Not on file  Intimate Partner Violence:   . Fear of Current or Ex-Partner: Not on file  . Emotionally Abused: Not on file  . Physically Abused: Not on file  . Sexually Abused: Not on file    Review of Systems  Per HPI.  Objective:   Vitals:   12/07/19 1328  BP: 122/79  Pulse: 82  Temp: 98.2 F (36.8 C)  TempSrc: Temporal  SpO2: 98%  Weight: 119 lb 6.4 oz (54.2 kg)     Physical Exam Vitals reviewed.  Constitutional:      Appearance: She is well-developed.  HENT:     Head: Normocephalic and atraumatic.     Ears:      Comments: Minimal clear fluid behind left TM.  Canal clear, no erythema, pinna nontender. Eyes:     Conjunctiva/sclera: Conjunctivae normal.     Pupils: Pupils are equal, round, and reactive to light.  Neck:     Vascular: No carotid bruit.  Cardiovascular:     Rate and  Rhythm: Normal rate and regular rhythm.     Heart sounds: Normal heart sounds.  Pulmonary:     Effort: Pulmonary effort is normal.     Breath sounds: Normal breath sounds.  Abdominal:     Palpations: Abdomen is soft. There is no pulsatile mass.     Tenderness: There is no abdominal tenderness.  Skin:    General: Skin is warm and dry.       Neurological:     Mental Status: She is alert and oriented to person, place, and time.  Psychiatric:        Behavior: Behavior normal.        Assessment & Plan:  MATISYN POST is a 68 y.o. female . Anxiety with depression - Plan: buPROPion (WELLBUTRIN XL) 150 MG 24 hr tablet, PARoxetine (PAXIL) 30 MG tablet, traZODone (DESYREL) 50 MG tablet Primary insomnia - Plan: PARoxetine (PAXIL) 30 MG tablet  -Reports overall stable symptoms with current regimen.  Continue same.  Episodic balance issues discussed as above, initially will treat possible sinus congestion as a contributor/middle ear source but also may need to adjust regimen as above plan on follow-up January 8.  Nevus - Plan: Ambulatory referral to Dermatology  -Referred to previous dermatologist for ongoing evaluation of nevi on back.  Hypothyroidism, unspecified type - Plan: levothyroxine (SYNTHROID) 50 MCG tablet, TSH  -Overall stable symptoms, check TSH, continue same dose of Synthroid for now  Dry skin  -Dry skin care reviewed, handout given.  Check TSH as above  Dizziness - Plan: Comprehensive metabolic panel, CBC Nasal congestion   -Initial approach with treating sinus symptoms with restarting steroid nasal spray.  Plan follow-up in January.  Does report family history of Lewy body dementia, concern for her  dizziness as presenting symptom.  Consider neurology follow-up if persistent symptoms in January, with RTC precautions if acutely worsening.  HSV-2 infection - Plan: valACYclovir (VALTREX) 1000 MG tablet  -Stable with daily Valtrex 500 mg daily.  Option of 500-1000mg  daily for prevention and discussed treatment doses if flare.     Meds ordered this encounter  Medications  . buPROPion (WELLBUTRIN XL) 150 MG 24 hr tablet    Sig: Take 1 tablet (150 mg total) by mouth daily.    Dispense:  90 tablet    Refill:  3  . levothyroxine (SYNTHROID) 50 MCG tablet    Sig: TAKE 1 TABLET (50 MCG TOTAL) BY MOUTH DAILY.    Dispense:  90 tablet    Refill:  3  . PARoxetine (PAXIL) 30 MG tablet    Sig: Take 1 tablet (30 mg total) by mouth at bedtime.    Dispense:  90 tablet    Refill:  3  . traZODone (DESYREL) 50 MG tablet    Sig: Take 1-2 tablets (50-100 mg total) by mouth at bedtime.    Dispense:  180 tablet    Refill:  3  . valACYclovir (VALTREX) 1000 MG tablet    Sig: Take 1 tablet (1,000 mg total) by mouth daily.    Dispense:  90 tablet    Refill:  3   Patient Instructions    Try restarting Nasacort nasal spray to see if that will help with the balance/dizziness as sinus congestion can sometimes cause those symptoms.  Follow-up in 3 weeks to discuss the symptoms further, sooner if any worsening symptoms.  Potentially we may need to review your medications to make sure the combination of meds is not causing the symptoms, or can certainly  have neurology evaluate you as well given your family history.  Again try the Nasacort first to see if that helps. Return to the clinic or go to the nearest emergency room if any of your symptoms worsen or new symptoms occur.  No other change in medications for now.  See information below on dry skin care, but I do like Eucerin, Aveeno, Cetaphil lotion.   I will refer you to dermatology.  I do check some screening blood work today as well.  Thank you for  coming in today, and take care.    Drink at least 64 ounces of water daily. Consider a humidifier for the room where you sleep. Bathe once daily. Avoid using HOT water, as it dries skin.  Avoid deodorant soaps (Dial is the worst!) and stick with gentle cleansers (I like Cetaphil Liquid Cleanser). After bathing, dry off completely, then apply a thick emollient cream (I like Cetaphil Moisturizing Cream). Apply the cream twice daily, or more!    If you have lab work done today you will be contacted with your lab results within the next 2 weeks.  If you have not heard from [place] then please contact us. The fastest way to get your results is to register for My Chart.   IF you received an x-ray today, you will receive an invoice from [facility] Radiology. Please contact [facility] Radiology at [phone] with questions or concerns regarding your invoice.   IF you received labwork today, you will receive an invoice from [place]. Please contact [facility] at [phone] with questions or concerns regarding your invoice.   Our billing staff will not be able to assist you with questions regarding bills from these companies.  You will be contacted with the lab results as soon as they are available. The fastest way to get your results is to activate your My Chart account. Instructions are located on the last page of this paperwork. If you have not heard from [place] regarding the results in 2 weeks, please contact this office.          Signed, [name], MD [facility]

## 2019-12-08 LAB — COMPREHENSIVE METABOLIC PANEL
ALT: 15 IU/L (ref 0–32)
AST: 18 IU/L (ref 0–40)
Albumin/Globulin Ratio: 2 (ref 1.2–2.2)
Albumin: 4.4 g/dL (ref 3.8–4.8)
Alkaline Phosphatase: 77 IU/L (ref 39–117)
BUN/Creatinine Ratio: 26 (ref 12–28)
BUN: 18 mg/dL (ref 8–27)
Bilirubin Total: 0.2 mg/dL (ref 0.0–1.2)
CO2: 24 mmol/L (ref 20–29)
Calcium: 9.4 mg/dL (ref 8.7–10.3)
Chloride: 101 mmol/L (ref 96–106)
Creatinine, Ser: 0.69 mg/dL (ref 0.57–1.00)
GFR calc Af Amer: 102 mL/min/{1.73_m2} (ref >59)
GFR calc non Af Amer: 88 mL/min/{1.73_m2} (ref >59)
Globulin, Total: 2.2 g/dL (ref 1.5–4.5)
Glucose: 92 mg/dL (ref 65–99)
Potassium: 4.4 mmol/L (ref 3.5–5.2)
Sodium: 138 mmol/L (ref 134–144)
Total Protein: 6.6 g/dL (ref 6.0–8.5)

## 2019-12-08 LAB — CBC
Hematocrit: 37.6 % (ref 34.0–46.6)
Hemoglobin: 13.1 g/dL (ref 11.1–15.9)
MCH: 34.2 pg — ABNORMAL HIGH (ref 26.6–33.0)
MCHC: 34.8 g/dL (ref 31.5–35.7)
MCV: 98 fL — ABNORMAL HIGH (ref 79–97)
Platelets: 191 10*3/uL (ref 150–450)
RBC: 3.83 x10E6/uL (ref 3.77–5.28)
RDW: 12.3 % (ref 11.7–15.4)
WBC: 4.3 10*3/uL (ref 3.4–10.8)

## 2019-12-08 LAB — TSH: TSH: 2.25 u[IU]/mL (ref 0.450–4.500)

## 2019-12-29 ENCOUNTER — Ambulatory Visit: Payer: Medicare Other | Admitting: Family Medicine

## 2020-01-03 ENCOUNTER — Other Ambulatory Visit: Payer: Self-pay

## 2020-01-03 ENCOUNTER — Ambulatory Visit (INDEPENDENT_AMBULATORY_CARE_PROVIDER_SITE_OTHER): Payer: Medicare Other | Admitting: Family Medicine

## 2020-01-03 NOTE — Progress Notes (Signed)
11:59 AM Left prior to being seen. (left approx 20 mins after appt time per clinical).

## 2020-01-16 ENCOUNTER — Telehealth: Payer: Self-pay | Admitting: *Deleted

## 2020-01-16 NOTE — Telephone Encounter (Signed)
Schedule AWV.  

## 2020-02-10 ENCOUNTER — Ambulatory Visit: Payer: Medicare Other | Attending: Internal Medicine

## 2020-02-10 DIAGNOSIS — Z23 Encounter for immunization: Secondary | ICD-10-CM | POA: Insufficient documentation

## 2020-02-10 NOTE — Progress Notes (Signed)
   Covid-19 Vaccination Clinic  Name:  KIERSTYN YEUNG    MRN: [id number] DOB: September 30, 1951  02/10/2020  Ms. Iadarola was observed post Covid-19 immunization for 15 minutes without incidence. She was provided with Vaccine Information Sheet and instruction to access the V-Safe system.   Ms. Montigny was instructed to call 911 with any severe reactions post vaccine: Marland Kitchen Difficulty breathing  . Swelling of your face and throat  . A fast heartbeat  . A bad rash all over your body  . Dizziness and weakness    Immunizations Administered    Name Date Dose VIS Date Route   Pfizer COVID-19 Vaccine 02/10/2020 11:43 AM 0.3 mL 12/01/2019 Intramuscular   Manufacturer: [place]   Lot: [id number]   [place]: [id number]

## 2020-03-05 ENCOUNTER — Ambulatory Visit: Payer: Medicare Other | Attending: Internal Medicine

## 2020-03-05 DIAGNOSIS — Z23 Encounter for immunization: Secondary | ICD-10-CM

## 2020-03-05 NOTE — Progress Notes (Signed)
   Covid-19 Vaccination Clinic  Name:  PILAR SHASTEEN    MRN: [id number] DOB: June 28, 1951  03/05/2020  Ms. Turrell was observed post Covid-19 immunization for 15 minutes without incident. She was provided with Vaccine Information Sheet and instruction to access the V-Safe system.   Ms. Luyster was instructed to call 911 with any severe reactions post vaccine: Marland Kitchen Difficulty breathing  . Swelling of face and throat  . A fast heartbeat  . A bad rash all over body  . Dizziness and weakness   Immunizations Administered    Name Date Dose VIS Date Route   Pfizer COVID-19 Vaccine 03/05/2020 11:42 AM 0.3 mL 12/01/2019 Intramuscular   Manufacturer: [place]   Lot: [id number]   [place]: [id number]

## 2020-07-18 ENCOUNTER — Ambulatory Visit (HOSPITAL_COMMUNITY)
Admission: EM | Admit: 2020-07-18 | Discharge: 2020-07-18 | Disposition: A | Discharge status: Home / Self Care | Payer: Medicare Other | Attending: Family Medicine | Admitting: Family Medicine

## 2020-07-18 ENCOUNTER — Other Ambulatory Visit: Payer: Self-pay

## 2020-07-18 ENCOUNTER — Encounter (HOSPITAL_COMMUNITY): Payer: Self-pay | Admitting: Emergency Medicine

## 2020-07-18 DIAGNOSIS — S81002A Unspecified open wound, left knee, initial encounter: Secondary | ICD-10-CM

## 2020-07-18 DIAGNOSIS — S81802A Unspecified open wound, left lower leg, initial encounter: Secondary | ICD-10-CM

## 2020-07-18 DIAGNOSIS — S91002A Unspecified open wound, left ankle, initial encounter: Secondary | ICD-10-CM | POA: Diagnosis not present

## 2020-07-18 DIAGNOSIS — W540XXA Bitten by dog, initial encounter: Secondary | ICD-10-CM

## 2020-07-18 MED ORDER — AMOXICILLIN-POT CLAVULANATE 875-125 MG PO TABS: 1.0000 | ORAL_TABLET | Freq: Two times a day (BID) | ORAL | 0 refills | Status: DC

## 2020-07-18 NOTE — ED Triage Notes (Addendum)
Patient's dog bit her, dog shots are current.  Patient has record with her.   Patient has a dog bite to left lower leg.  Swelling, bruising, bleeding controlled.   Has used witch hazel, ice and crushed an aleve tablet and rubbed on wound

## 2020-07-18 NOTE — ED Provider Notes (Signed)
[facility]    CSN: [id number] Arrival date & time: 07/18/20  0940      History   Chief Complaint Chief Complaint  Patient presents with  . Animal Bite    HPI Kristine Davidson is a 69 y.o. female.   HPI  Patient has a dog bite on her left leg.  This was her own dog.  Shots are up-to-date.  He was irritated by men outside the home and barking. Wound is been cleaned.  Patient is worried about infection.  Past Medical History:  Diagnosis Date  . Anemia   . B12 deficiency 03/31/2012  . Depression   . Diverticular disease   . HSV-2 (herpes simplex virus 2) infection   . Hyperlipidemia   . Hypertension   . Mitral valve prolapse   . Osteoporosis 01/14/2018   DEXA scan at [facility] 01/14/18 T score -2.8 at Rt femur neck and L1-L4 AP spine  . Substance abuse ([place])   . Thyroid disease     Patient Active Problem List   Diagnosis Date Noted  . Osteoporosis 01/14/2018  . Anemia 10/29/2016  . Depression with anxiety 02/28/2015  . HSV-2 (herpes simplex virus 2) infection 03/31/2012  . Vitamin D deficiency disease 03/31/2012  . H/O diverticulitis of colon 02/17/2012  . Hypothyroid 02/17/2012    Past Surgical History:  Procedure Laterality Date  . D and C    . TONSILLECTOMY    . VARICOSE VEIN SURGERY  2000    OB History   No obstetric history on file.      Home Medications    Prior to Admission medications   Medication Sig Start Date End Date Taking? Authorizing Provider  buPROPion (WELLBUTRIN XL) 150 MG 24 hr tablet Take 1 tablet (150 mg total) by mouth daily. 12/07/19  Yes Wendie Agreste, MD  levothyroxine (SYNTHROID) 50 MCG tablet TAKE 1 TABLET (50 MCG TOTAL) BY MOUTH DAILY. 12/07/19  Yes Wendie Agreste, MD  PARoxetine (PAXIL) 30 MG tablet Take 1 tablet (30 mg total) by mouth at bedtime. 12/07/19  Yes Wendie Agreste, MD  traZODone (DESYREL) 50 MG tablet Take 1-2 tablets (50-100 mg total) by mouth at bedtime. 12/07/19  Yes Wendie Agreste,  MD  triamcinolone (NASACORT) 55 MCG/ACT AERO nasal inhaler Place 2 sprays into the nose daily. 10/29/18  Yes Shawnee Knapp, MD  valACYclovir (VALTREX) 1000 MG tablet Take 1 tablet (1,000 mg total) by mouth daily. 12/07/19  Yes Wendie Agreste, MD  amoxicillin-clavulanate (AUGMENTIN) 875-125 MG tablet Take 1 tablet by mouth every 12 (twelve) hours. 07/18/20   Raylene Everts, MD    Family History Family History  Problem Relation Age of Onset  . Cancer Mother   . Stroke Mother   . Macular degeneration Mother   . Breast cancer Mother   . Parkinson's disease Father   . Parkinson's disease Sister   . Diabetes Sister     Social History Social History   Tobacco Use  . Smoking status: Current Every Day Smoker    Packs/day: 0.50    Years: 43.00    Pack years: 21.50    Types: Cigarettes  . Smokeless tobacco: Never Used  Vaping Use  . Vaping Use: Every day  Substance Use Topics  . Alcohol use: No    Alcohol/week: 0.0 standard drinks  . Drug use: No     Allergies   Codeine   Review of Systems Review of Systems See HPI  Physical  Exam Triage Vital Signs ED Triage Vitals  Enc Vitals Group     BP 07/18/20 1057 (!) 117/93     Pulse --      Resp 07/18/20 1057 18     Temp 07/18/20 1057 98.6 F (37 C)     Temp Source 07/18/20 1057 Oral     SpO2 07/18/20 1057 96 %     Weight --      Height --      Head Circumference --      Peak Flow --      Pain Score 07/18/20 1053 2     Pain Loc --      Pain Edu? --      Excl. in [place]? --    No data found.  Updated Vital Signs BP (!) 117/93 (BP Location: Left Arm)   Temp 98.6 F (37 C) (Oral)   Resp 18   SpO2 96%     Physical Exam Constitutional:      General: She is not in acute distress.    Appearance: She is well-developed and normal weight.  HENT:     Head: Normocephalic and atraumatic.  Eyes:     Conjunctiva/sclera: Conjunctivae normal.     Pupils: Pupils are equal, round, and reactive to light.  Cardiovascular:      Rate and Rhythm: Normal rate.  Pulmonary:     Effort: Pulmonary effort is normal. No respiratory distress.  Abdominal:     General: There is no distension.     Palpations: Abdomen is soft.  Musculoskeletal:        General: Normal range of motion.     Cervical back: Normal range of motion.  Skin:    General: Skin is warm and dry.     Comments: Left anterior leg, lower, has multiple superficial abrasions.  No deep punctures noted.  There is bruising in the area.  Wound area measures 4 cm across  Neurological:     Mental Status: She is alert.  Psychiatric:        Mood and Affect: Mood normal.        Behavior: Behavior normal.      UC Treatments / Results  Labs (all labs ordered are listed, but only abnormal results are displayed) Labs Reviewed - No data to display  EKG   Radiology No results found.  Procedures Procedures (including critical care time)  Medications Ordered in UC Medications - No data to display  Initial Impression / Assessment and Plan / UC Course  I have reviewed the triage vital signs and the nursing notes.  Pertinent labs & imaging results that were available during my care of the patient were reviewed by me and considered in my medical decision making (see chart for details).     I reviewed with the patient that the wound is not infected.  There is a likelihood that it could become infected, but dog bites often do not.  Patient is insistent that she would rather take an antibiotic than check the wound daily and worry about becoming infected Final Clinical Impressions(s) / UC Diagnoses   Final diagnoses:  Open wound of left knee, leg, and ankle with complication, initial encounter  Dog bite, initial encounter     Discharge Instructions     Clean twice a day and apply antibiotic ointment Take antibiotic for infection Return as needed   ED Prescriptions    Medication Sig Dispense Auth. Provider   amoxicillin-clavulanate (AUGMENTIN) 875-125  MG tablet  Take 1 tablet by mouth every 12 (twelve) hours. 14 tablet Raylene Everts, MD     PDMP not reviewed this encounter.   Raylene Everts, MD 07/18/20 [phone]

## 2020-07-18 NOTE — Discharge Instructions (Addendum)
Clean twice a day and apply antibiotic ointment Take antibiotic for infection Return as needed

## 2020-07-23 ENCOUNTER — Encounter: Payer: Self-pay | Admitting: Family Medicine

## 2020-07-23 ENCOUNTER — Encounter: Payer: Medicare Other | Admitting: Family Medicine

## 2020-07-23 ENCOUNTER — Telehealth: Payer: Self-pay | Admitting: Family Medicine

## 2020-07-23 DIAGNOSIS — I341 Nonrheumatic mitral (valve) prolapse: Secondary | ICD-10-CM | POA: Insufficient documentation

## 2020-07-25 NOTE — Progress Notes (Signed)
This encounter was created in error - please disregard.

## 2020-08-05 ENCOUNTER — Other Ambulatory Visit: Payer: Self-pay

## 2020-08-05 ENCOUNTER — Encounter: Payer: Self-pay | Admitting: Family Medicine

## 2020-08-05 ENCOUNTER — Telehealth (INDEPENDENT_AMBULATORY_CARE_PROVIDER_SITE_OTHER): Payer: Medicare Other | Admitting: Family Medicine

## 2020-08-05 VITALS — BP 134/87 | HR 73 | Ht 66.0 in | Wt 114.0 lb

## 2020-08-05 DIAGNOSIS — W540XXA Bitten by dog, initial encounter: Secondary | ICD-10-CM

## 2020-08-05 DIAGNOSIS — S81801A Unspecified open wound, right lower leg, initial encounter: Secondary | ICD-10-CM | POA: Diagnosis not present

## 2020-08-05 NOTE — Patient Instructions (Signed)
° ° ° °  If you have lab work done today you will be contacted with your lab results within the next 2 weeks.  If you have not heard from us then please contact us. The fastest way to get your results is to register for My Chart. ° ° °IF you received an x-ray today, you will receive an invoice from [facility]. Please contact [facility] at [phone] with questions or concerns regarding your invoice.  ° °IF you received labwork today, you will receive an invoice from [facility]. Please contact [facility] at [phone] with questions or concerns regarding your invoice.  ° °Our billing staff will not be able to assist you with questions regarding bills from these companies. ° °You will be contacted with the lab results as soon as they are available. The fastest way to get your results is to activate your My Chart account. Instructions are located on the last page of this paperwork. If you have not heard from us regarding the results in 2 weeks, please contact this office. °  ° ° ° °

## 2020-08-05 NOTE — Progress Notes (Signed)
Virtual Visit via Video Note   5:58 PM - left message, unable to reach, will call back.  5:58 PM - no answer- will call closer to her appt time.    I connected with Kristine Davidson on 08/05/20 at 5:58 PM by a video enabled telemedicine application and verified that I am speaking with the correct person using two identifiers.  Patient location:home.  My location: office   Initial 12 min chart review.  I discussed the limitations, risks, security and privacy concerns of performing an evaluation and management service by telephone and the availability of in person appointments. I also discussed with the patient that there may be a patient responsible charge related to this service. The patient expressed understanding and agreed to proceed, consent obtained  Chief complaint:  Chief Complaint  Patient presents with  . Referral    Pt was bit by a dog on 0726/2021. pt went to urgent care on 07/18/2020 pt was put on agminten for 7 days. pt was concered that the wound was infected. Dr.Santiago informed the pt to go to urgent care after seeing pictures of wound viva my chart. Pt went to an urgent care was p[ut on anothe antibiotic. pt saw [facility] where they sudgested a debridment. pt would like a referral toe dermatology to have that done.      History of Present Illness: Kristine Davidson is a 69 y.o. female  Evaluation of dog bite, left lower extremity: DOI 07/17/20  most recently evaluated by telemedicine visit August 14 with [facility], virtual appointment, notes reviewed:  July 29, [facility] urgent care.  Bite was from her own dog, shots are up-to-date.  Wound was cleaned at home.  Left anterior leg with multiple superficial abrasions, no apparent deep punctures were noted, but bruising in the area.  Wound area 4 cm across.  Started Augmentin 875 mg twice daily for 7 days  August 3rd, [facility] urgent care for increasing redness and darkening areas with some pus.   No fever or chills.  She was using mupirocin at that time.  Multiple puncture wounds noted with some purulent discharge and darkened areas, erythema and some oozing/drainage without vascular streaking.  Started on clindamycin 300 mg 3 times daily for 10 days, Bactroban ointment refilled for twice per day.  Telemedicine appointment August 14, virtual with [facility]: Still having tenderness around the puncture wounds, afebrile, has been cleaning with warm soapy water.  Tightness, full feeling around puncture wounds, small amount of drainage.  No exudate or drainage noted from video exam but faint area of erythema noted.  Extending clindamycin 300 mg 3 times daily, and refer to dermatology.  Plan to see Derm within a few days?   Her dermatologist is [facility].  No availablity locally.   No diarrhea, some frequent BM's.  Leg feels a little better past 2 days.  Soaking in tub, washing with soap, warm compress.  Leaking fluid around scab. Pressing with washcloth. Mupirocin ointment 3 times per day.  Still some firm area, but getting better.covering with nonstick No fever. Temp 98.4. Redness lessening.        Patient Active Problem List   Diagnosis Date Noted  . Mitral valve prolapse 07/23/2020  . Osteoporosis 01/14/2018  . Anemia 10/29/2016  . Depression with anxiety 02/28/2015  . Hypothyroidism 05/17/2014  . HSV-2 (herpes simplex virus 2) infection 03/31/2012  . Vitamin D deficiency disease 03/31/2012  . H/O diverticulitis of colon 02/17/2012  .  Hypothyroid 02/17/2012   Past Medical History:  Diagnosis Date  . Anemia   . B12 deficiency 03/31/2012  . Depression   . Diverticular disease   . HSV-2 (herpes simplex virus 2) infection   . Hyperlipidemia   . Hypertension   . Mitral valve prolapse   . Osteoporosis 01/14/2018   DEXA scan at [facility] 01/14/18 T score -2.8 at Rt femur neck and L1-L4 AP spine  . Substance abuse ([place])   . Thyroid disease    Past  Surgical History:  Procedure Laterality Date  . D and C    . TONSILLECTOMY    . VARICOSE VEIN SURGERY  2000   Allergies  Allergen Reactions  . Codeine Itching   Prior to Admission medications   Medication Sig Start Date End Date Taking? Authorizing Provider  amoxicillin-clavulanate (AUGMENTIN) 875-125 MG tablet Take 1 tablet by mouth every 12 (twelve) hours. 07/18/20   Raylene Everts, MD  buPROPion (WELLBUTRIN XL) 150 MG 24 hr tablet Take 1 tablet (150 mg total) by mouth daily. 12/07/19   Wendie Agreste, MD  levothyroxine (SYNTHROID) 50 MCG tablet TAKE 1 TABLET (50 MCG TOTAL) BY MOUTH DAILY. 12/07/19   Wendie Agreste, MD  PARoxetine (PAXIL) 30 MG tablet Take 1 tablet (30 mg total) by mouth at bedtime. 12/07/19   Wendie Agreste, MD  traZODone (DESYREL) 50 MG tablet Take 1-2 tablets (50-100 mg total) by mouth at bedtime. 12/07/19   Wendie Agreste, MD  triamcinolone (NASACORT) 55 MCG/ACT AERO nasal inhaler Place 2 sprays into the nose daily. 10/29/18   Shawnee Knapp, MD  valACYclovir (VALTREX) 1000 MG tablet Take 1 tablet (1,000 mg total) by mouth daily. 12/07/19   Wendie Agreste, MD   Social History   Socioeconomic History  . Marital status: Divorced    Spouse name: Not on file  . Number of children: 0  . Years of education: Not on file  . Highest education level: Professional school degree (e.g., MD, DDS, DVM, JD)  Occupational History  . 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 Smoking status: Current Every Day Smoker    Packs/day: 0.50    Years: 43.00    Pack years: 21.50    Types: Cigarettes  . Smokeless tobacco: Never Used  Vaping Use  . Vaping Use: Every day  Substance and Sexual Activity  . Alcohol use: No    Alcohol/week: 0.0 standard drinks  . Drug use: No  . Sexual activity: Never  Other Topics Concern  . Not on file  Social History Narrative   Lives alone.    Social Determinants  of Health   Financial Resource Strain:   . Difficulty of Paying Living Expenses:   Food Insecurity:   . Worried About Charity fundraiser in the Last Year:   . Arboriculturist in the Last Year:   Transportation Needs:   . Film/video editor (Medical):   Marland Kitchen Lack of Transportation (Non-Medical):   Physical Activity:   . Days of Exercise per Week:   . Minutes of Exercise per Session:   Stress:   . Feeling of Stress :   Social Connections:   . Frequency of Communication with Friends and Family:   . Frequency of Social Gatherings with Friends and Family:   . Attends Religious Services:   . Active Member of Clubs or Organizations:   . Attends  Club or Organization Meetings:   Marland Kitchen Marital Status:   Intimate Partner Violence:   . Fear of Current or Ex-Partner:   . Emotionally Abused:   Marland Kitchen Physically Abused:   . Sexually Abused:     Observations/Objective: Vitals:   08/05/20 1314  BP: 134/87  Pulse: 73  Weight: 114 lb (51.7 kg)  Height: 5\' 6"  (1.676 m)    Nontoxic-appearing, appropriate responses, large puncture wound on video as above.  Assessment and Plan: Wound of right lower extremity, initial encounter - Plan: Ambulatory referral to General Surgery  Dog bite, initial encounter Persistent large open wound after initial puncture wound, animal bite, now on third course of antibiotics.  Concern for deep space involvement.  We will try to have her evaluated by general surgeon tomorrow.  Continue mupirocin, clindamycin for now with cleansing and warm compresses.  ER precautions  Follow Up Instructions: Surgery, or ER if worse.    I discussed the assessment and treatment plan with the patient. The patient was provided an opportunity to ask questions and all were answered. The patient agreed with the plan and demonstrated an understanding of the instructions.   The patient was advised to call back or seek an in-person evaluation if the symptoms worsen or if the condition fails  to improve as anticipated.  I provided 74minutes of non-face-to-face time during this encounter.   Wendie Agreste, MD

## 2020-08-08 ENCOUNTER — Encounter: Payer: Self-pay | Admitting: Internal Medicine

## 2020-08-08 ENCOUNTER — Ambulatory Visit (INDEPENDENT_AMBULATORY_CARE_PROVIDER_SITE_OTHER): Payer: Medicare Other | Admitting: Internal Medicine

## 2020-08-08 ENCOUNTER — Other Ambulatory Visit: Payer: Self-pay

## 2020-08-08 VITALS — BP 129/82 | HR 91 | Temp 98.4°F | Ht 66.0 in | Wt 115.6 lb

## 2020-08-08 DIAGNOSIS — T148XXA Other injury of unspecified body region, initial encounter: Secondary | ICD-10-CM

## 2020-08-08 DIAGNOSIS — S81852A Open bite, left lower leg, initial encounter: Secondary | ICD-10-CM | POA: Diagnosis not present

## 2020-08-08 DIAGNOSIS — L089 Local infection of the skin and subcutaneous tissue, unspecified: Secondary | ICD-10-CM

## 2020-08-08 DIAGNOSIS — W540XXA Bitten by dog, initial encounter: Secondary | ICD-10-CM | POA: Diagnosis not present

## 2020-08-08 MED ORDER — SULFAMETHOXAZOLE-TRIMETHOPRIM 800-160 MG PO TABS: 1.0000 | ORAL_TABLET | Freq: Two times a day (BID) | ORAL | 0 refills | Status: AC

## 2020-08-08 NOTE — Patient Instructions (Signed)
Kristine Davidson It was a pleasure meeting you today.  Please follow the instructions below for your wound care  1) silver alginate dressing changes daily 2) followed by a band aid   Please start taking bactrim for the next 5 days  Please follow up with me next week.  Call us at [phone] with any questions or concerns

## 2020-08-08 NOTE — Progress Notes (Signed)
Subjective:     Patient ID: Kristine Davidson, female    DOB: [birth year], 69 y.o.   MRN: [id number]  Chief Complaint  Patient presents with   Consult left leg wound due to dog bite    HPI: The patient is a 69 y.o. female here for left lower extremity wound due to dog bite  On 7/29 patient experienced a dog bite to her left leg.  She visited the ED the following day after the incident and was prescribed Augmentin for 7 days and was recommended to use antibiotic ointment.  At that time the wound did not look infected.    On 8/3 she visited the urgent care for wound infection.  At that time she was prescribed clindamycin to take for 10 days.  On 8/14 she had a telemedicine visit with [facility] emergency medicine clinic.  It was recommended she continue clindamycin for 10 more days.  She followed up with her PCP on 8/16 and was referred to general surgery for concern of deep tissue infection.  Patient was referred to our office for wound care prior to general surgery evaluation.  Patient has daily pictures of the wound starting from the beginning of August.  She reports improvement in the wound appearance and size over the course of the last 3 weeks.  She is concerned however that the wound does wax and wane in appearance and drainage.  Currently there is no purulent drainage, acute pain or increased warmth or erythema to the leg.  She denies fever or chills.   Review of Systems  All other systems reviewed and are negative.    has a past medical history of Anemia, B12 deficiency (03/31/2012), Depression, Diverticular disease, HSV-2 (herpes simplex virus 2) infection, Hyperlipidemia, Hypertension, Mitral valve prolapse, Osteoporosis (01/14/2018), Substance abuse ([place]), and Thyroid disease.  has a past surgical history that includes Tonsillectomy; D and C; and Varicose vein surgery (2000).  reports that she has been smoking cigarettes. She has a 21.50 pack-year smoking history. She has never  used smokeless tobacco. Objective:   Vital Signs BP 129/82 (BP Location: Left Arm, Patient Position: Sitting, Cuff Size: Normal)    Pulse 91    Temp 98.4 F (36.9 C) (Oral)    Ht 5\' 6"  (1.676 m)    Wt 115 lb 9.6 oz (52.4 kg)    SpO2 94%    BMI 18.66 kg/m  Vital Signs and Nursing Note Reviewed Physical Exam Skin:    Comments: Left leg wound measures: 2.0 x 0 1.3 x 0.2 cm No induration or fluctuance noted No drainage noted Minimal tenderness to palpation Mild erythema to the periwound        Pre-debridement                                        Post-debridement  Assessment/Plan:     ICD-10-CM   1. Dog bite of left lower leg, initial encounter  B35.329J    W54.0XXA   2. Wound infection  T14.8XXA    L08.9    Assessment: Left lower extremity dog bite complicated by cellulitis  Today the wound appears healing.  She is currently on clindamycin and has 6 days remaining.  I would like to add Bactrim to cover for Pasteurella multocida.  I will do this for 5 days.  No obvious signs of infection today but due to waxing and waning  I think it would be reasonable to add the additional antibiotic and finish out her course of clindamycin.  Exam was reassuring for no underlying abscess or deep tissue infection.   I do not think she needs additional imaging at this time.  There was devitalized tissue in the wound bed and this was debrided with curette, pickups and scissors.  Post debridement there was good granulation tissue throughout.  Based on the patient's pictures that I reviewed the wound is progressing in the right direction.  I will have close follow-up with her on Monday of next week.   She is having some drainage so I recommended a silver alginate dressing to change daily and sample was given to her in office.  If all looks well on Monday I may use a Tegaderm silicone foam border to keep in place for the next 5 to 7 days.  Plan -Bactrim for 5 days -Silver alginate dressing change  daily -In office sharp debridement -In office silver alginate dressing placed -Follow-up next week  I spent 45 minutes face to face with the patient. Greater than 50% of the time was spent on counseling and coordination of care, which is detailed in the A&P.    Boyd Kerbs, DO 08/08/2020, 11:03 AM

## 2020-08-12 ENCOUNTER — Encounter: Payer: Self-pay | Admitting: Internal Medicine

## 2020-08-12 ENCOUNTER — Other Ambulatory Visit: Payer: Self-pay

## 2020-08-12 ENCOUNTER — Ambulatory Visit (INDEPENDENT_AMBULATORY_CARE_PROVIDER_SITE_OTHER): Payer: Medicare Other | Admitting: Internal Medicine

## 2020-08-12 VITALS — BP 104/68 | HR 82 | Temp 98.8°F

## 2020-08-12 DIAGNOSIS — W540XXD Bitten by dog, subsequent encounter: Secondary | ICD-10-CM | POA: Diagnosis not present

## 2020-08-12 DIAGNOSIS — S81852D Open bite, left lower leg, subsequent encounter: Secondary | ICD-10-CM

## 2020-08-12 DIAGNOSIS — T148XXA Other injury of unspecified body region, initial encounter: Secondary | ICD-10-CM | POA: Diagnosis not present

## 2020-08-12 DIAGNOSIS — L089 Local infection of the skin and subcutaneous tissue, unspecified: Secondary | ICD-10-CM

## 2020-08-12 NOTE — Progress Notes (Signed)
   Subjective:     Patient ID: Kristine Davidson, female    DOB: 1951/04/03, 69 y.o.   MRN: [id number]  Chief Complaint  Patient presents with  . Follow-up left lower extremity wound from dog bite    HPI: The patient is a 69 y.o. female here for follow-up of left lower extremity wound due to dog bite.  Patient states that she has been doing silver alginate dressings daily and keeping the wound covered with a Band-Aid. She states that the wound is stable. She was also able to start Bactrim and has 1 day left. She has 2 days left of clindamycin. She denies fever/chills, purulent drainage, acute pain or increased warmth or erythema to the leg. She is overall in better spirits.  Review of Systems  All other systems reviewed and are negative.    has a past medical history of Anemia, B12 deficiency (03/31/2012), Depression, Diverticular disease, HSV-2 (herpes simplex virus 2) infection, Hyperlipidemia, Hypertension, Mitral valve prolapse, Osteoporosis (01/14/2018), Substance abuse ([place]), and Thyroid disease.  has a past surgical history that includes Tonsillectomy; D and C; and Varicose vein surgery (2000).  reports that she has been smoking cigarettes. She has a 21.50 pack-year smoking history. She has never used smokeless tobacco. Objective:   Vital Signs BP 104/68 (BP Location: Left Arm, Patient Position: Sitting, Cuff Size: Normal)   Pulse 82   Temp 98.8 F (37.1 C) (Oral)   SpO2 95%  Vital Signs and Nursing Note Reviewed Physical Exam Skin:    Comments: Left lower extremity wound measures: 1.5 x 1.0 x 0.2 cm No induration or fluctuance noted No drainage noted Minimal tenderness to palpation       Assessment/Plan:     ICD-10-CM   1. Dog bite of left lower leg, subsequent encounter  S81.852D    W54.0XXD   2. Wound infection  T14.8XXA    L08.9    Assessment: Left lower extremity dog bite complicated by cellulitis  Today the wound does not look infected and has shown  improvement over the course of the week with the use of silver alginate dressings. I recommended she finish her antibiotic course. At this time I would like to use a Tegaderm silicone foam border dressing that can be kept in place for the next 7 days. I told the patient that if it were to come off then she can go back to silver alginate dressings and a Band-Aid daily. I will see her for follow-up in 1 week.  Plan -Finish antibiotic course -In office Tegaderm silicone form border placed -Follow-up in 1 week -If dressing comes off before 1 week can go back to silver alginate dressing daily with Blanchie Dessert, DO 08/12/2020, 10:54 AM

## 2020-08-14 ENCOUNTER — Institutional Professional Consult (permissible substitution): Payer: Medicare Other | Admitting: Internal Medicine

## 2020-08-19 ENCOUNTER — Encounter: Payer: Self-pay | Admitting: Internal Medicine

## 2020-08-19 ENCOUNTER — Ambulatory Visit (INDEPENDENT_AMBULATORY_CARE_PROVIDER_SITE_OTHER): Payer: Medicare Other | Admitting: Internal Medicine

## 2020-08-19 ENCOUNTER — Other Ambulatory Visit: Payer: Self-pay

## 2020-08-19 VITALS — BP 114/78 | HR 82 | Temp 98.3°F

## 2020-08-19 DIAGNOSIS — S81852D Open bite, left lower leg, subsequent encounter: Secondary | ICD-10-CM | POA: Diagnosis not present

## 2020-08-19 DIAGNOSIS — W540XXD Bitten by dog, subsequent encounter: Secondary | ICD-10-CM | POA: Diagnosis not present

## 2020-08-19 DIAGNOSIS — T148XXA Other injury of unspecified body region, initial encounter: Secondary | ICD-10-CM | POA: Diagnosis not present

## 2020-08-19 DIAGNOSIS — L089 Local infection of the skin and subcutaneous tissue, unspecified: Secondary | ICD-10-CM | POA: Diagnosis not present

## 2020-08-19 NOTE — Progress Notes (Signed)
° °  Subjective:     Patient ID: Kristine Davidson, female    DOB: [birth year], 69 y.o.   MRN: [id number]   HPI: The patient is a 69 y.o. female here for follow-up of left lower extremity wound due to dog bite.  Patient states that she was able to keep the Tegaderm silicone foam dressing in place until Saturday.  She subsequently used silver alginate for dressing changes.  She is unable to tell if there is improvement to the wound.  She currently denies any pain, purulent drainage, increased warmth or erythema to the wound.  Review of Systems  All other systems reviewed and are negative.    has a past medical history of Anemia, B12 deficiency (03/31/2012), Depression, Diverticular disease, HSV-2 (herpes simplex virus 2) infection, Hyperlipidemia, Hypertension, Mitral valve prolapse, Osteoporosis (01/14/2018), Substance abuse ([facility]), and Thyroid disease.  has a past surgical history that includes Tonsillectomy; D and C; and Varicose vein surgery (2000).  reports that she has been smoking cigarettes. She has a 21.50 pack-year smoking history. She has never used smokeless tobacco. Objective:   Vital Signs BP 114/78 (BP Location: Left Arm, Patient Position: Sitting, Cuff Size: Normal)    Pulse 82    Temp 98.3 F (36.8 C) (Oral)    SpO2 98%  Vital Signs and Nursing Note Reviewed Physical Exam Skin:    Comments: Left lower extremity wound measures: 1.2 x 0.8 x 0.1 cm Granulation tissue throughout the wound bed No induration or fluctuance noted No drainage noted Minimal tenderness to palpation        Assessment/Plan:     ICD-10-CM   1. Dog bite of left lower leg, subsequent encounter  S81.852D    W54.0XXD   2. Wound infection  T14.8XXA    L08.9    Assessment: Left lower extremity dog bite complicated by cellulitis  The wound appears well-healing with no signs of infection today.  I recommended she continue using silver alginate daily dressing changes.  Overall I am pleased with the wound  healing at this point and I am hoping it will be closed in 2 weeks.  She would like to follow-up in 1 week.  Plan -Silver alginate dressing changes daily covered with a Band-Aid -In office dressing change with Vaseline and Band-Aid -Follow-up in 1 week  Boyd Kerbs, DO 08/19/2020, 11:32 AM

## 2020-08-28 ENCOUNTER — Ambulatory Visit: Payer: Medicare Other | Admitting: Internal Medicine

## 2020-09-04 ENCOUNTER — Encounter: Payer: Self-pay | Admitting: Internal Medicine

## 2020-09-04 ENCOUNTER — Other Ambulatory Visit: Payer: Self-pay

## 2020-09-04 ENCOUNTER — Ambulatory Visit (INDEPENDENT_AMBULATORY_CARE_PROVIDER_SITE_OTHER): Payer: Medicare Other | Admitting: Internal Medicine

## 2020-09-04 VITALS — BP 112/75 | HR 70 | Temp 98.2°F

## 2020-09-04 DIAGNOSIS — W540XXD Bitten by dog, subsequent encounter: Secondary | ICD-10-CM | POA: Diagnosis not present

## 2020-09-04 DIAGNOSIS — S81852D Open bite, left lower leg, subsequent encounter: Secondary | ICD-10-CM

## 2020-09-04 NOTE — Progress Notes (Signed)
   Subjective:     Patient ID: Kristine Davidson, female    DOB: 1950-12-23, 69 y.o.   MRN: [id number]  Chief Complaint  Patient presents with  . Follow-up of left lower extremity wound.    HPI: The patient is a 69 y.o. female here for follow-up ofleft lower extremity wound due to dog bite.  Patient states she has been using silver alginate dressing daily.  She reports that the wound closed up about 2 days ago.  She is overall happy with the wound healing process.  She denies any pain, purulent drainage or increased warmth or erythema to the wound site.  Review of Systems  All other systems reviewed and are negative.    has a past medical history of Anemia, B12 deficiency (03/31/2012), Depression, Diverticular disease, HSV-2 (herpes simplex virus 2) infection, Hyperlipidemia, Hypertension, Mitral valve prolapse, Osteoporosis (01/14/2018), Substance abuse ([place]), and Thyroid disease.  has a past surgical history that includes Tonsillectomy; D and C; and Varicose vein surgery (2000).  reports that she has been smoking cigarettes. She has a 21.50 pack-year smoking history. She has never used smokeless tobacco. Objective:   Vital Signs BP 112/75 (BP Location: Left Arm, Patient Position: Sitting, Cuff Size: Normal)   Pulse 70   Temp 98.2 F (36.8 C) (Oral)   SpO2 97%  Vital Signs and Nursing Note Reviewed Physical Exam Skin:    Comments: Epithelialized tissue to previous wound site No induration or fluctuance noted No drainage noted No tenderness to palpation          Assessment/Plan:     ICD-10-CM   1. Dog bite of left lower leg, subsequent encounter  S81.852D    W54.0XXD    The wound is closed and epithelialized.  No signs of infection.  I am overall pleased with the wound healing. Patient may follow-up as needed.  We discussed using a silicone-based cream for scar healing.  Plan -Mederma -Follow-up as needed  Boyd Kerbs, DO 09/04/2020, 11:38 AM

## 2020-09-19 ENCOUNTER — Other Ambulatory Visit: Payer: Self-pay | Admitting: Internal Medicine

## 2020-09-19 DIAGNOSIS — Z720 Tobacco use: Secondary | ICD-10-CM

## 2020-09-23 ENCOUNTER — Ambulatory Visit (HOSPITAL_COMMUNITY)
Admission: RE | Admit: 2020-09-23 | Discharge: 2020-09-23 | Disposition: A | Discharge status: Home / Self Care | Payer: Medicare Other | Source: Ambulatory Visit | Attending: Internal Medicine | Admitting: Internal Medicine

## 2020-09-23 ENCOUNTER — Other Ambulatory Visit: Payer: Self-pay

## 2020-09-23 ENCOUNTER — Other Ambulatory Visit (HOSPITAL_COMMUNITY): Payer: Self-pay | Admitting: Internal Medicine

## 2020-09-23 DIAGNOSIS — R0989 Other specified symptoms and signs involving the circulatory and respiratory systems: Secondary | ICD-10-CM | POA: Diagnosis not present

## 2020-10-02 ENCOUNTER — Ambulatory Visit
Admission: RE | Admit: 2020-10-02 | Discharge: 2020-10-02 | Disposition: A | Discharge status: Home / Self Care | Payer: Medicare Other | Source: Ambulatory Visit | Attending: Internal Medicine | Admitting: Internal Medicine

## 2020-10-02 ENCOUNTER — Other Ambulatory Visit: Payer: Self-pay

## 2020-10-02 DIAGNOSIS — Z720 Tobacco use: Secondary | ICD-10-CM

## 2020-11-12 ENCOUNTER — Other Ambulatory Visit: Payer: Self-pay | Admitting: Internal Medicine

## 2020-11-12 DIAGNOSIS — Z1231 Encounter for screening mammogram for malignant neoplasm of breast: Secondary | ICD-10-CM

## 2020-11-26 ENCOUNTER — Other Ambulatory Visit: Payer: Self-pay | Admitting: Internal Medicine

## 2020-11-26 DIAGNOSIS — M810 Age-related osteoporosis without current pathological fracture: Secondary | ICD-10-CM

## 2021-01-08 DIAGNOSIS — M810 Age-related osteoporosis without current pathological fracture: Secondary | ICD-10-CM | POA: Diagnosis not present

## 2021-01-08 DIAGNOSIS — E039 Hypothyroidism, unspecified: Secondary | ICD-10-CM | POA: Diagnosis not present

## 2021-02-05 ENCOUNTER — Other Ambulatory Visit (HOSPITAL_COMMUNITY): Payer: Self-pay | Admitting: *Deleted

## 2021-02-06 ENCOUNTER — Other Ambulatory Visit: Payer: Self-pay

## 2021-02-06 ENCOUNTER — Ambulatory Visit (HOSPITAL_COMMUNITY)
Admission: RE | Admit: 2021-02-06 | Discharge: 2021-02-06 | Disposition: A | Discharge status: Home / Self Care | Payer: Medicare Other | Source: Ambulatory Visit | Attending: Internal Medicine | Admitting: Internal Medicine

## 2021-02-06 DIAGNOSIS — M810 Age-related osteoporosis without current pathological fracture: Secondary | ICD-10-CM | POA: Diagnosis not present

## 2021-02-06 MED ORDER — DENOSUMAB 60 MG/ML ~~LOC~~ SOSY
PREFILLED_SYRINGE | SUBCUTANEOUS | Status: AC
  Filled 2021-02-06: qty 1

## 2021-02-06 MED ORDER — DENOSUMAB 60 MG/ML ~~LOC~~ SOSY
60.0000 mg | PREFILLED_SYRINGE | Freq: Once | SUBCUTANEOUS | Status: AC
  Administered 2021-02-06: 60 mg via SUBCUTANEOUS

## 2021-02-06 NOTE — Discharge Instructions (Signed)
Denosumab injection °What is this medicine? °DENOSUMAB (den oh sue mab) slows bone breakdown. Prolia is used to treat osteoporosis in women after menopause and in men, and in people who are taking corticosteroids for 6 months or more. Xgeva is used to treat a high calcium level due to cancer and to prevent bone fractures and other bone problems caused by multiple myeloma or cancer bone metastases. Xgeva is also used to treat giant cell tumor of the bone. °This medicine may be used for other purposes; ask your health care provider or pharmacist if you have questions. °COMMON BRAND NAME(S): Prolia, XGEVA °What should I tell my health care provider before I take this medicine? °They need to know if you have any of these conditions: °· dental disease °· having surgery or tooth extraction °· infection °· kidney disease °· low levels of calcium or Vitamin D in the blood °· malnutrition °· on hemodialysis °· skin conditions or sensitivity °· thyroid or parathyroid disease °· an unusual reaction to denosumab, other medicines, foods, dyes, or preservatives °· pregnant or trying to get pregnant °· breast-feeding °How should I use this medicine? °This medicine is for injection under the skin. It is given by a health care professional in a hospital or clinic setting. °A special MedGuide will be given to you before each treatment. Be sure to read this information carefully each time. °For Prolia, talk to your pediatrician regarding the use of this medicine in children. Special care may be needed. For Xgeva, talk to your pediatrician regarding the use of this medicine in children. While this drug may be prescribed for children as young as 13 years for selected conditions, precautions do apply. °Overdosage: If you think you have taken too much of this medicine contact a poison control center or emergency room at once. °NOTE: This medicine is only for you. Do not share this medicine with others. °What if I miss a dose? °It is  important not to miss your dose. Call your doctor or health care professional if you are unable to keep an appointment. °What may interact with this medicine? °Do not take this medicine with any of the following medications: °· other medicines containing denosumab °This medicine may also interact with the following medications: °· medicines that lower your chance of fighting infection °· steroid medicines like prednisone or cortisone °This list may not describe all possible interactions. Give your health care provider a list of all the medicines, herbs, non-prescription drugs, or dietary supplements you use. Also tell them if you smoke, drink alcohol, or use illegal drugs. Some items may interact with your medicine. °What should I watch for while using this medicine? °Visit your doctor or health care professional for regular checks on your progress. Your doctor or health care professional may order blood tests and other tests to see how you are doing. °Call your doctor or health care professional for advice if you get a fever, chills or sore throat, or other symptoms of a cold or flu. Do not treat yourself. This drug may decrease your body's ability to fight infection. Try to avoid being around people who are sick. °You should make sure you get enough calcium and vitamin D while you are taking this medicine, unless your doctor tells you not to. Discuss the foods you eat and the vitamins you take with your health care professional. °See your dentist regularly. Brush and floss your teeth as directed. Before you have any dental work done, tell your dentist you are   receiving this medicine. Do not become pregnant while taking this medicine or for 5 months after stopping it. Talk with your doctor or health care professional about your birth control options while taking this medicine. Women should inform their doctor if they wish to become pregnant or think they might be pregnant. There is a potential for serious side  effects to an unborn child. Talk to your health care professional or pharmacist for more information. What side effects may I notice from receiving this medicine? Side effects that you should report to your doctor or health care professional as soon as possible:  allergic reactions like skin rash, itching or hives, swelling of the face, lips, or tongue  bone pain  breathing problems  dizziness  jaw pain, especially after dental work  redness, blistering, peeling of the skin  signs and symptoms of infection like fever or chills; cough; sore throat; pain or trouble passing urine  signs of low calcium like fast heartbeat, muscle cramps or muscle pain; pain, tingling, numbness in the hands or feet; seizures  unusual bleeding or bruising  unusually weak or tired Side effects that usually do not require medical attention (report to your doctor or health care professional if they continue or are bothersome):  constipation  diarrhea  headache  joint pain  loss of appetite  muscle pain  runny nose  tiredness  upset stomach This list may not describe all possible side effects. Call your doctor for medical advice about side effects. You may report side effects to FDA at 1-800-FDA-1088. Where should I keep my medicine? This medicine is only given in a clinic, doctor's office, or other health care setting and will not be stored at home. NOTE: This sheet is a summary. It may not cover all possible information. If you have questions about this medicine, talk to your doctor, pharmacist, or health care provider.  2021 Elsevier/Gold Standard (2018-04-15 16:10:44)

## 2021-02-10 DIAGNOSIS — Z1211 Encounter for screening for malignant neoplasm of colon: Secondary | ICD-10-CM | POA: Diagnosis not present

## 2021-02-14 DIAGNOSIS — R300 Dysuria: Secondary | ICD-10-CM | POA: Diagnosis not present

## 2021-02-21 ENCOUNTER — Ambulatory Visit
Admission: RE | Admit: 2021-02-21 | Discharge: 2021-02-21 | Disposition: A | Discharge status: Home / Self Care | Payer: Medicare Other | Source: Ambulatory Visit | Attending: Internal Medicine | Admitting: Internal Medicine

## 2021-02-21 ENCOUNTER — Other Ambulatory Visit: Payer: Self-pay

## 2021-02-21 DIAGNOSIS — Z1231 Encounter for screening mammogram for malignant neoplasm of breast: Secondary | ICD-10-CM

## 2021-03-05 ENCOUNTER — Other Ambulatory Visit: Payer: Medicare Other

## 2021-04-18 ENCOUNTER — Ambulatory Visit (AMBULATORY_SURGERY_CENTER): Payer: Self-pay

## 2021-04-18 ENCOUNTER — Other Ambulatory Visit: Payer: Self-pay

## 2021-04-18 VITALS — Ht 65.0 in | Wt 113.0 lb

## 2021-04-18 DIAGNOSIS — Z01818 Encounter for other preprocedural examination: Secondary | ICD-10-CM

## 2021-04-18 DIAGNOSIS — R195 Other fecal abnormalities: Secondary | ICD-10-CM

## 2021-04-18 NOTE — Progress Notes (Signed)
No egg or soy allergy known to patient  No issues with past sedation with any surgeries or procedures Patient denies ever being told they had issues or difficulty with intubation  No FH of Malignant Hyperthermia No diet pills per patient No home 02 use per patient  No blood thinners per patient  Pt denies issues with constipation  No A fib or A flutter  EMMI video via MyChart  COVID 19 guidelines implemented in [place] today with Pt and RN  Pt is fully vaccinated for Covid x 2; Discussed with pt there will be an out-of-pocket cost for prep and that varies from $0 to 70 dollars  Due to the COVID-19 pandemic we are asking patients to follow certain guidelines.  Pt aware of COVID protocols and [facility] guidelines   

## 2021-05-01 ENCOUNTER — Encounter: Payer: Self-pay | Admitting: Certified Registered Nurse Anesthetist

## 2021-05-02 ENCOUNTER — Encounter: Payer: Self-pay | Admitting: Gastroenterology

## 2021-05-02 ENCOUNTER — Other Ambulatory Visit: Payer: Self-pay

## 2021-05-02 ENCOUNTER — Ambulatory Visit (AMBULATORY_SURGERY_CENTER): Payer: Medicare Other | Admitting: Gastroenterology

## 2021-05-02 VITALS — BP 110/70 | HR 78 | Temp 96.2°F | Resp 15 | Ht 65.0 in | Wt 113.0 lb

## 2021-05-02 DIAGNOSIS — D128 Benign neoplasm of rectum: Secondary | ICD-10-CM

## 2021-05-02 DIAGNOSIS — D122 Benign neoplasm of ascending colon: Secondary | ICD-10-CM | POA: Diagnosis not present

## 2021-05-02 DIAGNOSIS — K648 Other hemorrhoids: Secondary | ICD-10-CM

## 2021-05-02 DIAGNOSIS — D127 Benign neoplasm of rectosigmoid junction: Secondary | ICD-10-CM | POA: Diagnosis not present

## 2021-05-02 DIAGNOSIS — D125 Benign neoplasm of sigmoid colon: Secondary | ICD-10-CM | POA: Diagnosis not present

## 2021-05-02 DIAGNOSIS — D120 Benign neoplasm of cecum: Secondary | ICD-10-CM | POA: Diagnosis not present

## 2021-05-02 DIAGNOSIS — D123 Benign neoplasm of transverse colon: Secondary | ICD-10-CM

## 2021-05-02 DIAGNOSIS — R195 Other fecal abnormalities: Secondary | ICD-10-CM | POA: Diagnosis not present

## 2021-05-02 DIAGNOSIS — K5730 Diverticulosis of large intestine without perforation or abscess without bleeding: Secondary | ICD-10-CM

## 2021-05-02 DIAGNOSIS — Z1211 Encounter for screening for malignant neoplasm of colon: Secondary | ICD-10-CM | POA: Diagnosis not present

## 2021-05-02 DIAGNOSIS — J449 Chronic obstructive pulmonary disease, unspecified: Secondary | ICD-10-CM | POA: Diagnosis not present

## 2021-05-02 DIAGNOSIS — I10 Essential (primary) hypertension: Secondary | ICD-10-CM | POA: Diagnosis not present

## 2021-05-02 MED ORDER — SODIUM CHLORIDE 0.9 % IV SOLN: 500.0000 mL | Freq: Once | INTRAVENOUS | Status: DC

## 2021-05-02 NOTE — Progress Notes (Signed)
Pt's states no medical or surgical changes since previsit or office visit. 

## 2021-05-02 NOTE — Progress Notes (Signed)
Called to room to assist during endoscopic procedure.  Patient ID and intended procedure confirmed with present staff. Received instructions for my participation in the procedure from the performing physician.  

## 2021-05-02 NOTE — Op Note (Signed)
[facility] Patient Name: Kristine Davidson Procedure Date: 05/02/2021 11:51 AM MRN: [id number] Endoscopist: Viviann Spare P. Adela Lank , MD Age: 70 Referring MD:  Date of Birth: 1951-11-06 Gender: Female Account #: [id number] Procedure:                Colonoscopy Indications:              Positive Cologuard test Medicines:                Monitored Anesthesia Care Procedure:                Pre-Anesthesia Assessment:                           - Prior to the procedure, a History and Physical                            was performed, and patient medications and                            allergies were reviewed. The patient's tolerance of                            previous anesthesia was also reviewed. The risks                            and benefits of the procedure and the sedation                            options and risks were discussed with the patient.                            All questions were answered, and informed consent                            was obtained. Prior Anticoagulants: The patient has                            taken no previous anticoagulant or antiplatelet                            agents. ASA Grade Assessment: III - A patient with                            severe systemic disease. After reviewing the risks                            and benefits, the patient was deemed in                            satisfactory condition to undergo the procedure.                           After obtaining informed consent, the colonoscope  was passed under direct vision. Throughout the                            procedure, the patient's blood pressure, pulse, and                            oxygen saturations were monitored continuously. The                            Olympus PFC-[id number] (#[id number]) Colonoscope was                            introduced through the anus and advanced to the the                            cecum, identified by  appendiceal orifice and                            ileocecal valve. The colonoscopy was performed                            without difficulty. The patient tolerated the                            procedure well. The quality of the bowel                            preparation was adequate. The ileocecal valve,                            appendiceal orifice, and rectum were photographed. Scope In: 11:59:48 AM Scope Out: 12:42:05 PM Scope Withdrawal Time: 0 hours 31 minutes 54 seconds  Total Procedure Duration: 0 hours 42 minutes 17 seconds  Findings:                 The perianal and digital rectal examinations were                            normal.                           A 10 mm polyp was found in the cecum. The polyp was                            flat. The polyp was removed with a cold snare.                            Resection and retrieval were complete.                           A 4 mm polyp was found in the ascending colon. The                            polyp was sessile. The polyp was removed with  a                            cold snare. There suprisingly was some persistent                            oozing at the site following removal, treated with                            snare tip coagulation with immediate hemostasis.                            Resection and retrieval were complete.                           Two sessile polyps were found in the transverse                            colon. The polyps were 4 to 6 mm in size. These                            polyps were removed with a cold snare. Resection                            and retrieval were complete.                           A 4 mm polyp was found in the sigmoid colon. The                            polyp was sessile. The polyp was removed with a                            cold snare. Resection and retrieval were complete.                           A 3 mm polyp was found in the recto-sigmoid colon.                             The polyp was sessile. The polyp was removed with a                            cold snare. Resection and retrieval were complete.                           A 15 to 20 mm polyp was found in the proximal                            rectum. The polyp was sessile. The polyp was                            removed with a saline injection-lift technique  using a hot snare on Endocut setting. Resection and                            retrieval were complete. Area distal to the lesion                            was tattooed with an injection of Spot (carbon                            black).                           Multiple small-mouthed diverticula were found in                            the transverse colon and left colon.                           Internal hemorrhoids were found during                            retroflexion. The hemorrhoids were small.                           There was residual liquid stool in the right colon                            which took several minutes to lavage. The colon was                            tortous. The exam was otherwise without abnormality. Complications:            No immediate complications. Estimated blood loss:                            Minimal. Estimated Blood Loss:     Estimated blood loss was minimal. Impression:               - One 10 mm polyp in the cecum, removed with a cold                            snare. Resected and retrieved.                           - One 4 mm polyp in the ascending colon, removed                            with a cold snare. Resected and retrieved.                           - Two 4 to 6 mm polyps in the transverse colon,                            removed with a cold snare. Resected and retrieved.                           -  One 4 mm polyp in the sigmoid colon, removed with                            a cold snare. Resected and retrieved.                           - One 3  mm polyp at the recto-sigmoid colon,                            removed with a cold snare. Resected and retrieved.                           - One 15 to 20 mm polyp in the proximal rectum,                            removed using injection-lift and a hot snare.                            Resected and retrieved. Tattooed.                           - Diverticulosis in the transverse colon and in the                            left colon.                           - Internal hemorrhoids.                           - Tortous colon                           - The examination was otherwise normal. Recommendation:           - Patient has a contact number available for                            emergencies. The signs and symptoms of potential                            delayed complications were discussed with the                            patient. Return to normal activities tomorrow.                            Written discharge instructions were provided to the                            patient.                           - Resume previous diet.                           -  Continue present medications.                           - Await pathology results.                           - No ibuprofen, naproxen, or other non-steroidal                            anti-inflammatory drugs for 2 weeks after polyp                            removal. Remo Lipps P. Ariannah Arenson, MD 05/02/2021 12:52:43 PM This report has been signed electronically.

## 2021-05-02 NOTE — Progress Notes (Signed)
VS taken by [place] 

## 2021-05-02 NOTE — Patient Instructions (Addendum)
Read all of the handouts given to you by your recovery room nurse.  No aspirin, aleve or ibuprofen for 2 weeks to help to prevent bleeding.  Do come back when the doctor suggests.  YOU HAD AN ENDOSCOPIC PROCEDURE TODAY AT [facility]:   Refer to the procedure report that was given to you for any specific questions about what was found during the examination.  If the procedure report does not answer your questions, please call your gastroenterologist to clarify.  If you requested that your care partner not be given the details of your procedure findings, then the procedure report has been included in a sealed envelope for you to review at your convenience later.  YOU SHOULD EXPECT: Some feelings of bloating in the abdomen. Passage of more gas than usual.  Walking can help get rid of the air that was put into your GI tract during the procedure and reduce the bloating. If you had a lower endoscopy (such as a colonoscopy or flexible sigmoidoscopy) you may notice spotting of blood in your stool or on the toilet paper. If you underwent a bowel prep for your procedure, you may not have a normal bowel movement for a few days.  Please Note:  You might notice some irritation and congestion in your nose or some drainage.  This is from the oxygen used during your procedure.  There is no need for concern and it should clear up in a day or so.  SYMPTOMS TO REPORT IMMEDIATELY:   Following lower endoscopy (colonoscopy or flexible sigmoidoscopy):  Excessive amounts of blood in the stool  Significant tenderness or worsening of abdominal pains  Swelling of the abdomen that is new, acute  Fever of 100F or higher     For urgent or emergent issues, a gastroenterologist can be reached at any hour by calling [phone]. Do not use MyChart messaging for urgent concerns.    DIET:  We do recommend a small meal at first, but then you may proceed to your regular diet.  Drink plenty of fluids but you  should avoid alcoholic beverages for 24 hours. Try to eat a high fiber diet, and drink plenty of water.  ACTIVITY:  You should plan to take it easy for the rest of today and you should NOT DRIVE or use heavy machinery until tomorrow (because of the sedation medicines used during the test).    FOLLOW UP: Our staff will call the number listed on your records 48-72 hours following your procedure to check on you and address any questions or concerns that you may have regarding the information given to you following your procedure. If we do not reach you, we will leave a message.  We will attempt to reach you two times.  During this call, we will ask if you have developed any symptoms of COVID 19. If you develop any symptoms (ie: fever, flu-like symptoms, shortness of breath, cough etc.) before then, please call [phone].  If you test positive for Covid 19 in the 2 weeks post procedure, please call and report this information to [place].    If any biopsies were taken you will be contacted by phone or by letter within the next 1-3 weeks.  Please call us at [phone] if you have not heard about the biopsies in 3 weeks.    SIGNATURES/CONFIDENTIALITY: You and/or your care partner have signed paperwork which will be entered into your electronic medical record.  These signatures attest to the fact  that that the information above on your After Visit Summary has been reviewed and is understood.  Full responsibility of the confidentiality of this discharge information lies with you and/or your care-partner.

## 2021-05-02 NOTE — Progress Notes (Signed)
1205 Ephedrine 10 mg given IV due to low BP, MD updated.

## 2021-05-02 NOTE — Progress Notes (Signed)
Report given to PACU, vss 

## 2021-05-06 ENCOUNTER — Telehealth: Payer: Self-pay

## 2021-05-06 NOTE — Telephone Encounter (Signed)
  Follow up Call-  Call back number 05/02/2021  Post procedure Call Back phone  # [phone]  Permission to leave phone message Yes  Some recent data might be hidden     Patient questions:  Do you have a fever, pain , or abdominal swelling? No. Pain Score  0 *  Have you tolerated food without any problems? Yes.    Have you been able to return to your normal activities? Yes.    Do you have any questions about your discharge instructions: Diet   No. Medications  No. Follow up visit  No.  Do you have questions or concerns about your Care? No.  Actions: * If pain score is 4 or above: No action needed, pain <4.  1. Have you developed a fever since your procedure? no  2.   Have you had an respiratory symptoms (SOB or cough) since your procedure? no  3.   Have you tested positive for COVID 19 since your procedure no  4.   Have you had any family members/close contacts diagnosed with the COVID 19 since your procedure?  no   If yes to any of these questions please route to Joylene John, RN and Joella Prince, RN

## 2021-05-21 DIAGNOSIS — G4452 New daily persistent headache (NDPH): Secondary | ICD-10-CM | POA: Diagnosis not present

## 2021-05-28 ENCOUNTER — Other Ambulatory Visit (HOSPITAL_COMMUNITY): Payer: Self-pay | Admitting: Internal Medicine

## 2021-05-28 DIAGNOSIS — G4452 New daily persistent headache (NDPH): Secondary | ICD-10-CM | POA: Diagnosis not present

## 2021-06-01 ENCOUNTER — Encounter (HOSPITAL_COMMUNITY): Payer: Self-pay | Admitting: Radiology

## 2021-06-01 ENCOUNTER — Ambulatory Visit (HOSPITAL_COMMUNITY)
Admission: RE | Admit: 2021-06-01 | Discharge: 2021-06-01 | Disposition: A | Discharge status: Home / Self Care | Payer: Medicare Other | Source: Ambulatory Visit | Attending: Internal Medicine | Admitting: Internal Medicine

## 2021-06-01 ENCOUNTER — Other Ambulatory Visit: Payer: Self-pay

## 2021-06-01 DIAGNOSIS — G4452 New daily persistent headache (NDPH): Secondary | ICD-10-CM | POA: Diagnosis not present

## 2021-06-01 DIAGNOSIS — R519 Headache, unspecified: Secondary | ICD-10-CM | POA: Diagnosis not present

## 2021-06-01 MED ORDER — GADOBUTROL 1 MMOL/ML IV SOLN
7.5000 mL | Freq: Once | INTRAVENOUS | Status: AC | PRN
  Administered 2021-06-01: 5 mL via INTRAVENOUS

## 2021-07-22 ENCOUNTER — Other Ambulatory Visit: Payer: Self-pay | Admitting: Internal Medicine

## 2021-07-22 DIAGNOSIS — M810 Age-related osteoporosis without current pathological fracture: Secondary | ICD-10-CM | POA: Diagnosis not present

## 2021-07-22 DIAGNOSIS — Z720 Tobacco use: Secondary | ICD-10-CM | POA: Diagnosis not present

## 2021-07-22 DIAGNOSIS — E039 Hypothyroidism, unspecified: Secondary | ICD-10-CM | POA: Diagnosis not present

## 2021-07-31 ENCOUNTER — Other Ambulatory Visit: Payer: Medicare Other

## 2021-08-07 ENCOUNTER — Ambulatory Visit (AMBULATORY_SURGERY_CENTER): Payer: Self-pay | Admitting: *Deleted

## 2021-08-07 ENCOUNTER — Other Ambulatory Visit: Payer: Self-pay

## 2021-08-07 VITALS — Ht 66.0 in | Wt 110.0 lb

## 2021-08-07 DIAGNOSIS — Z86010 Personal history of colonic polyps: Secondary | ICD-10-CM

## 2021-08-07 DIAGNOSIS — D126 Benign neoplasm of colon, unspecified: Secondary | ICD-10-CM

## 2021-08-07 NOTE — Progress Notes (Signed)
Pre-visit conducted in person. No egg or soy allergy known to patient  No issues with past sedation with any surgeries or procedures Patient denies ever being told they had issues or difficulty with intubation  No FH of Malignant Hyperthermia No diet pills per patient No home 02 use per patient  No blood thinners per patient  Pt denies issues with constipation  No A fib or A flutter  EMMI video to pt or via [place] 19 guidelines implemented in [place] today with Pt and RN  Pt is fully vaccinated  for Covid   Due to the COVID-19 pandemic we are asking patients to follow certain guidelines.  Pt aware of COVID protocols and [facility] guidelines

## 2021-08-12 ENCOUNTER — Other Ambulatory Visit (HOSPITAL_COMMUNITY): Payer: Self-pay | Admitting: *Deleted

## 2021-08-13 ENCOUNTER — Encounter (HOSPITAL_COMMUNITY): Payer: Medicare Other

## 2021-08-20 ENCOUNTER — Encounter: Payer: Self-pay | Admitting: Certified Registered Nurse Anesthetist

## 2021-08-21 ENCOUNTER — Other Ambulatory Visit: Payer: Self-pay

## 2021-08-21 ENCOUNTER — Encounter: Payer: Self-pay | Admitting: Gastroenterology

## 2021-08-21 ENCOUNTER — Ambulatory Visit (AMBULATORY_SURGERY_CENTER): Payer: Medicare Other | Admitting: Gastroenterology

## 2021-08-21 VITALS — BP 99/62 | HR 70 | Temp 97.7°F | Resp 18 | Ht 65.0 in | Wt 110.0 lb

## 2021-08-21 DIAGNOSIS — Z0389 Encounter for observation for other suspected diseases and conditions ruled out: Secondary | ICD-10-CM | POA: Diagnosis not present

## 2021-08-21 DIAGNOSIS — K648 Other hemorrhoids: Secondary | ICD-10-CM

## 2021-08-21 DIAGNOSIS — D126 Benign neoplasm of colon, unspecified: Secondary | ICD-10-CM

## 2021-08-21 DIAGNOSIS — D128 Benign neoplasm of rectum: Secondary | ICD-10-CM | POA: Diagnosis not present

## 2021-08-21 DIAGNOSIS — K635 Polyp of colon: Secondary | ICD-10-CM

## 2021-08-21 DIAGNOSIS — J449 Chronic obstructive pulmonary disease, unspecified: Secondary | ICD-10-CM | POA: Diagnosis not present

## 2021-08-21 DIAGNOSIS — E079 Disorder of thyroid, unspecified: Secondary | ICD-10-CM | POA: Diagnosis not present

## 2021-08-21 MED ORDER — SODIUM CHLORIDE 0.9 % IV SOLN: 500.0000 mL | Freq: Once | INTRAVENOUS | Status: DC

## 2021-08-21 NOTE — Progress Notes (Signed)
Called to room to assist during endoscopic procedure.  Patient ID and intended procedure confirmed with present staff. Received instructions for my participation in the procedure from the performing physician.  

## 2021-08-21 NOTE — Progress Notes (Signed)
Pt's states no medical or surgical changes since previsit or office visit. 

## 2021-08-21 NOTE — Op Note (Signed)
[facility] Patient Name: Kristine Davidson Procedure Date: 08/21/2021 9:34 AM MRN: [id number] Endoscopist: Remo Lipps P. Havery Moros , MD Age: 70 Referring MD:  Date of Birth: [birth year] Gender: Female Account #: [id number] Procedure:                Flexible Sigmoidoscopy Indications:              High risk colon cancer surveillance: Personal                            history of colonic polyps - large rectal polyp -                            TVA with HGD removed in 04/2021, pathology unable to                            clear margin, here for surveillance flex sig Medicines:                Monitored Anesthesia Care Procedure:                Pre-Anesthesia Assessment:                           - Prior to the procedure, a History and Physical                            was performed, and patient medications and                            allergies were reviewed. The patient's tolerance of                            previous anesthesia was also reviewed. The risks                            and benefits of the procedure and the sedation                            options and risks were discussed with the patient.                            All questions were answered, and informed consent                            was obtained. Prior Anticoagulants: The patient has                            taken no previous anticoagulant or antiplatelet                            agents. ASA Grade Assessment: III - A patient with                            severe systemic disease. After reviewing the risks  and benefits, the patient was deemed in                            satisfactory condition to undergo the procedure.                           After obtaining informed consent, the scope was                            passed under direct vision. The Olympus [id number]-[id number]                            [id number]) Colonoscope was introduced through the                             anus and advanced to the the sigmoid colon. The                            flexible sigmoidoscopy was accomplished without                            difficulty. The patient tolerated the procedure                            well. The quality of the bowel preparation was good. Scope In: Scope Out: Findings:                 The perianal and digital rectal examinations were                            normal.                           A medium post polypectomy scar was found in the                            rectum just proximal to tattoo. The scar tissue was                            healthy in appearance. There was no evidence of the                            previous polyp. Biopsies were taken with a cold                            forceps for histology from the scar given prior                            pathology findings.                           Internal hemorrhoids were found during                            retroflexion. The  hemorrhoids were small.                           The exam was otherwise without abnormality (very                            distal sigmoid colon reached) Complications:            No immediate complications. Estimated blood loss:                            Minimal. Estimated Blood Loss:     Estimated blood loss was minimal. Impression:               - Post-polypectomy scar in the rectum which is well                            healed. Biopsied.                           - Internal hemorrhoids.                           - The examination was otherwise normal. Recommendation:           - Patient has a contact number available for                            emergencies. The signs and symptoms of potential                            delayed complications were discussed with the                            patient. Return to normal activities tomorrow.                            Written discharge instructions were provided to the                             patient.                           - Resume previous diet.                           - Continue present medications.                           - Await pathology results. Anticipate repeat                            colonoscopy in 3 years [name]. [name], MD 08/21/2021 9:53:15 AM This report has been signed electronically.

## 2021-08-21 NOTE — Patient Instructions (Signed)
Read all of the handouts given to you by your recovery room nurse. ? ?YOU HAD AN ENDOSCOPIC PROCEDURE TODAY AT THE [facility]:   Refer to the procedure report that was given to you for any specific questions about what was found during the examination.  If the procedure report does not answer your questions, please call your gastroenterologist to clarify.  If you requested that your care partner not be given the details of your procedure findings, then the procedure report has been included in a sealed envelope for you to review at your convenience later. ? ?YOU SHOULD EXPECT: Some feelings of bloating in the abdomen. Passage of more gas than usual.  Walking can help get rid of the air that was put into your GI tract during the procedure and reduce the bloating. If you had a lower endoscopy (such as a colonoscopy or flexible sigmoidoscopy) you may notice spotting of blood in your stool or on the toilet paper. If you underwent a bowel prep for your procedure, you may not have a normal bowel movement for a few days. ? ?Please Note:  You might notice some irritation and congestion in your nose or some drainage.  This is from the oxygen used during your procedure.  There is no need for concern and it should clear up in a day or so. ? ?SYMPTOMS TO REPORT IMMEDIATELY: ? ?Following lower endoscopy (colonoscopy or flexible sigmoidoscopy): ? Excessive amounts of blood in the stool ? Significant tenderness or worsening of abdominal pains ? Swelling of the abdomen that is new, acute ? Fever of 100?F or higher ? ?  ?For urgent or emergent issues, a gastroenterologist can be reached at any hour by calling [phone]. ?Do not use MyChart messaging for urgent concerns.  ? ? ?DIET:  We do recommend a small meal at first, but then you may proceed to your regular diet.  Drink plenty of fluids but you should avoid alcoholic beverages for 24 hours. ? ?ACTIVITY:  You should plan to take it easy for the rest of today  and you should NOT DRIVE or use heavy machinery until tomorrow (because of the sedation medicines used during the test).   ? ?FOLLOW UP: ?Our staff will call the number listed on your records 48-72 hours following your procedure to check on you and address any questions or concerns that you may have regarding the information given to you following your procedure. If we do not reach you, we will leave a message.  We will attempt to reach you two times.  During this call, we will ask if you have developed any symptoms of COVID 19. If you develop any symptoms (ie: fever, flu-like symptoms, shortness of breath, cough etc.) before then, please call [phone].  If you test positive for Covid 19 in the 2 weeks post procedure, please call and report this information to us.   ? ?If any biopsies were taken you will be contacted by phone or by letter within the next 1-3 weeks.  Please call us at [phone] if you have not heard about the biopsies in 3 weeks.  ? ? ?SIGNATURES/CONFIDENTIALITY: ?You and/or your care partner have signed paperwork which will be entered into your electronic medical record.  These signatures attest to the fact that that the information above on your After Visit Summary has been reviewed and is understood.  Full responsibility of the confidentiality of this discharge information lies with you and/or your care-partner.  ?

## 2021-08-21 NOTE — Progress Notes (Signed)
[facility] History and Physical   Primary Care Physician:  Michael Boston, MD   Reason for Procedure:   Surveillance polyp - TVA with HGD  Plan:    Flex sig     HPI: Kristine Davidson is a 70 y.o. female who had a colonoscopy in May 2022. Multiple polyps removed including 2cm rectal polyp which was a TVA with HGD. Thought to have been completely removed however pathology could not clear margin. Here for surveillance to ensure no residual / recurrent polyp. Otherwise feeling well without complaints.    Past Medical History:  Diagnosis Date   Allergy    seasonal allergies   Anemia    hx of    Anxiety    on meds   Aortic atherosclerosis (HCC)    Arthritis    generalized   B12 deficiency 03/31/2012   Depression    on meds--hx of   Diverticular disease    Emphysema of lung ([place])    HSV-2 (herpes simplex virus 2) infection    Hyperlipidemia    diet controlled   Hypertension    Mitral valve prolapse    Osteoporosis 01/14/2018   DEXA scan at [place] 01/14/18 T score -2.8 at Rt femur neck and L1-L4 AP spine   Substance abuse ([place])    Thoracic aortic ectasia (HCC)    Thyroid disease    on meds   Tobacco dependence    Vitamin D deficiency     Past Surgical History:  Procedure Laterality Date   COLONOSCOPY     D and C     TONSILLECTOMY     VARICOSE VEIN SURGERY  2000    Prior to Admission medications   Medication Sig Start Date End Date Taking? Authorizing Provider  buPROPion (WELLBUTRIN XL) 150 MG 24 hr tablet Take 1 tablet (150 mg total) by mouth daily. 12/07/19  Yes Wendie Agreste, MD  Cholecalciferol (VITAMIN D3 PO) Take 1 tablet by mouth daily.   Yes [provider]  Cyanocobalamin (VITAMIN B-12 PO) Take 1 tablet by mouth daily.   Yes [provider]  levothyroxine (SYNTHROID) 50 MCG tablet TAKE 1 TABLET (50 MCG TOTAL) BY MOUTH DAILY. 12/07/19  Yes Wendie Agreste, MD  MAGNESIUM PO Take 200 mg by mouth daily.   Yes [provider]  Multiple Vitamin (MULTIVITAMINS PO) Take 1 tablet by mouth daily.   Yes [provider]  PARoxetine (PAXIL) 30 MG tablet Take 1 tablet (30 mg total) by mouth at bedtime. Patient taking differently: Take 30 mg by mouth daily. 12/07/19  Yes Wendie Agreste, MD  traZODone (DESYREL) 50 MG tablet Take 1-2 tablets (50-100 mg total) by mouth at bedtime. 12/07/19  Yes Wendie Agreste, MD  valACYclovir (VALTREX) 500 MG tablet Take 500 mg by mouth daily as needed. 12/09/20  Yes [provider]  triamcinolone (NASACORT) 55 MCG/ACT AERO nasal inhaler Place 2 sprays into the nose daily. Patient not taking: Reported on 08/07/2021 10/29/18   Shawnee Knapp, MD  valACYclovir (VALTREX) 1000 MG tablet Take 1 tablet (1,000 mg total) by mouth daily. 12/07/19   Wendie Agreste, MD    Current Outpatient Medications  Medication Sig Dispense Refill   buPROPion (WELLBUTRIN XL) 150 MG 24 hr tablet Take 1 tablet (150 mg total) by mouth daily. 90 tablet 3   Cholecalciferol (VITAMIN D3 PO) Take 1 tablet by mouth daily.     Cyanocobalamin (VITAMIN B-12 PO) Take 1 tablet by mouth  daily.     levothyroxine (SYNTHROID) 50 MCG tablet TAKE 1 TABLET (50 MCG TOTAL) BY MOUTH DAILY. 90 tablet 3   MAGNESIUM PO Take 200 mg by mouth daily.     Multiple Vitamin (MULTIVITAMINS PO) Take 1 tablet by mouth daily.     PARoxetine (PAXIL) 30 MG tablet Take 1 tablet (30 mg total) by mouth at bedtime. (Patient taking differently: Take 30 mg by mouth daily.) 90 tablet 3   traZODone (DESYREL) 50 MG tablet Take 1-2 tablets (50-100 mg total) by mouth at bedtime. 180 tablet 3   valACYclovir (VALTREX) 500 MG tablet Take 500 mg by mouth daily as needed.     triamcinolone (NASACORT) 55 MCG/ACT AERO nasal inhaler Place 2 sprays into the nose daily. (Patient not taking: Reported on 08/07/2021) 1 Inhaler 2   valACYclovir (VALTREX) 1000 MG tablet Take 1 tablet (1,000 mg total) by mouth daily. 90 tablet 3   Current  Facility-Administered Medications  Medication Dose Route Frequency Provider Last Rate Last Admin   0.9 %  sodium chloride infusion  500 mL Intravenous Once Nichlos Kunzler, [name], MD        Allergies as of 08/21/2021 - Review Complete 08/21/2021  Allergen Reaction Noted   Codeine Itching 02/17/2012    Family History  Problem Relation Age of Onset   Stroke Mother    Macular degeneration Mother    Breast cancer Mother 33   Parkinson's disease Father    Parkinson's disease Sister    Diabetes Sister    Colon polyps Neg Hx    Colon cancer Neg Hx    Esophageal cancer Neg Hx    Stomach cancer Neg Hx    Rectal cancer Neg Hx     Social History   Socioeconomic History   Marital status: Divorced    Spouse name: Not on file   Number of children: 0   Years of education: Not on file   Highest education level: Professional school degree (e.g., MD, DDS, DVM, JD)  Occupational History   Occupation: Product manager: [employer]    Comment: Retired  Tobacco Use   Smoking status: Every Day    Packs/day: 0.50    Years: 43.00    Pack years: 21.50    Types: Cigarettes   Smokeless tobacco: Never  Vaping Use   Vaping Use: Never used  Substance and Sexual Activity   Alcohol use: No    Alcohol/week: 0.0 standard drinks   Drug use: No   Sexual activity: Never  Other Topics Concern   Not on file  Social History Narrative   Lives alone.    Social Determinants of Health   Financial Resource Strain: Not on file  Food Insecurity: Not on file  Transportation Needs: Not on file  Physical Activity: Not on file  Stress: Not on file  Social Connections: Not on file  Intimate Partner Violence: Not on file    Review of Systems: All other review of systems negative except as mentioned in the HPI.  Physical Exam: Vital signs BP 101/64   Pulse 74   Temp 97.7 F (36.5 C) (Temporal)   Ht '5\' 5"'$  (1.651 m)   Wt 110 lb (49.9 kg)   SpO2 98%   BMI 18.30 kg/m    General:   Alert,  Well-developed, well-nourished, pleasant and cooperative in NAD Lungs:  Clear throughout to auscultation.   Heart:  Regular rate and rhythm;  Abdomen:  Soft, nontender and nondistended. Marland Kitchen  Neuro/Psych:  Alert and cooperative. Normal mood and affect. A and O x 3  Jolly Mango, MD [facility]

## 2021-08-26 ENCOUNTER — Other Ambulatory Visit: Payer: Self-pay | Admitting: Internal Medicine

## 2021-08-26 ENCOUNTER — Telehealth: Payer: Self-pay

## 2021-08-26 DIAGNOSIS — Z720 Tobacco use: Secondary | ICD-10-CM

## 2021-08-26 NOTE — Telephone Encounter (Signed)
  Follow up Call-  Call back number 08/21/2021 05/02/2021  Post procedure Call Back phone  # [phone]  Permission to leave phone message Yes Yes  Some recent data might be hidden     Patient questions:  Do you have a fever, pain , or abdominal swelling? No. Pain Score  0 *  Have you tolerated food without any problems? Yes.    Have you been able to return to your normal activities? Yes.    Do you have any questions about your discharge instructions: Diet   No. Medications  No. Follow up visit  No.  Do you have questions or concerns about your Care? No.  Actions: * If pain score is 4 or above: No action needed, pain <4.

## 2021-09-11 DIAGNOSIS — Z23 Encounter for immunization: Secondary | ICD-10-CM | POA: Diagnosis not present

## 2021-09-11 DIAGNOSIS — Z20822 Contact with and (suspected) exposure to covid-19: Secondary | ICD-10-CM | POA: Diagnosis not present

## 2021-10-06 ENCOUNTER — Ambulatory Visit
Admission: RE | Admit: 2021-10-06 | Discharge: 2021-10-06 | Disposition: A | Discharge status: Home / Self Care | Payer: Medicare Other | Source: Ambulatory Visit | Attending: Internal Medicine | Admitting: Internal Medicine

## 2021-10-06 DIAGNOSIS — F17210 Nicotine dependence, cigarettes, uncomplicated: Secondary | ICD-10-CM | POA: Diagnosis not present

## 2021-10-06 DIAGNOSIS — Z720 Tobacco use: Secondary | ICD-10-CM

## 2021-10-21 HISTORY — PX: OTHER SURGICAL HISTORY: SHX169

## 2021-10-23 DIAGNOSIS — Z23 Encounter for immunization: Secondary | ICD-10-CM | POA: Diagnosis not present

## 2021-10-23 DIAGNOSIS — R0789 Other chest pain: Secondary | ICD-10-CM | POA: Diagnosis not present

## 2021-10-23 DIAGNOSIS — I480 Paroxysmal atrial fibrillation: Secondary | ICD-10-CM | POA: Diagnosis not present

## 2021-10-24 DIAGNOSIS — I480 Paroxysmal atrial fibrillation: Secondary | ICD-10-CM | POA: Diagnosis not present

## 2021-10-27 ENCOUNTER — Encounter (HOSPITAL_COMMUNITY): Payer: Self-pay | Admitting: Physician Assistant

## 2021-10-27 ENCOUNTER — Other Ambulatory Visit: Payer: Self-pay

## 2021-10-27 ENCOUNTER — Ambulatory Visit (HOSPITAL_COMMUNITY)
Admission: RE | Admit: 2021-10-27 | Discharge: 2021-10-27 | Disposition: A | Discharge status: Home / Self Care | Payer: Medicare Other | Source: Ambulatory Visit | Attending: Physician Assistant | Admitting: Physician Assistant

## 2021-10-27 VITALS — BP 110/80 | HR 79 | Ht 65.0 in | Wt 113.8 lb

## 2021-10-27 DIAGNOSIS — F17210 Nicotine dependence, cigarettes, uncomplicated: Secondary | ICD-10-CM | POA: Diagnosis not present

## 2021-10-27 DIAGNOSIS — I2510 Atherosclerotic heart disease of native coronary artery without angina pectoris: Secondary | ICD-10-CM | POA: Diagnosis not present

## 2021-10-27 DIAGNOSIS — I480 Paroxysmal atrial fibrillation: Secondary | ICD-10-CM | POA: Insufficient documentation

## 2021-10-27 DIAGNOSIS — Z7901 Long term (current) use of anticoagulants: Secondary | ICD-10-CM | POA: Insufficient documentation

## 2021-10-27 DIAGNOSIS — D6869 Other thrombophilia: Secondary | ICD-10-CM | POA: Insufficient documentation

## 2021-10-27 DIAGNOSIS — I341 Nonrheumatic mitral (valve) prolapse: Secondary | ICD-10-CM | POA: Diagnosis not present

## 2021-10-27 MED ORDER — APIXABAN 5 MG PO TABS: 5.0000 mg | ORAL_TABLET | Freq: Two times a day (BID) | ORAL | 3 refills | Status: DC

## 2021-10-27 NOTE — Progress Notes (Signed)
Primary Care Physician: Michael Boston, MD Primary Cardiologist: Dr Percival Spanish (remotely) Primary Electrophysiologist: none Referring Physician: Dr Roosevelt Locks is a 70 y.o. female with a history of hypothyroidism, aortic atherosclerosis, MVP, coronary calcification, ascending aortic aneurysm 4.2 cm, HLD, atrial fibrillation who presents for consultation in the [facility].  The patient was initially diagnosed with atrial fibrillation 10/23/21 after presenting to her PCP with symptoms of atypical chest pain, increased DOE, and a "funny feeling" with her heart beat. ECG showed afib with rapid rates. Patient was started on Eliquis for a CHADS2VASC score of 2 and diltiazem for rate control. She did not start either medication yet. She reports the night before her afib was diagnosed, she had bad indigestion type symptoms. She has never had chest pain prior. She denies any significant snoring or alcohol use.   Today, she denies symptoms of palpitations, shortness of breath, orthopnea, PND, lower extremity edema, dizziness, presyncope, syncope, snoring, daytime somnolence, bleeding, or neurologic sequela. The patient is tolerating medications without difficulties and is otherwise without complaint today.    Atrial Fibrillation Risk Factors:  she does not have symptoms or diagnosis of sleep apnea. she does not have a history of rheumatic fever. she does not have a history of alcohol use. The patient does not have a history of early familial atrial fibrillation or other arrhythmias.  she has a BMI of Body mass index is 18.94 kg/m.Marland Kitchen Filed Weights   10/27/21 1135  Weight: 51.6 kg    Family History  Problem Relation Age of Onset   Stroke Mother    Macular degeneration Mother    Breast cancer Mother 49   Parkinson's disease Father    Parkinson's disease Sister    Diabetes Sister    Colon polyps Neg Hx    Colon cancer Neg Hx    Esophageal cancer Neg Hx     Stomach cancer Neg Hx    Rectal cancer Neg Hx      Atrial Fibrillation Management history:  Previous antiarrhythmic drugs: none Previous cardioversions: none Previous ablations: none CHADS2VASC score: 2 Anticoagulation history: Eliquis   Past Medical History:  Diagnosis Date   Allergy    seasonal allergies   Anemia    hx of    Anxiety    on meds   Aortic atherosclerosis ([place])    Arthritis    generalized   B12 deficiency 03/31/2012   Depression    on meds--hx of   Diverticular disease    Emphysema of lung ([place])    HSV-2 (herpes simplex virus 2) infection    Hyperlipidemia    diet controlled   Hypertension    Mitral valve prolapse    Osteoporosis 01/14/2018   DEXA scan at [place] 01/14/18 T score -2.8 at Rt femur neck and L1-L4 AP spine   Substance abuse ([place])    Thoracic aortic ectasia (HCC)    Thyroid disease    on meds   Tobacco dependence    Vitamin D deficiency    Past Surgical History:  Procedure Laterality Date   COLONOSCOPY     D and C     TONSILLECTOMY     VARICOSE VEIN SURGERY  2000    Current Outpatient Medications  Medication Sig Dispense Refill   buPROPion (WELLBUTRIN XL) 150 MG 24 hr tablet Take 1 tablet (150 mg total) by mouth daily. 90 tablet 3   Cholecalciferol (VITAMIN D3 PO) Take 1 tablet by mouth  daily.     Cyanocobalamin (VITAMIN B-12 PO) Take 1 tablet by mouth daily.     levothyroxine (SYNTHROID) 50 MCG tablet TAKE 1 TABLET (50 MCG TOTAL) BY MOUTH DAILY. 90 tablet 3   MAGNESIUM PO Take 200 mg by mouth daily as needed.     Multiple Vitamin (MULTIVITAMINS PO) Take 1 tablet by mouth daily.     PARoxetine (PAXIL) 30 MG tablet Take 1 tablet (30 mg total) by mouth at bedtime. 90 tablet 3   rosuvastatin (CRESTOR) 20 MG tablet Take 20 mg by mouth daily.     traZODone (DESYREL) 50 MG tablet Take 1-2 tablets (50-100 mg total) by mouth at bedtime. 180 tablet 3   valACYclovir (VALTREX) 1000 MG tablet Take 1 tablet (1,000 mg total) by mouth  daily. (Patient taking differently: Take 500 mg by mouth daily.) 90 tablet 3   apixaban (ELIQUIS) 5 MG TABS tablet Take 1 tablet (5 mg total) by mouth 2 (two) times daily. 60 tablet 3   No current facility-administered medications for this encounter.    Allergies  Allergen Reactions   Codeine Itching    Social History   Socioeconomic History   Marital status: Divorced    Spouse name: Not on file   Number of children: 0   Years of education: Not on file   Highest education level: Professional school degree (e.g., MD, DDS, DVM, JD)  Occupational History   Occupation: Teacher    Employer: [employer]    Comment: Retired  Tobacco Use   Smoking status: Every Day    Packs/day: 0.50    Years: 43.00    Pack years: 21.50    Types: Cigarettes   Smokeless tobacco: Never   Tobacco comments:    3 cigarettes daily 10/27/2021  Vaping Use   Vaping Use: Never used  Substance and Sexual Activity   Alcohol use: No    Alcohol/week: 0.0 standard drinks   Drug use: No   Sexual activity: Never  Other Topics Concern   Not on file  Social History Narrative   Lives alone.    Social Determinants of Health   Financial Resource Strain: Not on file  Food Insecurity: Not on file  Transportation Needs: Not on file  Physical Activity: Not on file  Stress: Not on file  Social Connections: Not on file  Intimate Partner Violence: Not on file     ROS- All systems are reviewed and negative except as per the HPI above.  Physical Exam: Vitals:   10/27/21 1135  BP: 110/80  Pulse: 79  Weight: 51.6 kg  Height: 5\' 5"  (1.651 m)    GEN- The patient is a well appearing female, alert and oriented x 3 today.   Head- normocephalic, atraumatic Eyes-  Sclera clear, conjunctiva pink Ears- hearing intact Oropharynx- clear Neck- supple  Lungs- Clear to ausculation bilaterally, normal work of breathing Heart- Regular rate and rhythm, no murmurs, rubs or gallops  GI- soft,  NT, ND, + BS Extremities- no clubbing, cyanosis, or edema MS- no significant deformity or atrophy Skin- no rash or lesion Psych- euthymic mood, full affect Neuro- strength and sensation are intact  Wt Readings from Last 3 Encounters:  10/27/21 51.6 kg  08/21/21 49.9 kg  08/07/21 49.9 kg    EKG today demonstrates  SR Vent. rate 79 BPM PR interval 140 ms QRS duration 84 ms QT/QTcB 388/444 ms   Epic records are reviewed at length today  CHA2DS2-VASc Score = 3  The  patient's score is based upon: CHF History: 0 HTN History: 0 Diabetes History: 0 Stroke History: 0 Vascular Disease History: 1 (aortic atherosclerosis) Age Score: 1 Gender Score: 1      ASSESSMENT AND PLAN: 1. Paroxysmal Atrial Fibrillation (ICD10:  I48.0) The patient's CHA2DS2-VASc score is 3, indicating a 3.2% annual risk of stroke.   General education about afib provided and questions answered. We also discussed her stroke risk and the risks and benefits of anticoagulation. Patient converted back to SR. Check echocardiogram  Start Eliquis 5 mg BID  Will hold on starting diltiazem given borderline low BP readings at home.   2. Secondary Hypercoagulable State (ICD10:  D68.69) The patient is at significant risk for stroke/thromboembolism based upon her CHA2DS2-VASc Score of 3.  Start Apixaban (Eliquis).   3. CAD Coronary calcification in LAD and RCA noted on CT 10/06/21. On rosuvastatin   4. Dilated aorta Ascending aortic aneurysm 4.2 cm on CT, stable.  5. Mitral valve prolapse Check echocardiogram as above.    Follow up in the AF clinic in 2-4 weeks after echo. Will refer to establish care with primary cardiologist given coronary calcification and aortic atherosclerosis seen on CT.    [facility] [address] [place], [place] [address] [phone] 10/27/2021 1:32 PM

## 2021-10-27 NOTE — Patient Instructions (Signed)
Start Eliquis 5mg twice a day 

## 2021-10-29 ENCOUNTER — Telehealth (HOSPITAL_COMMUNITY): Payer: Self-pay | Admitting: *Deleted

## 2021-10-29 ENCOUNTER — Other Ambulatory Visit: Payer: Self-pay | Admitting: Registered Nurse

## 2021-10-29 ENCOUNTER — Ambulatory Visit
Admission: RE | Admit: 2021-10-29 | Discharge: 2021-10-29 | Disposition: A | Discharge status: Home / Self Care | Payer: Medicare Other | Source: Ambulatory Visit | Attending: Registered Nurse | Admitting: Registered Nurse

## 2021-10-29 DIAGNOSIS — R109 Unspecified abdominal pain: Secondary | ICD-10-CM

## 2021-10-29 DIAGNOSIS — R3129 Other microscopic hematuria: Secondary | ICD-10-CM | POA: Diagnosis not present

## 2021-10-29 DIAGNOSIS — R509 Fever, unspecified: Secondary | ICD-10-CM | POA: Diagnosis not present

## 2021-10-29 DIAGNOSIS — I480 Paroxysmal atrial fibrillation: Secondary | ICD-10-CM | POA: Diagnosis not present

## 2021-10-29 DIAGNOSIS — R319 Hematuria, unspecified: Secondary | ICD-10-CM | POA: Diagnosis not present

## 2021-10-29 NOTE — Telephone Encounter (Signed)
Patient called in stating she woke up this morning back in AF HR around 100. Pt does endorse having a low grade fever and "feeling like yuck" with an appt to see her PCP this morning. Discussed with Adline Peals PA recommends starting cardizem 120mg  once a day and call with update tomorrow. Pt verbalized agreement and understanding.

## 2021-11-04 ENCOUNTER — Ambulatory Visit (HOSPITAL_BASED_OUTPATIENT_CLINIC_OR_DEPARTMENT_OTHER)
Admission: RE | Admit: 2021-11-04 | Discharge: 2021-11-04 | Disposition: A | Discharge status: Home / Self Care | Payer: Medicare Other | Source: Ambulatory Visit

## 2021-11-04 ENCOUNTER — Emergency Department (HOSPITAL_COMMUNITY): Payer: Medicare Other

## 2021-11-04 ENCOUNTER — Other Ambulatory Visit: Payer: Self-pay

## 2021-11-04 ENCOUNTER — Inpatient Hospital Stay (HOSPITAL_COMMUNITY)
Admission: EM | Admit: 2021-11-04 | Discharge: 2021-11-05 | DRG: 300 | Disposition: A | Discharge status: Other Acute Inpatient Hospital | Payer: Medicare Other | Attending: Critical Care Medicine | Admitting: Critical Care Medicine

## 2021-11-04 DIAGNOSIS — J302 Other seasonal allergic rhinitis: Secondary | ICD-10-CM | POA: Diagnosis present

## 2021-11-04 DIAGNOSIS — I9788 Other intraoperative complications of the circulatory system, not elsewhere classified: Secondary | ICD-10-CM | POA: Diagnosis not present

## 2021-11-04 DIAGNOSIS — Z885 Allergy status to narcotic agent status: Secondary | ICD-10-CM

## 2021-11-04 DIAGNOSIS — F419 Anxiety disorder, unspecified: Secondary | ICD-10-CM | POA: Diagnosis present

## 2021-11-04 DIAGNOSIS — J951 Acute pulmonary insufficiency following thoracic surgery: Secondary | ICD-10-CM | POA: Diagnosis not present

## 2021-11-04 DIAGNOSIS — M1990 Unspecified osteoarthritis, unspecified site: Secondary | ICD-10-CM | POA: Diagnosis present

## 2021-11-04 DIAGNOSIS — I739 Peripheral vascular disease, unspecified: Secondary | ICD-10-CM | POA: Diagnosis not present

## 2021-11-04 DIAGNOSIS — I480 Paroxysmal atrial fibrillation: Secondary | ICD-10-CM | POA: Diagnosis present

## 2021-11-04 DIAGNOSIS — I7101 Dissection of ascending aorta: Secondary | ICD-10-CM

## 2021-11-04 DIAGNOSIS — Z79891 Long term (current) use of opiate analgesic: Secondary | ICD-10-CM | POA: Diagnosis not present

## 2021-11-04 DIAGNOSIS — Z79899 Other long term (current) drug therapy: Secondary | ICD-10-CM

## 2021-11-04 DIAGNOSIS — Z720 Tobacco use: Secondary | ICD-10-CM | POA: Diagnosis not present

## 2021-11-04 DIAGNOSIS — M810 Age-related osteoporosis without current pathological fracture: Secondary | ICD-10-CM | POA: Diagnosis present

## 2021-11-04 DIAGNOSIS — J449 Chronic obstructive pulmonary disease, unspecified: Secondary | ICD-10-CM | POA: Diagnosis not present

## 2021-11-04 DIAGNOSIS — K5790 Diverticulosis of intestine, part unspecified, without perforation or abscess without bleeding: Secondary | ICD-10-CM | POA: Diagnosis present

## 2021-11-04 DIAGNOSIS — D72829 Elevated white blood cell count, unspecified: Secondary | ICD-10-CM | POA: Diagnosis not present

## 2021-11-04 DIAGNOSIS — I7776 Dissection of artery of upper extremity: Secondary | ICD-10-CM | POA: Diagnosis not present

## 2021-11-04 DIAGNOSIS — I71019 Dissection of thoracic aorta, unspecified: Secondary | ICD-10-CM | POA: Diagnosis not present

## 2021-11-04 DIAGNOSIS — I341 Nonrheumatic mitral (valve) prolapse: Secondary | ICD-10-CM | POA: Diagnosis present

## 2021-11-04 DIAGNOSIS — I4891 Unspecified atrial fibrillation: Secondary | ICD-10-CM | POA: Diagnosis not present

## 2021-11-04 DIAGNOSIS — I71011 Dissection of aortic arch: Secondary | ICD-10-CM | POA: Diagnosis not present

## 2021-11-04 DIAGNOSIS — Z7901 Long term (current) use of anticoagulants: Secondary | ICD-10-CM

## 2021-11-04 DIAGNOSIS — B009 Herpesviral infection, unspecified: Secondary | ICD-10-CM | POA: Diagnosis present

## 2021-11-04 DIAGNOSIS — F17210 Nicotine dependence, cigarettes, uncomplicated: Secondary | ICD-10-CM | POA: Diagnosis present

## 2021-11-04 DIAGNOSIS — I700 Atherosclerosis of aorta: Secondary | ICD-10-CM | POA: Diagnosis present

## 2021-11-04 DIAGNOSIS — I951 Orthostatic hypotension: Secondary | ICD-10-CM | POA: Diagnosis not present

## 2021-11-04 DIAGNOSIS — Z4682 Encounter for fitting and adjustment of non-vascular catheter: Secondary | ICD-10-CM | POA: Diagnosis not present

## 2021-11-04 DIAGNOSIS — F418 Other specified anxiety disorders: Secondary | ICD-10-CM | POA: Diagnosis present

## 2021-11-04 DIAGNOSIS — R5381 Other malaise: Secondary | ICD-10-CM | POA: Diagnosis not present

## 2021-11-04 DIAGNOSIS — D62 Acute posthemorrhagic anemia: Secondary | ICD-10-CM | POA: Diagnosis not present

## 2021-11-04 DIAGNOSIS — I3139 Other pericardial effusion (noninflammatory): Secondary | ICD-10-CM | POA: Diagnosis not present

## 2021-11-04 DIAGNOSIS — Z20822 Contact with and (suspected) exposure to covid-19: Secondary | ICD-10-CM | POA: Diagnosis present

## 2021-11-04 DIAGNOSIS — I5032 Chronic diastolic (congestive) heart failure: Secondary | ICD-10-CM | POA: Diagnosis not present

## 2021-11-04 DIAGNOSIS — I7100 Dissection of unspecified site of aorta: Secondary | ICD-10-CM | POA: Diagnosis not present

## 2021-11-04 DIAGNOSIS — Z716 Tobacco abuse counseling: Secondary | ICD-10-CM

## 2021-11-04 DIAGNOSIS — I2510 Atherosclerotic heart disease of native coronary artery without angina pectoris: Secondary | ICD-10-CM | POA: Diagnosis present

## 2021-11-04 DIAGNOSIS — I10 Essential (primary) hypertension: Secondary | ICD-10-CM | POA: Diagnosis present

## 2021-11-04 DIAGNOSIS — I7121 Aneurysm of the ascending aorta, without rupture: Secondary | ICD-10-CM | POA: Diagnosis present

## 2021-11-04 DIAGNOSIS — I714 Abdominal aortic aneurysm, without rupture, unspecified: Secondary | ICD-10-CM | POA: Diagnosis not present

## 2021-11-04 DIAGNOSIS — J439 Emphysema, unspecified: Secondary | ICD-10-CM | POA: Diagnosis present

## 2021-11-04 DIAGNOSIS — J90 Pleural effusion, not elsewhere classified: Secondary | ICD-10-CM | POA: Diagnosis not present

## 2021-11-04 DIAGNOSIS — Z79890 Hormone replacement therapy (postmenopausal): Secondary | ICD-10-CM

## 2021-11-04 DIAGNOSIS — G479 Sleep disorder, unspecified: Secondary | ICD-10-CM | POA: Diagnosis not present

## 2021-11-04 DIAGNOSIS — D6859 Other primary thrombophilia: Secondary | ICD-10-CM | POA: Diagnosis present

## 2021-11-04 DIAGNOSIS — F32A Depression, unspecified: Secondary | ICD-10-CM | POA: Diagnosis not present

## 2021-11-04 DIAGNOSIS — I110 Hypertensive heart disease with heart failure: Secondary | ICD-10-CM | POA: Diagnosis not present

## 2021-11-04 DIAGNOSIS — E785 Hyperlipidemia, unspecified: Secondary | ICD-10-CM | POA: Diagnosis present

## 2021-11-04 DIAGNOSIS — E039 Hypothyroidism, unspecified: Secondary | ICD-10-CM | POA: Diagnosis present

## 2021-11-04 DIAGNOSIS — I97190 Other postprocedural cardiac functional disturbances following cardiac surgery: Secondary | ICD-10-CM | POA: Diagnosis not present

## 2021-11-04 DIAGNOSIS — I7102 Dissection of abdominal aorta: Secondary | ICD-10-CM | POA: Diagnosis not present

## 2021-11-04 DIAGNOSIS — I312 Hemopericardium, not elsewhere classified: Secondary | ICD-10-CM | POA: Diagnosis not present

## 2021-11-04 DIAGNOSIS — I351 Nonrheumatic aortic (valve) insufficiency: Secondary | ICD-10-CM | POA: Insufficient documentation

## 2021-11-04 DIAGNOSIS — J9811 Atelectasis: Secondary | ICD-10-CM | POA: Diagnosis not present

## 2021-11-04 LAB — CBC WITH DIFFERENTIAL/PLATELET
Abs Immature Granulocytes: 0.04 10*3/uL (ref 0.00–0.07)
Basophils Absolute: 0 10*3/uL (ref 0.0–0.1)
Basophils Relative: 1 %
Eosinophils Absolute: 0 10*3/uL (ref 0.0–0.5)
Eosinophils Relative: 1 %
HCT: 30.6 % — ABNORMAL LOW (ref 36.0–46.0)
Hemoglobin: 9.8 g/dL — ABNORMAL LOW (ref 12.0–15.0)
Immature Granulocytes: 1 %
Lymphocytes Relative: 21 %
Lymphs Abs: 1.3 10*3/uL (ref 0.7–4.0)
MCH: 29.4 pg (ref 26.0–34.0)
MCHC: 32 g/dL (ref 30.0–36.0)
MCV: 91.9 fL (ref 80.0–100.0)
Monocytes Absolute: 0.6 10*3/uL (ref 0.1–1.0)
Monocytes Relative: 9 %
Neutro Abs: 4.1 10*3/uL (ref 1.7–7.7)
Neutrophils Relative %: 67 %
Platelets: 302 10*3/uL (ref 150–400)
RBC: 3.33 MIL/uL — ABNORMAL LOW (ref 3.87–5.11)
RDW: 13.7 % (ref 11.5–15.5)
WBC: 6 10*3/uL (ref 4.0–10.5)
nRBC: 0 % (ref 0.0–0.2)

## 2021-11-04 LAB — COMPREHENSIVE METABOLIC PANEL
ALT: 17 U/L (ref 0–44)
AST: 19 U/L (ref 15–41)
Albumin: 3.1 g/dL — ABNORMAL LOW (ref 3.5–5.0)
Alkaline Phosphatase: 84 U/L (ref 38–126)
Anion gap: 10 (ref 5–15)
BUN: 16 mg/dL (ref 8–23)
CO2: 26 mmol/L (ref 22–32)
Calcium: 8.9 mg/dL (ref 8.9–10.3)
Chloride: 98 mmol/L (ref 98–111)
Creatinine, Ser: 0.56 mg/dL (ref 0.44–1.00)
GFR, Estimated: >60 mL/min (ref >60)
Glucose, Bld: 98 mg/dL (ref 70–99)
Potassium: 4.3 mmol/L (ref 3.5–5.1)
Sodium: 134 mmol/L — ABNORMAL LOW (ref 135–145)
Total Bilirubin: 0.5 mg/dL (ref 0.3–1.2)
Total Protein: 6.8 g/dL (ref 6.5–8.1)

## 2021-11-04 LAB — MRSA NEXT GEN BY PCR, NASAL: MRSA by PCR Next Gen: NOT DETECTED

## 2021-11-04 LAB — PROTIME-INR
INR: 1.6 — ABNORMAL HIGH (ref 0.8–1.2)
Prothrombin Time: 19.1 seconds — ABNORMAL HIGH (ref 11.4–15.2)

## 2021-11-04 LAB — TYPE AND SCREEN
ABO/RH(D): O NEG
Antibody Screen: NEGATIVE

## 2021-11-04 LAB — RESP PANEL BY RT-PCR (FLU A&B, COVID) ARPGX2
Influenza A by PCR: NEGATIVE
Influenza B by PCR: NEGATIVE
SARS Coronavirus 2 by RT PCR: NEGATIVE

## 2021-11-04 LAB — ECHOCARDIOGRAM COMPLETE
Area-P 1/2: 4.15 cm2
S' Lateral: 2.6 cm

## 2021-11-04 LAB — ABO/RH: ABO/RH(D): O NEG

## 2021-11-04 LAB — TROPONIN I (HIGH SENSITIVITY)
Troponin I (High Sensitivity): 4 ng/L (ref <18)
Troponin I (High Sensitivity): 5 ng/L (ref <18)

## 2021-11-04 MED ORDER — ESMOLOL HCL-SODIUM CHLORIDE 2000 MG/100ML IV SOLN
25.0000 ug/kg/min | INTRAVENOUS | Status: DC
  Administered 2021-11-04: 275 ug/kg/min via INTRAVENOUS
  Administered 2021-11-04: 300 ug/kg/min via INTRAVENOUS
  Administered 2021-11-04: 200 ug/kg/min via INTRAVENOUS
  Administered 2021-11-04: 25 ug/kg/min via INTRAVENOUS
  Administered 2021-11-05: 125 ug/kg/min via INTRAVENOUS
  Administered 2021-11-05: 150 ug/kg/min via INTRAVENOUS
  Filled 2021-11-04 (×9): qty 100

## 2021-11-04 MED ORDER — FENTANYL CITRATE PF 50 MCG/ML IJ SOSY
25.0000 ug | PREFILLED_SYRINGE | INTRAMUSCULAR | Status: DC | PRN
Start: 2021-11-04 — End: 2021-11-05

## 2021-11-04 MED ORDER — EMPTY CONTAINERS FLEXIBLE MISC
900.0000 mg | Freq: Once | Status: AC
  Administered 2021-11-04: 900 mg via INTRAVENOUS
  Filled 2021-11-04: qty 90

## 2021-11-04 MED ORDER — TRAZODONE HCL 50 MG PO TABS
50.0000 mg | ORAL_TABLET | Freq: Every evening | ORAL | Status: DC | PRN
  Administered 2021-11-04: 50 mg via ORAL
  Filled 2021-11-04: qty 1

## 2021-11-04 MED ORDER — VALACYCLOVIR HCL 500 MG PO TABS
500.0000 mg | ORAL_TABLET | Freq: Every day | ORAL | Status: DC
  Administered 2021-11-04: 500 mg via ORAL
  Filled 2021-11-04 (×2): qty 1

## 2021-11-04 MED ORDER — POLYETHYLENE GLYCOL 3350 17 G PO PACK: 17.0000 g | PACK | Freq: Every day | ORAL | Status: DC | PRN

## 2021-11-04 MED ORDER — IOHEXOL 350 MG/ML SOLN
100.0000 mL | Freq: Once | INTRAVENOUS | Status: AC | PRN
  Administered 2021-11-04: 100 mL via INTRAVENOUS

## 2021-11-04 MED ORDER — DOCUSATE SODIUM 100 MG PO CAPS: 100.0000 mg | ORAL_CAPSULE | Freq: Two times a day (BID) | ORAL | Status: DC | PRN

## 2021-11-04 NOTE — Consult Note (Addendum)
Cardiology Consult Note:   Patient ID: Kristine Davidson MRN: [id number]; DOB: 01/14/1951   Admission date: 11/04/2021  PCP:  Michael Boston, MD   [facility] Providers Cardiologist:  New to [facility]  Chief Complaint:  Incidental finding of aortic dissection  Patient Profile:   Kristine Davidson is a 70 y.o. female with TAA, Aortic atherosclerosis, Tobacco use, and new AF who is being seen 11/04/2021 for the evaluation of a new ascending aortic dissection.  History of Present Illness:   Ms. Hunger was feeling well until she had sudden onset "esophageal pain."  This was a 10/10 pain that did not improve with TUMS previous.  She did not seek ED care but when to her PCP.  Was found to have incidental AF, sent to AF clinic.  Received samples of DOAC for a CHADSVASC of 2 and had Echo this AM.  On Echo there was an moderate circumferential pericardial effusion and an aortic dissection that appears from above the SoV to the Aortic Arch before the great vessels (with mild central AI and on other valve abnormalities).  Had CT Aorta after being emergently sent to ED with this finding- anatomy confirmed and max Aortic size 47 mm.  Both this AM and this PM, has no CP, SOB  palpitations.  No esophageal pain. No back pain.  Overall, patient feels well.   Past Medical History:  Diagnosis Date   Allergy    seasonal allergies   Anemia    hx of    Anxiety    on meds   Aortic atherosclerosis (HCC)    Arthritis    generalized   B12 deficiency 03/31/2012   Depression    on meds--hx of   Diverticular disease    Emphysema of lung ([place])    HSV-2 (herpes simplex virus 2) infection    Hyperlipidemia    diet controlled   Hypertension    Mitral valve prolapse    Osteoporosis 01/14/2018   DEXA scan at [place] 01/14/18 T score -2.8 at Rt femur neck and L1-L4 AP spine   Substance abuse ([place])    Thoracic aortic ectasia (HCC)    Thyroid disease    on meds   Tobacco dependence    Vitamin D  deficiency     Past Surgical History:  Procedure Laterality Date   COLONOSCOPY     D and C     TONSILLECTOMY     VARICOSE VEIN SURGERY  2000     Medications Prior to Admission: Prior to Admission medications   Medication Sig Start Date End Date Taking? Authorizing Provider  acetaminophen (TYLENOL) 325 MG tablet Take 325-650 mg by mouth every 6 (six) hours as needed for fever or mild pain.   Yes [provider]  apixaban (ELIQUIS) 5 MG TABS tablet Take 1 tablet (5 mg total) by mouth 2 (two) times daily. 10/27/21  Yes Fenton, Clint R, PA  buPROPion (WELLBUTRIN XL) 150 MG 24 hr tablet Take 1 tablet (150 mg total) by mouth daily. Patient taking differently: Take 150 mg by mouth in the morning. 12/07/19  Yes Wendie Agreste, MD  diltiazem (CARDIZEM CD) 120 MG 24 hr capsule Take 120 mg by mouth in the morning.   Yes [provider]  levothyroxine (SYNTHROID) 50 MCG tablet TAKE 1 TABLET (50 MCG TOTAL) BY MOUTH DAILY. Patient taking differently: Take 50 mcg by mouth at bedtime. 12/07/19  Yes Wendie Agreste, MD  MAGNESIUM PO Take 200 mg by mouth  daily as needed (for constipation).   Yes [provider]  PARoxetine (PAXIL) 30 MG tablet Take 1 tablet (30 mg total) by mouth at bedtime. Patient taking differently: Take 30 mg by mouth in the morning. 12/07/19  Yes Wendie Agreste, MD  rosuvastatin (CRESTOR) 20 MG tablet Take 20 mg by mouth in the morning and at bedtime.   Yes [provider]  traZODone (DESYREL) 50 MG tablet Take 1-2 tablets (50-100 mg total) by mouth at bedtime. Patient taking differently: Take 50 mg by mouth at bedtime. 12/07/19  Yes Wendie Agreste, MD  valACYclovir (VALTREX) 1000 MG tablet Take 1 tablet (1,000 mg total) by mouth daily. Patient taking differently: Take 500 mg by mouth at bedtime. 12/07/19  Yes Wendie Agreste, MD     Allergies:    Allergies  Allergen Reactions   Codeine Itching    Social History:   Social  History   Socioeconomic History   Marital status: Divorced    Spouse name: Not on file   Number of children: 0   Years of education: Not on file   Highest education level: Professional school degree (e.g., MD, DDS, DVM, JD)  Occupational History   Occupation: Product manager: [employer]    Comment: Retired  Tobacco Use   Smoking status: Every Day    Packs/day: 0.50    Years: 43.00    Pack years: 21.50    Types: Cigarettes   Smokeless tobacco: Never   Tobacco comments:    3 cigarettes daily 10/27/2021  Vaping Use   Vaping Use: Never used  Substance and Sexual Activity   Alcohol use: No    Alcohol/week: 0.0 standard drinks   Drug use: No   Sexual activity: Never  Other Topics Concern   Not on file  Social History Narrative   Lives alone.    Social Determinants of Health   Financial Resource Strain: Not on file  Food Insecurity: Not on file  Transportation Needs: Not on file  Physical Activity: Not on file  Stress: Not on file  Social Connections: Not on file  Intimate Partner Violence: Not on file    Family History:   The patient's family history includes Breast cancer (age of onset: 21) in her mother; Diabetes in her sister; Macular degeneration in her mother; Parkinson's disease in her father and sister; Stroke in her mother. There is no history of Colon polyps, Colon cancer, Esophageal cancer, Stomach cancer, or Rectal cancer.   No history of aortopathy, dissection or SCD in the family.  ROS:  Please see the history of present illness.  All other ROS reviewed and negative.     Physical Exam/Data:   Vitals:   11/04/21 1545 11/04/21 1600 11/04/21 1615 11/04/21 1631  BP: 123/81 122/78 115/80   Pulse: 68 (!) 55 66   Resp: 19 14 16    Temp:    98.2 F (36.8 C)  TempSrc:    Oral  SpO2: 96% 96% 98%    No intake or output data in the 24 hours ending 11/04/21 1810 Last 3 Weights 10/27/2021 08/21/2021 08/07/2021  Weight (lbs) 113 lb 12.8  oz 110 lb 110 lb  Weight (kg) 51.619 kg 49.896 kg 49.896 kg     There is no height or weight on file to calculate BMI.   Gen: no distress, Frail thin woman   Neck: No JVD, no carotid bruit Cardiac: No Rubs or Gallops, II/VI holodiastolic Murmur, normal rate, +  2 radial pulses Respiratory: Clear to auscultation bilaterally, normal effort, normal  respiratory rate GI: Soft, nontender, non-distended  MS: No  edema;  moves all extremities Integument: Skin feels warm Neuro:  At time of evaluation, alert and oriented to person/place/time/situation  Psych: Normal affect, patient feels anxious   EKG:  The ECG that was done  was personally reviewed and demonstrates Low voltage SR rate 78  Laboratory Data:  High Sensitivity Troponin:   Recent Labs  Lab 11/04/21 1212 11/04/21 1409  TROPONINIHS 4 5      Chemistry Recent Labs  Lab 11/04/21 1212  NA 134*  K 4.3  CL 98  CO2 26  GLUCOSE 98  BUN 16  CREATININE 0.56  CALCIUM 8.9  GFRNONAA >60  ANIONGAP 10    Recent Labs  Lab 11/04/21 1212  PROT 6.8  ALBUMIN 3.1*  AST 19  ALT 17  ALKPHOS 84  BILITOT 0.5   Lipids No results for input(s): CHOL, TRIG, HDL, LABVLDL, LDLCALC, CHOLHDL in the last 168 hours. Hematology Recent Labs  Lab 11/04/21 1212  WBC 6.0  RBC 3.33*  HGB 9.8*  HCT 30.6*  MCV 91.9  MCH 29.4  MCHC 32.0  RDW 13.7  PLT 302   Thyroid No results for input(s): TSH, FREET4 in the last 168 hours. BNPNo results for input(s): BNP, PROBNP in the last 168 hours.  DDimer No results for input(s): DDIMER in the last 168 hours.   Radiology/Studies:  ECHOCARDIOGRAM COMPLETE  Result Date: 11/04/2021    ECHOCARDIOGRAM REPORT   Patient Name:   YARIMAR LAVIS Date of Exam: 11/04/2021 Medical Rec #:  [id number]        Height:       65.0 in Accession #:    [id number]       Weight:       113.8 lb Date of Birth:  05/28/1951         BSA:          1.557 m Patient Age:    49 years         BP:           139/77 mmHg Patient  Gender: F                HR:           74 bpm. Exam Location:  Outpatient Procedure: 2D Echo STAT ECHO Indications:    atrial fibrillation  History:        Patient has no prior history of Echocardiogram examinations.                 Suspected aortic dissection.  Sonographer:    Johny Chess RDCS Referring Phys: [id number] [place]  1. The mitral valve is normal in structure. No evidence of mitral valve regurgitation. No evidence of mitral stenosis.  2. Moderate pericardial effusion. The pericardial effusion is circumferential.  3. The inferior vena cava is dilated in size with <50% respiratory variability, suggesting right atrial pressure of 15 mmHg.  4. There is evidence of an aortic dissection with a dissection flap above the S inus of Valsalva. This begins to extendin to the aortic arch.  5. Left ventricular ejection fraction, by estimation, is 60 to 65%. The left ventricle has normal function. The left ventricle has no regional wall motion abnormalities. There is mild concentric left ventricular hypertrophy. Left ventricular diastolic parameters are consistent with Grade II diastolic dysfunction (pseudonormalization).  6. Right ventricular systolic function is  normal. The right ventricular size is normal. There is mildly elevated pulmonary artery systolic pressure. The estimated right ventricular systolic pressure is 34.1 mmHg.  7. The aortic valve is tricuspid. Aortic valve regurgitation is mild. No aortic stenosis is present. Comparison(s): No prior Echocardiogram. Conclusion(s)/Recommendation(s): Emergent finding: Patient sent to ED for stat imaging. Discussed with Patient, CT surgery, ED receiving team. FINDINGS  Left Ventricle: Left ventricular ejection fraction, by estimation, is 60 to 65%. The left ventricle has normal function. The left ventricle has no regional wall motion abnormalities. The left ventricular internal cavity size was normal in size. There is  mild concentric left  ventricular hypertrophy. Left ventricular diastolic parameters are consistent with Grade II diastolic dysfunction (pseudonormalization). Right Ventricle: The right ventricular size is normal. No increase in right ventricular wall thickness. Right ventricular systolic function is normal. There is mildly elevated pulmonary artery systolic pressure. The tricuspid regurgitant velocity is 2.47  m/s, and with an assumed right atrial pressure of 15 mmHg, the estimated right ventricular systolic pressure is 93.7 mmHg. Left Atrium: Left atrial size was normal in size. Right Atrium: Right atrial size was normal in size. Pericardium: A moderately sized pericardial effusion is present. The pericardial effusion is circumferential. Mitral Valve: The mitral valve is normal in structure. No evidence of mitral valve regurgitation. No evidence of mitral valve stenosis. Tricuspid Valve: The tricuspid valve is normal in structure. Tricuspid valve regurgitation is mild. Aortic Valve: The aortic valve is tricuspid. Aortic valve regurgitation is mild. No aortic stenosis is present. Pulmonic Valve: The pulmonic valve was grossly normal. Pulmonic valve regurgitation is trivial. No evidence of pulmonic stenosis. Aorta: There is evidence of an aortic dissection with a dissection flap above the S inus of Valsalva. This begins to extendin to the aortic arch. The aortic root is normal in size and structure. Venous: The inferior vena cava is dilated in size with less than 50% respiratory variability, suggesting right atrial pressure of 15 mmHg. IAS/Shunts: The atrial septum is grossly normal.  LEFT VENTRICLE PLAX 2D LVIDd:         3.90 cm   Diastology LVIDs:         2.60 cm   LV e' medial:    4.35 cm/s LV PW:         1.10 cm   LV E/e' medial:  20.7 LV IVS:        1.00 cm   LV e' lateral:   9.36 cm/s LVOT diam:     1.80 cm   LV E/e' lateral: 9.6 LV SV:         47 LV SV Index:   30 LVOT Area:     2.54 cm  RIGHT VENTRICLE         IVC TAPSE  (M-mode): 1.9 cm  IVC diam: 3.20 cm LEFT ATRIUM             Index        RIGHT ATRIUM           Index LA diam:        3.10 cm 1.99 cm/m   RA Area:     16.80 cm LA Vol (A2C):   68.5 ml 44.01 ml/m  RA Volume:   46.30 ml  29.74 ml/m LA Vol (A4C):   47.3 ml 30.39 ml/m LA Biplane Vol: 58.4 ml 37.52 ml/m  AORTIC VALVE LVOT Vmax:   100.00 cm/s LVOT Vmean:  66.400 cm/s LVOT VTI:    0.186 m  AORTA  Ao Root diam: 3.50 cm MITRAL VALVE               TRICUSPID VALVE MV Area (PHT): 4.15 cm    TR Peak grad:   24.4 mmHg MV Decel Time: 183 msec    TR Vmax:        247.00 cm/s MV E velocity: 90.00 cm/s MV A velocity: 85.70 cm/s  SHUNTS MV E/A ratio:  1.05        Systemic VTI:  0.19 m                            Systemic Diam: 1.80 cm Rudean Haskell MD Electronically signed by Rudean Haskell MD Signature Date/Time: 11/04/2021/12:19:24 PM    Final    CT Angio Chest/Abd/Pel for Dissection W and/or Wo Contrast  Result Date: 11/04/2021 CLINICAL DATA:  Abdominal pain, aortic dissection suspected EXAM: CT ANGIOGRAPHY CHEST, ABDOMEN AND PELVIS TECHNIQUE: Non-contrast CT of the chest was initially obtained. Multidetector CT imaging through the chest, abdomen and pelvis was performed using the standard protocol during bolus administration of intravenous contrast. Multiplanar reconstructed images and MIPs were obtained and reviewed to evaluate the vascular anatomy. CONTRAST:  113mL OMNIPAQUE IOHEXOL 350 MG/ML SOLN COMPARISON:  None. FINDINGS: CTA CHEST FINDINGS Cardiovascular: There is a new dissection of the thoracic aorta extending from the ascending aorta to the mid arch. No extension into the descending portion. Great vessel origins are spared. Ascending aorta measures up to 4.7 cm. Cardiac size is increased. There is a new moderate pericardial effusion. No evidence of central pulmonary embolism. Mediastinum/Nodes: No enlarged nodes. Thyroid and esophagus are unremarkable. Lungs/Pleura: No consolidation or mass. Minor  subsegmental atelectasis/scarring. Musculoskeletal: No acute osseous abnormality. Review of the MIP images confirms the above findings. CTA ABDOMEN AND PELVIS FINDINGS VASCULAR Aorta: Normal in caliber.  No evidence of dissection.  Mixed plaque. Celiac: Patent without origin stenosis. SMA: Patent without origin stenosis. Renals: Patent without origin stenosis. IMA: Patent without origin stenosis. Inflow: Patent and normal in caliber.  Calcified plaque. Veins: Not well evaluated. Review of the MIP images confirms the above findings. NON-VASCULAR Hepatobiliary: No focal liver abnormality is seen. No gallstones, gallbladder wall thickening, or biliary dilatation. Pancreas: Unremarkable. Spleen: Unremarkable. Adrenals/Urinary Tract: Adrenals, kidneys, and bladder are unremarkable. Stomach/Bowel: Stomach is within normal limits. Bowel is normal in caliber. Lymphatic: No enlarged nodes. Reproductive: Unremarkable. Other: No abdominal wall hernia. Musculoskeletal: No acute osseous abnormality. Review of the MIP images confirms the above findings. IMPRESSION: Type A thoracic aortic dissection extending to the mid arch. Ascending thoracic aorta measures up to 4.7 cm. Great vessel origins are spared. New cardiomegaly and moderate pericardial effusion. These results were called by telephone at the time of interpretation on 11/04/2021 at 12:54 pm to provider [facility] , who verbally acknowledged these results. Electronically Signed   By: Macy Mis M.D.   On: 11/04/2021 13:04     Assessment and Plan:   Ascending Aortic Aneurysm with Dissection and pericardial effusion Aortic atherosclerosis Tobacco Abuse Paroxsymal AF CHADSVASC 2- presently SR - Discussed with CT Surgery; will need 5 days eliquis washout unless emergent surgery - will need coronary assessment given prior CAC, age, and risk factors: will order CCTA for tomorrow without additional BB:  patient is to be on esmolol drip per PCCM and we will not  administer Nitro until after heart rate is controlled to prevent shear stress on the Aorta; CCTA is per CT Surgery  Recs - No DOAC at this time; unclear duration of asymptomatic AF; MAZE could be considered at time of surgery - we discussed smoking cessation, LDL goal of at leave < 70 given her vascular disease risk factors; lipids added on to AM  Discussed diagnosis with patient and family   For questions or updates, please contact [place] Please consult [email or web address] for contact info under     Signed, Werner Lean, MD  11/04/2021 6:10 PM

## 2021-11-04 NOTE — ED Provider Notes (Signed)
[facility] EMERGENCY DEPARTMENT Provider Note   CSN: [id number] Arrival date & time: 11/04/21  1201     History Chief Complaint  Patient presents with   Thoracic Aortic Dissection    Kristine Davidson is a 70 y.o. female.  Presents to ER with concern for thoracic aortic dissection.  Patient reports that approximately 1 week ago she had an episode lasting approximately 12 hours of severe chest pain.  Since that time she has not had any more chest pain.  Went to A. fib clinic on 11/7 and was started on Eliquis.  Patient was obtaining an outpatient echocardiogram today and the cardiologist was concerned for possible dissection and sent patient immediately to ER for further work-up.  Patient currently does not have any pain and has no complaints.  Additional history obtained from chart review.  HPI     Past Medical History:  Diagnosis Date   Allergy    seasonal allergies   Anemia    hx of    Anxiety    on meds   Aortic atherosclerosis (HCC)    Arthritis    generalized   B12 deficiency 03/31/2012   Depression    on meds--hx of   Diverticular disease    Emphysema of lung ([place])    HSV-2 (herpes simplex virus 2) infection    Hyperlipidemia    diet controlled   Hypertension    Mitral valve prolapse    Osteoporosis 01/14/2018   DEXA scan at [facility] 01/14/18 T score -2.8 at Rt femur neck and L1-L4 AP spine   Substance abuse ([place])    Thoracic aortic ectasia (HCC)    Thyroid disease    on meds   Tobacco dependence    Vitamin D deficiency     Patient Active Problem List   Diagnosis Date Noted   Aortic dissection ([place]) 11/04/2021   Paroxysmal atrial fibrillation ([place]) 10/27/2021   Secondary hypercoagulable state ([place]) 10/27/2021   Mitral valve prolapse 07/23/2020   Osteoporosis 01/14/2018   Anemia 10/29/2016   Depression with anxiety 02/28/2015   Hypothyroidism 05/17/2014   HSV-2 (herpes simplex virus 2) infection 03/31/2012   Vitamin D  deficiency disease 03/31/2012   H/O diverticulitis of colon 02/17/2012   Hypothyroid 02/17/2012    Past Surgical History:  Procedure Laterality Date   COLONOSCOPY     D and C     TONSILLECTOMY     VARICOSE VEIN SURGERY  2000     OB History   No obstetric history on file.     Family History  Problem Relation Age of Onset   Stroke Mother    Macular degeneration Mother    Breast cancer Mother 83   Parkinson's disease Father    Parkinson's disease Sister    Diabetes Sister    Colon polyps Neg Hx    Colon cancer Neg Hx    Esophageal cancer Neg Hx    Stomach cancer Neg Hx    Rectal cancer Neg Hx     Social History   Tobacco Use   Smoking status: Every Day    Packs/day: 0.50    Years: 43.00    Pack years: 21.50    Types: Cigarettes   Smokeless tobacco: Never   Tobacco comments:    3 cigarettes daily 10/27/2021  Vaping Use   Vaping Use: Never used  Substance Use Topics   Alcohol use: No    Alcohol/week: 0.0 standard drinks   Drug use: No  Home Medications Prior to Admission medications   Medication Sig Start Date End Date Taking? Authorizing Provider  acetaminophen (TYLENOL) 325 MG tablet Take 325-650 mg by mouth every 6 (six) hours as needed for fever or mild pain.   Yes [provider]  apixaban (ELIQUIS) 5 MG TABS tablet Take 1 tablet (5 mg total) by mouth 2 (two) times daily. 10/27/21  Yes Fenton, Clint R, PA  buPROPion (WELLBUTRIN XL) 150 MG 24 hr tablet Take 1 tablet (150 mg total) by mouth daily. Patient taking differently: Take 150 mg by mouth in the morning. 12/07/19  Yes Wendie Agreste, MD  diltiazem (CARDIZEM CD) 120 MG 24 hr capsule Take 120 mg by mouth in the morning.   Yes [provider]  levothyroxine (SYNTHROID) 50 MCG tablet TAKE 1 TABLET (50 MCG TOTAL) BY MOUTH DAILY. Patient taking differently: Take 50 mcg by mouth at bedtime. 12/07/19  Yes Wendie Agreste, MD  MAGNESIUM PO Take 200 mg by mouth daily as needed (for  constipation).   Yes [provider]  PARoxetine (PAXIL) 30 MG tablet Take 1 tablet (30 mg total) by mouth at bedtime. Patient taking differently: Take 30 mg by mouth in the morning. 12/07/19  Yes Wendie Agreste, MD  rosuvastatin (CRESTOR) 20 MG tablet Take 20 mg by mouth in the morning and at bedtime.   Yes [provider]  traZODone (DESYREL) 50 MG tablet Take 1-2 tablets (50-100 mg total) by mouth at bedtime. Patient taking differently: Take 50 mg by mouth at bedtime. 12/07/19  Yes Wendie Agreste, MD  valACYclovir (VALTREX) 1000 MG tablet Take 1 tablet (1,000 mg total) by mouth daily. Patient taking differently: Take 500 mg by mouth at bedtime. 12/07/19  Yes Wendie Agreste, MD    Allergies    Codeine  Review of Systems   Review of Systems  Constitutional:  Negative for chills and fever.  HENT:  Negative for ear pain and sore throat.   Eyes:  Negative for pain and visual disturbance.  Respiratory:  Negative for cough and shortness of breath.   Cardiovascular:  Negative for chest pain and palpitations.  Gastrointestinal:  Negative for abdominal pain and vomiting.  Genitourinary:  Negative for dysuria and hematuria.  Musculoskeletal:  Negative for arthralgias and back pain.  Skin:  Negative for color change and rash.  Neurological:  Negative for seizures and syncope.  All other systems reviewed and are negative.  Physical Exam Updated Vital Signs BP 121/86   Pulse 69   Temp 98.7 F (37.1 C) (Oral)   Resp 20   SpO2 97%   Physical Exam Vitals and nursing note reviewed.  Constitutional:      General: She is not in acute distress.    Appearance: She is well-developed.  HENT:     Head: Normocephalic and atraumatic.  Eyes:     Conjunctiva/sclera: Conjunctivae normal.  Cardiovascular:     Rate and Rhythm: Normal rate and regular rhythm.     Heart sounds: No murmur heard. Pulmonary:     Effort: Pulmonary effort is normal. No respiratory distress.      Breath sounds: Normal breath sounds.  Abdominal:     Palpations: Abdomen is soft.     Tenderness: There is no abdominal tenderness.  Musculoskeletal:     Cervical back: Neck supple.  Skin:    General: Skin is warm and dry.  Neurological:     General: No focal deficit present.     Mental  Status: She is alert.  Psychiatric:        Mood and Affect: Mood normal.        Behavior: Behavior normal.    ED Results / Procedures / Treatments   Labs (all labs ordered are listed, but only abnormal results are displayed) Labs Reviewed  CBC WITH DIFFERENTIAL/PLATELET - Abnormal; Notable for the following components:      Result Value   RBC 3.33 (*)    Hemoglobin 9.8 (*)    HCT 30.6 (*)    All other components within normal limits  COMPREHENSIVE METABOLIC PANEL - Abnormal; Notable for the following components:   Sodium 134 (*)    Albumin 3.1 (*)    All other components within normal limits  PROTIME-INR - Abnormal; Notable for the following components:   Prothrombin Time 19.1 (*)    INR 1.6 (*)    All other components within normal limits  RESP PANEL BY RT-PCR (FLU A&B, COVID) ARPGX2  TYPE AND SCREEN  ABO/RH  TROPONIN I (HIGH SENSITIVITY)  TROPONIN I (HIGH SENSITIVITY)    EKG None  Radiology ECHOCARDIOGRAM COMPLETE  Result Date: 11/04/2021    ECHOCARDIOGRAM REPORT   Patient Name:   Kristine Davidson Date of Exam: 11/04/2021 Medical Rec #:  [id number]        Height:       65.0 in Accession #:    [id number]       Weight:       113.8 lb Date of Birth:  [birth year]         BSA:          1.557 m Patient Age:    4 years         BP:           139/77 mmHg Patient Gender: F                HR:           74 bpm. Exam Location:  Outpatient Procedure: 2D Echo STAT ECHO Indications:    atrial fibrillation  History:        Patient has no prior history of Echocardiogram examinations.                 Suspected aortic dissection.  Sonographer:    Johny Chess RDCS Referring Phys: [id number] [place]  1. The mitral valve is normal in structure. No evidence of mitral valve regurgitation. No evidence of mitral stenosis.  2. Moderate pericardial effusion. The pericardial effusion is circumferential.  3. The inferior vena cava is dilated in size with <50% respiratory variability, suggesting right atrial pressure of 15 mmHg.  4. There is evidence of an aortic dissection with a dissection flap above the S inus of Valsalva. This begins to extendin to the aortic arch.  5. Left ventricular ejection fraction, by estimation, is 60 to 65%. The left ventricle has normal function. The left ventricle has no regional wall motion abnormalities. There is mild concentric left ventricular hypertrophy. Left ventricular diastolic parameters are consistent with Grade II diastolic dysfunction (pseudonormalization).  6. Right ventricular systolic function is normal. The right ventricular size is normal. There is mildly elevated pulmonary artery systolic pressure. The estimated right ventricular systolic pressure is 27.7 mmHg.  7. The aortic valve is tricuspid. Aortic valve regurgitation is mild. No aortic stenosis is present. Comparison(s): No prior Echocardiogram. Conclusion(s)/Recommendation(s): Emergent finding: Patient sent to ED for stat imaging. Discussed with Patient, CT surgery, ED receiving  team. FINDINGS  Left Ventricle: Left ventricular ejection fraction, by estimation, is 60 to 65%. The left ventricle has normal function. The left ventricle has no regional wall motion abnormalities. The left ventricular internal cavity size was normal in size. There is  mild concentric left ventricular hypertrophy. Left ventricular diastolic parameters are consistent with Grade II diastolic dysfunction (pseudonormalization). Right Ventricle: The right ventricular size is normal. No increase in right ventricular wall thickness. Right ventricular systolic function is normal. There is mildly elevated pulmonary artery systolic  pressure. The tricuspid regurgitant velocity is 2.47  m/s, and with an assumed right atrial pressure of 15 mmHg, the estimated right ventricular systolic pressure is 09.3 mmHg. Left Atrium: Left atrial size was normal in size. Right Atrium: Right atrial size was normal in size. Pericardium: A moderately sized pericardial effusion is present. The pericardial effusion is circumferential. Mitral Valve: The mitral valve is normal in structure. No evidence of mitral valve regurgitation. No evidence of mitral valve stenosis. Tricuspid Valve: The tricuspid valve is normal in structure. Tricuspid valve regurgitation is mild. Aortic Valve: The aortic valve is tricuspid. Aortic valve regurgitation is mild. No aortic stenosis is present. Pulmonic Valve: The pulmonic valve was grossly normal. Pulmonic valve regurgitation is trivial. No evidence of pulmonic stenosis. Aorta: There is evidence of an aortic dissection with a dissection flap above the S inus of Valsalva. This begins to extendin to the aortic arch. The aortic root is normal in size and structure. Venous: The inferior vena cava is dilated in size with less than 50% respiratory variability, suggesting right atrial pressure of 15 mmHg. IAS/Shunts: The atrial septum is grossly normal.  LEFT VENTRICLE PLAX 2D LVIDd:         3.90 cm   Diastology LVIDs:         2.60 cm   LV e' medial:    4.35 cm/s LV PW:         1.10 cm   LV E/e' medial:  20.7 LV IVS:        1.00 cm   LV e' lateral:   9.36 cm/s LVOT diam:     1.80 cm   LV E/e' lateral: 9.6 LV SV:         47 LV SV Index:   30 LVOT Area:     2.54 cm  RIGHT VENTRICLE         IVC TAPSE (M-mode): 1.9 cm  IVC diam: 3.20 cm LEFT ATRIUM             Index        RIGHT ATRIUM           Index LA diam:        3.10 cm 1.99 cm/m   RA Area:     16.80 cm LA Vol (A2C):   68.5 ml 44.01 ml/m  RA Volume:   46.30 ml  29.74 ml/m LA Vol (A4C):   47.3 ml 30.39 ml/m LA Biplane Vol: 58.4 ml 37.52 ml/m  AORTIC VALVE LVOT Vmax:   100.00 cm/s  LVOT Vmean:  66.400 cm/s LVOT VTI:    0.186 m  AORTA Ao Root diam: 3.50 cm MITRAL VALVE               TRICUSPID VALVE MV Area (PHT): 4.15 cm    TR Peak grad:   24.4 mmHg MV Decel Time: 183 msec    TR Vmax:        247.00 cm/s MV E velocity: 90.00 cm/s  MV A velocity: 85.70 cm/s  SHUNTS MV E/A ratio:  1.05        Systemic VTI:  0.19 m                            Systemic Diam: 1.80 cm Rudean Haskell MD Electronically signed by Rudean Haskell MD Signature Date/Time: 11/04/2021/12:19:24 PM    Final    CT Angio Chest/Abd/Pel for Dissection W and/or Wo Contrast  Result Date: 11/04/2021 CLINICAL DATA:  Abdominal pain, aortic dissection suspected EXAM: CT ANGIOGRAPHY CHEST, ABDOMEN AND PELVIS TECHNIQUE: Non-contrast CT of the chest was initially obtained. Multidetector CT imaging through the chest, abdomen and pelvis was performed using the standard protocol during bolus administration of intravenous contrast. Multiplanar reconstructed images and MIPs were obtained and reviewed to evaluate the vascular anatomy. CONTRAST:  147mL OMNIPAQUE IOHEXOL 350 MG/ML SOLN COMPARISON:  None. FINDINGS: CTA CHEST FINDINGS Cardiovascular: There is a new dissection of the thoracic aorta extending from the ascending aorta to the mid arch. No extension into the descending portion. Great vessel origins are spared. Ascending aorta measures up to 4.7 cm. Cardiac size is increased. There is a new moderate pericardial effusion. No evidence of central pulmonary embolism. Mediastinum/Nodes: No enlarged nodes. Thyroid and esophagus are unremarkable. Lungs/Pleura: No consolidation or mass. Minor subsegmental atelectasis/scarring. Musculoskeletal: No acute osseous abnormality. Review of the MIP images confirms the above findings. CTA ABDOMEN AND PELVIS FINDINGS VASCULAR Aorta: Normal in caliber.  No evidence of dissection.  Mixed plaque. Celiac: Patent without origin stenosis. SMA: Patent without origin stenosis. Renals: Patent without  origin stenosis. IMA: Patent without origin stenosis. Inflow: Patent and normal in caliber.  Calcified plaque. Veins: Not well evaluated. Review of the MIP images confirms the above findings. NON-VASCULAR Hepatobiliary: No focal liver abnormality is seen. No gallstones, gallbladder wall thickening, or biliary dilatation. Pancreas: Unremarkable. Spleen: Unremarkable. Adrenals/Urinary Tract: Adrenals, kidneys, and bladder are unremarkable. Stomach/Bowel: Stomach is within normal limits. Bowel is normal in caliber. Lymphatic: No enlarged nodes. Reproductive: Unremarkable. Other: No abdominal wall hernia. Musculoskeletal: No acute osseous abnormality. Review of the MIP images confirms the above findings. IMPRESSION: Type A thoracic aortic dissection extending to the mid arch. Ascending thoracic aorta measures up to 4.7 cm. Great vessel origins are spared. New cardiomegaly and moderate pericardial effusion. These results were called by telephone at the time of interpretation on 11/04/2021 at 12:54 pm to provider [facility] , who verbally acknowledged these results. Electronically Signed   By: Macy Mis M.D.   On: 11/04/2021 13:04    Procedures .Critical Care Performed by: Lucrezia Starch, MD Authorized by: Lucrezia Starch, MD   Critical care provider statement:    Critical care time (minutes):  38   Critical care was necessary to treat or prevent imminent or life-threatening deterioration of the following conditions:  Cardiac failure   Critical care was time spent personally by me on the following activities:  Development of treatment plan with patient or surrogate, discussions with consultants, evaluation of patient's response to treatment, examination of patient, ordering and review of laboratory studies, ordering and review of radiographic studies, ordering and performing treatments and interventions, pulse oximetry, re-evaluation of patient's condition and review of old charts   Care  discussed with: admitting provider     Medications Ordered in ED Medications  esmolol (BREVIBLOC) 2000 mg / 100 mL (20 mg/mL) infusion (200 mcg/kg/min  51.6 kg Intravenous Rate/Dose Change 11/04/21 1458)  docusate  sodium (COLACE) capsule 100 mg (has no administration in time range)  polyethylene glycol (MIRALAX / GLYCOLAX) packet 17 g (has no administration in time range)  fentaNYL (SUBLIMAZE) injection 25-50 mcg (has no administration in time range)  iohexol (OMNIPAQUE) 350 MG/ML injection 100 mL (100 mLs Intravenous Contrast Given 11/04/21 1236)    ED Course  I have reviewed the triage vital signs and the nursing notes.  Pertinent labs & imaging results that were available during my care of the patient were reviewed by me and considered in my medical decision making (see chart for details).    MDM Rules/Calculators/A&P                           70 year old lady presents to ER with concern for thoracic aortic dissection.  Currently patient is asymptomatic and quite well-appearing.  Obtain stat CT, consulted CT surgery.  CT chest confirmed type a thoracic aortic dissection extending to mid arch.  Her initial BP was around 660 systolic but subsequent blood pressures in 630Z systolic and heart rate in 80s.  Initiated esmolol.  CT surgery coordinator communicated on behalf of Dr. Cyndia Bent and stated that he wants patient admitted to medicine or cardiology, washout Eliquis, likely OR in 5 days.  Discussed case with Dr. Lamonte Sakai with the critical care service and they came to bedside and admitted patient.  After patient was admitted, I was notified by Dr. Cyndia Bent RN that they may have scheduling challenges for or availability and would potentially consider transfer to another facility.  At this time I believe the safest thing is for patient to be admitted to the ICU here at [facility] where we do have CT surgery available 24/7 and ICU available 24/7.  If patient needs to be transferred, will defer to  admitting ICU team and CT surgery team to coordinate this.  Final Clinical Impression(s) / ED Diagnoses Final diagnoses:  Acute thoracic aortic dissection  Pericardial effusion    Rx / DC Orders ED Discharge Orders     None        Lucrezia Starch, MD 11/04/21 1559

## 2021-11-04 NOTE — ED Notes (Signed)
Dr. Roslynn Amble to triage. CT notified of immediate need for CT without labwork prior.

## 2021-11-04 NOTE — ED Notes (Signed)
Attempted to call report

## 2021-11-04 NOTE — H&P (Signed)
NAME:  Kristine Davidson, MRN:  [id number], DOB:  [birth year], LOS: 0 ADMISSION DATE:  11/04/2021, CONSULTATION DATE:  11/04/2021 REFERRING MD:  Dr. Roslynn Amble, CHIEF COMPLAINT:  aortic dissection    History of Present Illness:   70 year old female with history as below presenting from cardiology office with abnormal echo.   Patient states that she had an episode of severe pain extending to throat to epigastric that lasted around 12 hours.  She took aleve with some improvement but did not seek evaluation till the next day.  She went to her PCP, per chart on 11/3, where she was found to be in atrial fibrillation.  She was was started on diltiazem and referred to the afib clinic where she was placed on Eliquis 5mg  BID on 11/7.  Her last dose was this morning.  She went for her echo today, where they saw evidence of an aortic dissection and moderate circumferential pericardial effusion and therefore sent to ER for further evaluation.  She also endorses some exertional shortness of breath which started prior to her chest discomfort.  She has smoked since she was 16, 3/4 pack per day at her heaviest, but currently taking few puffs a day since she was diagnosed with afib.    In ER, her initial BP was 125/76 and HR NSR 76.  She underwent CTA of chest and abdomen which revealed a type A thoracic aortic dissection extending to mid arch, measuring 4.7 cm, with moderate pericardial effusion and new cardiomegaly.  TCTS consulted in ER and she was started on esmolol.  Given recent Eliquis, PCCM asked to admit for initial medical management.   Pertinent  Medical History  Tobacco abuse, Ascending aortic aneurysm, hypothyroidism, aortic atherosclerosis, MVP, CAD, HLD, Afib on Eliquis, anxiety, arthritis, diverticular disease, osteoporosis, HSV2  Significant Hospital Events: Including procedures, antibiotic start and stop dates in addition to other pertinent events   Admitted with type A aortic dissection, medical  management for now on esmolol/ medical management.  TCTS consulted   11/15 CTA chest/abd >> Type A thoracic aortic dissection extending to the mid arch.  Ascending thoracic aorta measures up to 4.7 cm.  Great vessel origins are spared.  New cardiomegaly and moderate pericardial effusion.  11/15 TTE >> moderate circumferential pericardial effusion, IVC dilated suggesting RA pressure 15 mmHg, evidence of aortic dissection with dissection flap above the sinus of valsalva, then begin to extend to aortic arch, mild AR, LVEF 60-65%, no RWA, mild LVH, G2DD, normal RV, elevated RVSP 39.4  Interim History / Subjective:  Currently on esmolol at 200  Objective   Blood pressure 123/80, pulse 70, temperature 98.7 F (37.1 C), temperature source Oral, resp. rate 11, SpO2 96 %.       No intake or output data in the 24 hours ending 11/04/21 1452 There were no vitals filed for this visit.  Examination: General:  Thin older female sitting upright in ER stretcher in NAD HEENT: MM pink/moist Neuro: Aox 3, MAE CV: NSR in 60-70s, no murmur, intact distal pulses  PULM:  non labored, clear, slight rales in right base, no wheeze GI: soft, bs+, NT, purewick with good UOP thus far Extremities: warm- feet are slightly cool (sensation intact)/dry, no LE edema  Skin: no rashes  Labs noted:  INR 1.6, Na 134, sCr 0.56, albumin 3.1, Hgb 9.8 (last Hgb 13.1 on 12/07/2019- unable to see PCP labs)  Resolved Hospital Problem list    Assessment & Plan:   Type A aortic  dissection  Pericardial effusion - TCTS consulted by ER.  Medical management at this time. - Esmolol for SBP < 110, HR goal < 60, currently at 200 mcg/kg/min, if not within parameters can bolus x 1  - will likely need Eliquis reversal (last dose 11/15 AM), to discuss with TCTS - high risk for worsening pericardial effusion/ tamponade, monitor closely - insert Aline for close/ strict blood pressure management - Type and crossed in ER - NPO for  now - prn fentanyl for pain - closely monitor UOP/ strict I/Os. - trend renal indices / CBC closely  PAF on Eliquis Prior CHA2DS2-VASc score 3 - currently in NSR, monitor for now  HLD - hold home rosuvastatin  Hypothyroidism - resume home synthroid  Anxiety/ depression - holding wellbutrin and paxil for now  Tobacco abuse - has almost quit.   Best Practice (right click and "Reselect all SmartList Selections" daily)   Diet/type: NPO DVT prophylaxis: SCD GI prophylaxis: N/A Lines: N/A Foley:  N/A Code Status:  full code Last date of multidisciplinary goals of care discussion [11/15]- patient lives alone.  Her NOK is her brother coming into town today and her sister.  She has no children.   Labs   CBC: Recent Labs  Lab 11/04/21 1212  WBC 6.0  NEUTROABS 4.1  HGB 9.8*  HCT 30.6*  MCV 91.9  PLT 416    Basic Metabolic Panel: Recent Labs  Lab 11/04/21 1212  NA 134*  K 4.3  CL 98  CO2 26  GLUCOSE 98  BUN 16  CREATININE 0.56  CALCIUM 8.9   GFR: Estimated Creatinine Clearance: 51.8 mL/min (by C-G formula based on SCr of 0.56 mg/dL). Recent Labs  Lab 11/04/21 1212  WBC 6.0    Liver Function Tests: Recent Labs  Lab 11/04/21 1212  AST 19  ALT 17  ALKPHOS 84  BILITOT 0.5  PROT 6.8  ALBUMIN 3.1*   No results for input(s): LIPASE, AMYLASE in the last 168 hours. No results for input(s): AMMONIA in the last 168 hours.  ABG No results found for: PHART, PCO2ART, PO2ART, HCO3, TCO2, ACIDBASEDEF, O2SAT   Coagulation Profile: Recent Labs  Lab 11/04/21 1316  INR 1.6*    Cardiac Enzymes: No results for input(s): CKTOTAL, CKMB, CKMBINDEX, TROPONINI in the last 168 hours.  HbA1C: No results found for: HGBA1C  CBG: No results for input(s): GLUCAP in the last 168 hours.  Review of Systems:   Review of Systems  Constitutional:  Negative for chills and fever.  Respiratory:  Positive for shortness of breath. Negative for cough.   Cardiovascular:   Positive for chest pain. Negative for palpitations, orthopnea, claudication and leg swelling.  Gastrointestinal:  Positive for abdominal pain. Negative for nausea and vomiting.  Neurological:  Negative for tingling, sensory change and focal weakness.   Past Medical History:  She,  has a past medical history of Allergy, Anemia, Anxiety, Aortic atherosclerosis ([place]), Arthritis, B12 deficiency (03/31/2012), Depression, Diverticular disease, Emphysema of lung ([place]), HSV-2 (herpes simplex virus 2) infection, Hyperlipidemia, Hypertension, Mitral valve prolapse, Osteoporosis (01/14/2018), Substance abuse ([place]), Thoracic aortic ectasia ([place]), Thyroid disease, Tobacco dependence, and Vitamin D deficiency.   Surgical History:   Past Surgical History:  Procedure Laterality Date   COLONOSCOPY     D and C     TONSILLECTOMY     VARICOSE VEIN SURGERY  2000     Social History:   reports that she has been smoking cigarettes. She has a 21.50 pack-year smoking history.  She has never used smokeless tobacco. She reports that she does not drink alcohol and does not use drugs.   Family History:  Her family history includes Breast cancer (age of onset: 63) in her mother; Diabetes in her sister; Macular degeneration in her mother; Parkinson's disease in her father and sister; Stroke in her mother. There is no history of Colon polyps, Colon cancer, Esophageal cancer, Stomach cancer, or Rectal cancer.   Allergies Allergies  Allergen Reactions   Codeine Itching     Home Medications  Prior to Admission medications   Medication Sig Start Date End Date Taking? Authorizing Provider  acetaminophen (TYLENOL) 325 MG tablet Take 325-650 mg by mouth every 6 (six) hours as needed for fever or mild pain.   Yes [provider]  apixaban (ELIQUIS) 5 MG TABS tablet Take 1 tablet (5 mg total) by mouth 2 (two) times daily. 10/27/21  Yes Fenton, Clint R, PA  buPROPion (WELLBUTRIN XL) 150 MG 24 hr tablet Take 1 tablet  (150 mg total) by mouth daily. Patient taking differently: Take 150 mg by mouth in the morning. 12/07/19  Yes Wendie Agreste, MD  diltiazem (CARDIZEM CD) 120 MG 24 hr capsule Take 120 mg by mouth in the morning.   Yes [provider]  levothyroxine (SYNTHROID) 50 MCG tablet TAKE 1 TABLET (50 MCG TOTAL) BY MOUTH DAILY. Patient taking differently: Take 50 mcg by mouth at bedtime. 12/07/19  Yes Wendie Agreste, MD  MAGNESIUM PO Take 200 mg by mouth daily as needed (for constipation).   Yes [provider]  PARoxetine (PAXIL) 30 MG tablet Take 1 tablet (30 mg total) by mouth at bedtime. Patient taking differently: Take 30 mg by mouth in the morning. 12/07/19  Yes Wendie Agreste, MD  rosuvastatin (CRESTOR) 20 MG tablet Take 20 mg by mouth in the morning and at bedtime.   Yes [provider]  traZODone (DESYREL) 50 MG tablet Take 1-2 tablets (50-100 mg total) by mouth at bedtime. Patient taking differently: Take 50 mg by mouth at bedtime. 12/07/19  Yes Wendie Agreste, MD  valACYclovir (VALTREX) 1000 MG tablet Take 1 tablet (1,000 mg total) by mouth daily. Patient taking differently: Take 500 mg by mouth at bedtime. 12/07/19  Yes Wendie Agreste, MD     Critical care time: 66 mins     Kennieth Rad, ACNP  Pulmonary & Critical Care 11/04/2021, 4:23 PM  See Amion for pager If no response to pager, please call PCCM consult pager After 7:00 pm call Elink

## 2021-11-04 NOTE — Progress Notes (Incomplete)
{  Select Note:[id number]} 

## 2021-11-04 NOTE — Procedures (Signed)
Arterial Catheter Insertion Procedure Note  Kristine Davidson  [id number]  [birth year]  Date:11/04/21  Time:6:13 PM    Provider Performing: Esperanza Sheets T    Procedure: Insertion of Arterial Line [id number]) without US guidance  Indication(s) Blood pressure monitoring and/or need for frequent ABGs  Consent Risks of the procedure as well as the alternatives and risks of each were explained to the patient and/or caregiver.  Consent for the procedure was obtained and is signed in the bedside chart  Anesthesia None   Time Out Verified patient identification, verified procedure, site/side was marked, verified correct patient position, special equipment/implants available, medications/allergies/relevant history reviewed, required imaging and test results available.   Sterile Technique Maximal sterile technique including full sterile barrier drape, hand hygiene, sterile gown, sterile gloves, mask, hair covering, sterile ultrasound probe cover (if used).   Procedure Description Area of catheter insertion was cleaned with chlorhexidine and draped in sterile fashion. Without real-time ultrasound guidance an arterial catheter was placed into the right radial artery.  Appropriate arterial tracings confirmed on monitor.     Complications/Tolerance None; patient tolerated the procedure well.   EBL Minimal   Specimen(s) None

## 2021-11-04 NOTE — Consult Note (Signed)
[address]       [place] [address]             [phone]      Cardiothoracic Surgery Consultation  Reason for Consult: Acute type A aortic dissection Referring Physician: Dr. Baltazar Apo  Kristine Davidson is an 70 y.o. female.  HPI:   The patient is a 70 year old woman with a history of hyperlipidemia, hypertension, smoking and COPD, and ascending aortic aneurysm who reports having an episode of severe substernal chest pain 7 days ago that she initially though was indigestion after eating rice and beans for a couple days. The pain lasted about 12 hrs and resolved. She called her PCP and was seen and found to be in atrial fibrillation with no other abnormality found. She was started on Eliquis and scheduled for an echo which was done today. This suggested an ascending aortic dissection with a moderate pericardial effusion. There was a trileaflet aortic valve with mild AI. LVEF was normal with mild LVH and grade 2 diastolic dysfunction. She was asymptomatic. She was sent to the ER and had a CTA chest, abdomen and pelvis showing a type A dissection beginning around the STJ and extending into the distal arch with an ascending aorta measuring 4.7 cm. There was moderate pericardial effusion. She was admitted to the ICU on the CCM team for BP control. She has not had any further chest, back or abdominal pain since the initial event.   Past Medical History:  Diagnosis Date   Allergy    seasonal allergies   Anemia    hx of    Anxiety    on meds   Aortic atherosclerosis ([facility])    Arthritis    generalized   B12 deficiency 03/31/2012   Depression    on meds--hx of   Diverticular disease    Emphysema of lung ([place])    HSV-2 (herpes simplex virus 2) infection    Hyperlipidemia    diet controlled   Hypertension    Mitral valve prolapse    Osteoporosis 01/14/2018   DEXA scan at [facility] 01/14/18 T score -2.8 at Rt femur neck and L1-L4 AP spine   Substance abuse  ([place])    Thoracic aortic ectasia ([facility])    Thyroid disease    on meds   Tobacco dependence    Vitamin D deficiency     Past Surgical History:  Procedure Laterality Date   COLONOSCOPY     D and C     TONSILLECTOMY     VARICOSE VEIN SURGERY  2000    Family History  Problem Relation Age of Onset   Stroke Mother    Macular degeneration Mother    Breast cancer Mother 68   Parkinson's disease Father    Parkinson's disease Sister    Diabetes Sister    Colon polyps Neg Hx    Colon cancer Neg Hx    Esophageal cancer Neg Hx    Stomach cancer Neg Hx    Rectal cancer Neg Hx     Social History:  reports that she has been smoking cigarettes. She has a 21.50 pack-year smoking history. She has never used smokeless tobacco. She reports that she does not drink alcohol and does not use drugs.  Allergies:  Allergies  Allergen Reactions   Codeine Itching    Medications: I have reviewed the patient's current medications. Prior to Admission:  Medications Prior to Admission  Medication Sig  Dispense Refill Last Dose   acetaminophen (TYLENOL) 325 MG tablet Take 325-650 mg by mouth every 6 (six) hours as needed for fever or mild pain.   Past Week   apixaban (ELIQUIS) 5 MG TABS tablet Take 1 tablet (5 mg total) by mouth 2 (two) times daily. 60 tablet 3 11/04/2021 at 0800   buPROPion (WELLBUTRIN XL) 150 MG 24 hr tablet Take 1 tablet (150 mg total) by mouth daily. (Patient taking differently: Take 150 mg by mouth in the morning.) 90 tablet 3 11/04/2021 at am   diltiazem (CARDIZEM CD) 120 MG 24 hr capsule Take 120 mg by mouth in the morning.   11/04/2021 at am   levothyroxine (SYNTHROID) 50 MCG tablet TAKE 1 TABLET (50 MCG TOTAL) BY MOUTH DAILY. (Patient taking differently: Take 50 mcg by mouth at bedtime.) 90 tablet 3 11/03/2021 at pm   MAGNESIUM PO Take 200 mg by mouth daily as needed (for constipation).   unk   PARoxetine (PAXIL) 30 MG tablet Take 1 tablet (30 mg total) by mouth at bedtime.  (Patient taking differently: Take 30 mg by mouth in the morning.) 90 tablet 3 11/04/2021 at am   rosuvastatin (CRESTOR) 20 MG tablet Take 20 mg by mouth in the morning and at bedtime.   11/04/2021 at am   traZODone (DESYREL) 50 MG tablet Take 1-2 tablets (50-100 mg total) by mouth at bedtime. (Patient taking differently: Take 50 mg by mouth at bedtime.) 180 tablet 3 11/03/2021 at pm   valACYclovir (VALTREX) 1000 MG tablet Take 1 tablet (1,000 mg total) by mouth daily. (Patient taking differently: Take 500 mg by mouth at bedtime.) 90 tablet 3 11/03/2021 at pm   Scheduled: Continuous:  esmolol 300 mcg/kg/min (11/04/21 1939)   OAC:ZYSAYTKZ sodium, fentaNYL (SUBLIMAZE) injection, polyethylene glycol Anti-infectives (From admission, onward)    None       Results for orders placed or performed during the hospital encounter of 11/04/21 (from the past 48 hour(s))  Type and screen [place]     Status: None   Collection Time: 11/04/21 12:12 PM  Result Value Ref Range   ABO/RH(D) O NEG    Antibody Screen NEG    Sample Expiration      11/07/2021,2359 Performed at [facility], [place] [address], [place],  [address]   CBC with Differential     Status: Abnormal   Collection Time: 11/04/21 12:12 PM  Result Value Ref Range   WBC 6.0 4.0 - 10.5 K/uL   RBC 3.33 (L) 3.87 - 5.11 MIL/uL   Hemoglobin 9.8 (L) 12.0 - 15.0 g/dL   HCT 30.6 (L) 36.0 - 46.0 %   MCV 91.9 80.0 - 100.0 fL   MCH 29.4 26.0 - 34.0 pg   MCHC 32.0 30.0 - 36.0 g/dL   RDW 13.7 11.5 - 15.5 %   Platelets 302 150 - 400 K/uL   nRBC 0.0 0.0 - 0.2 %   Neutrophils Relative % 67 %   Neutro Abs 4.1 1.7 - 7.7 K/uL   Lymphocytes Relative 21 %   Lymphs Abs 1.3 0.7 - 4.0 K/uL   Monocytes Relative 9 %   Monocytes Absolute 0.6 0.1 - 1.0 K/uL   Eosinophils Relative 1 %   Eosinophils Absolute 0.0 0.0 - 0.5 K/uL   Basophils Relative 1 %   Basophils Absolute 0.0 0.0 - 0.1 K/uL   Immature Granulocytes 1 %    Abs Immature Granulocytes 0.04 0.00 - 0.07 K/uL  Comment: Performed at [facility], [place] [address], [place], [place] [address]  Troponin I (High Sensitivity)     Status: None   Collection Time: 11/04/21 12:12 PM  Result Value Ref Range   Troponin I (High Sensitivity) 4 <18 ng/L    Comment: (NOTE) Elevated high sensitivity troponin I (hsTnI) values and significant  changes across serial measurements may suggest ACS but many other  chronic and acute conditions are known to elevate hsTnI results.  Refer to the "Links" section for chest pain algorithms and additional  guidance. Performed at [facility], [place] [address] [place], [place] [address]   Comprehensive metabolic panel     Status: Abnormal   Collection Time: 11/04/21 12:12 PM  Result Value Ref Range   Sodium 134 (L) 135 - 145 mmol/L   Potassium 4.3 3.5 - 5.1 mmol/L   Chloride 98 98 - 111 mmol/L   CO2 26 22 - 32 mmol/L   Glucose, Bld 98 70 - 99 mg/dL    Comment: Glucose reference range applies only to samples taken after fasting for at least 8 hours.   BUN 16 8 - 23 mg/dL   Creatinine, Ser 0.56 0.44 - 1.00 mg/dL   Calcium 8.9 8.9 - 10.3 mg/dL   Total Protein 6.8 6.5 - 8.1 g/dL   Albumin 3.1 (L) 3.5 - 5.0 g/dL   AST 19 15 - 41 U/L   ALT 17 0 - 44 U/L   Alkaline Phosphatase 84 38 - 126 U/L   Total Bilirubin 0.5 0.3 - 1.2 mg/dL   GFR, Estimated >60 >60 mL/min    Comment: (NOTE) Calculated using the CKD-EPI Creatinine Equation (2021)    Anion gap 10 5 - 15    Comment: Performed at [facility] [address] [place],  [address]  Resp Panel by RT-PCR (Flu A&B, Covid) Nasopharyngeal Swab     Status: None   Collection Time: 11/04/21 12:17 PM   Specimen: Nasopharyngeal Swab; Nasopharyngeal(NP) swabs in vial transport medium  Result Value Ref Range   SARS Coronavirus 2 by RT PCR NEGATIVE NEGATIVE    Comment: (NOTE) SARS-CoV-2 target nucleic acids are NOT DETECTED.  The SARS-CoV-2 RNA is  generally detectable in upper respiratory specimens during the acute phase of infection. The lowest concentration of SARS-CoV-2 viral copies this assay can detect is 138 copies/mL. A negative result does not preclude SARS-Cov-2 infection and should not be used as the sole basis for treatment or other patient management decisions. A negative result may occur with  improper specimen collection/handling, submission of specimen other than nasopharyngeal swab, presence of viral mutation(s) within the areas targeted by this assay, and inadequate number of viral copies(<138 copies/mL). A negative result must be combined with clinical observations, patient history, and epidemiological information. The expected result is Negative.  Fact Sheet for Patients:  EntrepreneurPulse.com.au  Fact Sheet for Healthcare Providers:  IncredibleEmployment.be  This test is no t yet approved or cleared by the [place] FDA and  has been authorized for detection and/or diagnosis of SARS-CoV-2 by FDA under an Emergency Use Authorization (EUA). This EUA will remain  in effect (meaning this test can be used) for the duration of the COVID-19 declaration under Section 564(b)(1) of the Act, 21 U.S.C.section 360bbb-3(b)(1), unless the authorization is terminated  or revoked sooner.       Influenza A by PCR NEGATIVE NEGATIVE   Influenza B by PCR NEGATIVE NEGATIVE    Comment: (NOTE) The Xpert  Xpress SARS-CoV-2/FLU/RSV plus assay is intended as an aid in the diagnosis of influenza from Nasopharyngeal swab specimens and should not be used as a sole basis for treatment. Nasal washings and aspirates are unacceptable for Xpert Xpress SARS-CoV-2/FLU/RSV testing.  Fact Sheet for Patients: EntrepreneurPulse.com.au  Fact Sheet for Healthcare Providers: IncredibleEmployment.be  This test is not yet approved or cleared by the [place] FDA  and has been authorized for detection and/or diagnosis of SARS-CoV-2 by FDA under an Emergency Use Authorization (EUA). This EUA will remain in effect (meaning this test can be used) for the duration of the COVID-19 declaration under Section 564(b)(1) of the Act, 21 U.S.C. section 360bbb-3(b)(1), unless the authorization is terminated or revoked.  Performed at [facility], [place] [address], [place], [place] [address]   ABO/Rh     Status: None   Collection Time: 11/04/21 12:47 PM  Result Value Ref Range   ABO/RH(D)      O NEG Performed at [facility] [address] [place], [place] [address]   Protime-INR     Status: Abnormal   Collection Time: 11/04/21  1:16 PM  Result Value Ref Range   Prothrombin Time 19.1 (H) 11.4 - 15.2 seconds   INR 1.6 (H) 0.8 - 1.2    Comment: (NOTE) INR goal varies based on device and disease states. Performed at [facility], [place] [address] [place], [place] [address]   Troponin I (High Sensitivity)     Status: None   Collection Time: 11/04/21  2:09 PM  Result Value Ref Range   Troponin I (High Sensitivity) 5 <18 ng/L    Comment: (NOTE) Elevated high sensitivity troponin I (hsTnI) values and significant  changes across serial measurements may suggest ACS but many other  chronic and acute conditions are known to elevate hsTnI results.  Refer to the "Links" section for chest pain algorithms and additional  guidance. Performed at [facility], [place] [address], [place], [place] [address]     ECHOCARDIOGRAM COMPLETE  Result Date: 11/04/2021    ECHOCARDIOGRAM REPORT   Patient Name:   RAENETTE SAKATA Date of Exam: 11/04/2021 Medical Rec #:  [id number]        Height:       65.0 in Accession #:    [id number]       Weight:       113.8 lb Date of Birth:  [birth year]         BSA:          1.557 m Patient Age:    67 years         BP:           139/77 mmHg Patient Gender: F                HR:           74 bpm. Exam Location:  Outpatient  Procedure: 2D Echo STAT ECHO Indications:    atrial fibrillation  History:        Patient has no prior history of Echocardiogram examinations.                 Suspected aortic dissection.  Sonographer:    Johny Chess RDCS Referring Phys: [id number] [place]  1. The mitral valve is normal in structure. No evidence of mitral valve regurgitation. No evidence of mitral stenosis.  2. Moderate pericardial effusion. The pericardial effusion is circumferential.  3. The inferior vena  cava is dilated in size with <50% respiratory variability, suggesting right atrial pressure of 15 mmHg.  4. There is evidence of an aortic dissection with a dissection flap above the S inus of Valsalva. This begins to extendin to the aortic arch.  5. Left ventricular ejection fraction, by estimation, is 60 to 65%. The left ventricle has normal function. The left ventricle has no regional wall motion abnormalities. There is mild concentric left ventricular hypertrophy. Left ventricular diastolic parameters are consistent with Grade II diastolic dysfunction (pseudonormalization).  6. Right ventricular systolic function is normal. The right ventricular size is normal. There is mildly elevated pulmonary artery systolic pressure. The estimated right ventricular systolic pressure is 30.0 mmHg.  7. The aortic valve is tricuspid. Aortic valve regurgitation is mild. No aortic stenosis is present. Comparison(s): No prior Echocardiogram. Conclusion(s)/Recommendation(s): Emergent finding: Patient sent to ED for stat imaging. Discussed with Patient, CT surgery, ED receiving team. FINDINGS  Left Ventricle: Left ventricular ejection fraction, by estimation, is 60 to 65%. The left ventricle has normal function. The left ventricle has no regional wall motion abnormalities. The left ventricular internal cavity size was normal in size. There is  mild concentric left ventricular hypertrophy. Left ventricular diastolic parameters are consistent  with Grade II diastolic dysfunction (pseudonormalization). Right Ventricle: The right ventricular size is normal. No increase in right ventricular wall thickness. Right ventricular systolic function is normal. There is mildly elevated pulmonary artery systolic pressure. The tricuspid regurgitant velocity is 2.47  m/s, and with an assumed right atrial pressure of 15 mmHg, the estimated right ventricular systolic pressure is 76.2 mmHg. Left Atrium: Left atrial size was normal in size. Right Atrium: Right atrial size was normal in size. Pericardium: A moderately sized pericardial effusion is present. The pericardial effusion is circumferential. Mitral Valve: The mitral valve is normal in structure. No evidence of mitral valve regurgitation. No evidence of mitral valve stenosis. Tricuspid Valve: The tricuspid valve is normal in structure. Tricuspid valve regurgitation is mild. Aortic Valve: The aortic valve is tricuspid. Aortic valve regurgitation is mild. No aortic stenosis is present. Pulmonic Valve: The pulmonic valve was grossly normal. Pulmonic valve regurgitation is trivial. No evidence of pulmonic stenosis. Aorta: There is evidence of an aortic dissection with a dissection flap above the S inus of Valsalva. This begins to extendin to the aortic arch. The aortic root is normal in size and structure. Venous: The inferior vena cava is dilated in size with less than 50% respiratory variability, suggesting right atrial pressure of 15 mmHg. IAS/Shunts: The atrial septum is grossly normal.  LEFT VENTRICLE PLAX 2D LVIDd:         3.90 cm   Diastology LVIDs:         2.60 cm   LV e' medial:    4.35 cm/s LV PW:         1.10 cm   LV E/e' medial:  20.7 LV IVS:        1.00 cm   LV e' lateral:   9.36 cm/s LVOT diam:     1.80 cm   LV E/e' lateral: 9.6 LV SV:         47 LV SV Index:   30 LVOT Area:     2.54 cm  RIGHT VENTRICLE         IVC TAPSE (M-mode): 1.9 cm  IVC diam: 3.20 cm LEFT ATRIUM             Index  RIGHT ATRIUM            Index LA diam:        3.10 cm 1.99 cm/m   RA Area:     16.80 cm LA Vol (A2C):   68.5 ml 44.01 ml/m  RA Volume:   46.30 ml  29.74 ml/m LA Vol (A4C):   47.3 ml 30.39 ml/m LA Biplane Vol: 58.4 ml 37.52 ml/m  AORTIC VALVE LVOT Vmax:   100.00 cm/s LVOT Vmean:  66.400 cm/s LVOT VTI:    0.186 m  AORTA Ao Root diam: 3.50 cm MITRAL VALVE               TRICUSPID VALVE MV Area (PHT): 4.15 cm    TR Peak grad:   24.4 mmHg MV Decel Time: 183 msec    TR Vmax:        247.00 cm/s MV E velocity: 90.00 cm/s MV A velocity: 85.70 cm/s  SHUNTS MV E/A ratio:  1.05        Systemic VTI:  0.19 m                            Systemic Diam: 1.80 cm Rudean Haskell MD Electronically signed by Rudean Haskell MD Signature Date/Time: 11/04/2021/12:19:24 PM    Final    CT Angio Chest/Abd/Pel for Dissection W and/or Wo Contrast  Result Date: 11/04/2021 CLINICAL DATA:  Abdominal pain, aortic dissection suspected EXAM: CT ANGIOGRAPHY CHEST, ABDOMEN AND PELVIS TECHNIQUE: Non-contrast CT of the chest was initially obtained. Multidetector CT imaging through the chest, abdomen and pelvis was performed using the standard protocol during bolus administration of intravenous contrast. Multiplanar reconstructed images and MIPs were obtained and reviewed to evaluate the vascular anatomy. CONTRAST:  152mL OMNIPAQUE IOHEXOL 350 MG/ML SOLN COMPARISON:  None. FINDINGS: CTA CHEST FINDINGS Cardiovascular: There is a new dissection of the thoracic aorta extending from the ascending aorta to the mid arch. No extension into the descending portion. Great vessel origins are spared. Ascending aorta measures up to 4.7 cm. Cardiac size is increased. There is a new moderate pericardial effusion. No evidence of central pulmonary embolism. Mediastinum/Nodes: No enlarged nodes. Thyroid and esophagus are unremarkable. Lungs/Pleura: No consolidation or mass. Minor subsegmental atelectasis/scarring. Musculoskeletal: No acute osseous abnormality. Review  of the MIP images confirms the above findings. CTA ABDOMEN AND PELVIS FINDINGS VASCULAR Aorta: Normal in caliber.  No evidence of dissection.  Mixed plaque. Celiac: Patent without origin stenosis. SMA: Patent without origin stenosis. Renals: Patent without origin stenosis. IMA: Patent without origin stenosis. Inflow: Patent and normal in caliber.  Calcified plaque. Veins: Not well evaluated. Review of the MIP images confirms the above findings. NON-VASCULAR Hepatobiliary: No focal liver abnormality is seen. No gallstones, gallbladder wall thickening, or biliary dilatation. Pancreas: Unremarkable. Spleen: Unremarkable. Adrenals/Urinary Tract: Adrenals, kidneys, and bladder are unremarkable. Stomach/Bowel: Stomach is within normal limits. Bowel is normal in caliber. Lymphatic: No enlarged nodes. Reproductive: Unremarkable. Other: No abdominal wall hernia. Musculoskeletal: No acute osseous abnormality. Review of the MIP images confirms the above findings. IMPRESSION: Type A thoracic aortic dissection extending to the mid arch. Ascending thoracic aorta measures up to 4.7 cm. Great vessel origins are spared. New cardiomegaly and moderate pericardial effusion. These results were called by telephone at the time of interpretation on 11/04/2021 at 12:54 pm to provider [facility] , who verbally acknowledged these results. Electronically Signed   By: Macy Mis M.D.   On: 11/04/2021 13:04  Review of Systems  Constitutional:  Positive for fatigue.       For months  HENT: Negative.    Eyes: Negative.   Respiratory:  Negative for shortness of breath.   Cardiovascular:  Negative for palpitations and leg swelling.       Has had chest tightness before.  Gastrointestinal: Negative.   Endocrine: Negative.   Genitourinary: Negative.   Musculoskeletal: Negative.   Skin: Negative.   Allergic/Immunologic: Negative.   Neurological:  Negative for dizziness, syncope, light-headedness, numbness and headaches.   Hematological: Negative.   Psychiatric/Behavioral: Negative.    Blood pressure 112/77, pulse 62, temperature 98.2 F (36.8 C), temperature source Oral, resp. rate 16, SpO2 95 %. Physical Exam Constitutional:      Appearance: Normal appearance. She is normal weight.  HENT:     Head: Normocephalic and atraumatic.     Mouth/Throat:     Mouth: Mucous membranes are moist.     Pharynx: Oropharynx is clear.  Eyes:     Extraocular Movements: Extraocular movements intact.     Conjunctiva/sclera: Conjunctivae normal.     Pupils: Pupils are equal, round, and reactive to light.  Cardiovascular:     Rate and Rhythm: Normal rate and regular rhythm.     Pulses: Normal pulses.     Heart sounds: Normal heart sounds. No murmur heard. Pulmonary:     Effort: Pulmonary effort is normal.     Breath sounds: Normal breath sounds.  Abdominal:     General: Abdomen is flat. Bowel sounds are normal.     Palpations: Abdomen is soft.  Musculoskeletal:        General: No swelling.  Skin:    General: Skin is warm and dry.  Neurological:     General: No focal deficit present.     Mental Status: She is alert and oriented to person, place, and time.  Psychiatric:        Mood and Affect: Mood normal.        Behavior: Behavior normal.       ECHOCARDIOGRAM REPORT         Patient Name:   NDIDI NESBY Date of Exam: 11/04/2021  Medical Rec #:  [id number]        Height:       65.0 in  Accession #:    [id number]       Weight:       113.8 lb  Date of Birth:  1951/08/31         BSA:          1.557 m  Patient Age:    21 years         BP:           139/77 mmHg  Patient Gender: F                HR:           74 bpm.  Exam Location:  Outpatient   Procedure: 2D Echo   STAT ECHO   Indications:    atrial fibrillation     History:        Patient has no prior history of Echocardiogram  examinations.                  Suspected aortic dissection.     Sonographer:    Johny Chess RDCS  Referring Phys:  [id number] [place]     1. The mitral valve is normal in  structure. No evidence of mitral valve  regurgitation. No evidence of mitral stenosis.   2. Moderate pericardial effusion. The pericardial effusion is  circumferential.   3. The inferior vena cava is dilated in size with <50% respiratory  variability, suggesting right atrial pressure of 15 mmHg.   4. There is evidence of an aortic dissection with a dissection flap above  the S inus of Valsalva. This begins to extendin to the aortic arch.   5. Left ventricular ejection fraction, by estimation, is 60 to 65%. The  left ventricle has normal function. The left ventricle has no regional  wall motion abnormalities. There is mild concentric left ventricular  hypertrophy. Left ventricular diastolic  parameters are consistent with Grade II diastolic dysfunction  (pseudonormalization).   6. Right ventricular systolic function is normal. The right ventricular  size is normal. There is mildly elevated pulmonary artery systolic  pressure. The estimated right ventricular systolic pressure is 85.4 mmHg.   7. The aortic valve is tricuspid. Aortic valve regurgitation is mild. No  aortic stenosis is present.   Comparison(s): No prior Echocardiogram.   Conclusion(s)/Recommendation(s): Emergent finding: Patient sent to ED for  stat imaging. Discussed with Patient, CT surgery, ED receiving team.   FINDINGS   Left Ventricle: Left ventricular ejection fraction, by estimation, is 60  to 65%. The left ventricle has normal function. The left ventricle has no  regional wall motion abnormalities. The left ventricular internal cavity  size was normal in size. There is   mild concentric left ventricular hypertrophy. Left ventricular diastolic  parameters are consistent with Grade II diastolic dysfunction  (pseudonormalization).   Right Ventricle: The right ventricular size is normal. No increase in  right ventricular wall thickness.  Right ventricular systolic function is  normal. There is mildly elevated pulmonary artery systolic pressure. The  tricuspid regurgitant velocity is 2.47   m/s, and with an assumed right atrial pressure of 15 mmHg, the estimated  right ventricular systolic pressure is 62.7 mmHg.   Left Atrium: Left atrial size was normal in size.   Right Atrium: Right atrial size was normal in size.   Pericardium: A moderately sized pericardial effusion is present. The  pericardial effusion is circumferential.   Mitral Valve: The mitral valve is normal in structure. No evidence of  mitral valve regurgitation. No evidence of mitral valve stenosis.   Tricuspid Valve: The tricuspid valve is normal in structure. Tricuspid  valve regurgitation is mild.   Aortic Valve: The aortic valve is tricuspid. Aortic valve regurgitation is  mild. No aortic stenosis is present.   Pulmonic Valve: The pulmonic valve was grossly normal. Pulmonic valve  regurgitation is trivial. No evidence of pulmonic stenosis.   Aorta: There is evidence of an aortic dissection with a dissection flap  above the S inus of Valsalva. This begins to extendin to the aortic arch.  The aortic root is normal in size and structure.   Venous: The inferior vena cava is dilated in size with less than 50%  respiratory variability, suggesting right atrial pressure of 15 mmHg.   IAS/Shunts: The atrial septum is grossly normal.      LEFT VENTRICLE  PLAX 2D  LVIDd:         3.90 cm   Diastology  LVIDs:         2.60 cm   LV e' medial:    4.35 cm/s  LV PW:         1.10 cm   LV E/e' medial:  20.7  LV IVS:        1.00 cm   LV e' lateral:   9.36 cm/s  LVOT diam:     1.80 cm   LV E/e' lateral: 9.6  LV SV:         47  LV SV Index:   30  LVOT Area:     2.54 cm      RIGHT VENTRICLE         IVC  TAPSE (M-mode): 1.9 cm  IVC diam: 3.20 cm   LEFT ATRIUM             Index        RIGHT ATRIUM           Index  LA diam:        3.10 cm 1.99 cm/m   RA  Area:     16.80 cm  LA Vol (A2C):   68.5 ml 44.01 ml/m  RA Volume:   46.30 ml  29.74 ml/m  LA Vol (A4C):   47.3 ml 30.39 ml/m  LA Biplane Vol: 58.4 ml 37.52 ml/m   AORTIC VALVE  LVOT Vmax:   100.00 cm/s  LVOT Vmean:  66.400 cm/s  LVOT VTI:    0.186 m     AORTA  Ao Root diam: 3.50 cm   MITRAL VALVE               TRICUSPID VALVE  MV Area (PHT): 4.15 cm    TR Peak grad:   24.4 mmHg  MV Decel Time: 183 msec    TR Vmax:        247.00 cm/s  MV E velocity: 90.00 cm/s  MV A velocity: 85.70 cm/s  SHUNTS  MV E/A ratio:  1.05        Systemic VTI:  0.19 m                             Systemic Diam: 1.80 cm   Rudean Haskell MD  Electronically signed by Rudean Haskell MD  Signature Date/Time: 11/04/2021/12:19:24 PM         Final    Narrative & Impression  CLINICAL DATA:  Abdominal pain, aortic dissection suspected   EXAM: CT ANGIOGRAPHY CHEST, ABDOMEN AND PELVIS   TECHNIQUE: Non-contrast CT of the chest was initially obtained.   Multidetector CT imaging through the chest, abdomen and pelvis was performed using the standard protocol during bolus administration of intravenous contrast. Multiplanar reconstructed images and MIPs were obtained and reviewed to evaluate the vascular anatomy.   CONTRAST:  181mL OMNIPAQUE IOHEXOL 350 MG/ML SOLN   COMPARISON:  None.   FINDINGS: CTA CHEST FINDINGS   Cardiovascular: There is a new dissection of the thoracic aorta extending from the ascending aorta to the mid arch. No extension into the descending portion. Great vessel origins are spared. Ascending aorta measures up to 4.7 cm. Cardiac size is increased. There is a new moderate pericardial effusion. No evidence of central pulmonary embolism.   Mediastinum/Nodes: No enlarged nodes. Thyroid and esophagus are unremarkable.   Lungs/Pleura: No consolidation or mass. Minor subsegmental atelectasis/scarring.   Musculoskeletal: No acute osseous abnormality.   Review of  the MIP images confirms the above findings.   CTA ABDOMEN AND PELVIS FINDINGS   VASCULAR   Aorta: Normal in caliber.  No evidence of dissection.  Mixed plaque.   Celiac: Patent without origin stenosis.   SMA: Patent without origin stenosis.  Renals: Patent without origin stenosis.   IMA: Patent without origin stenosis.   Inflow: Patent and normal in caliber.  Calcified plaque.   Veins: Not well evaluated.   Review of the MIP images confirms the above findings.   NON-VASCULAR   Hepatobiliary: No focal liver abnormality is seen. No gallstones, gallbladder wall thickening, or biliary dilatation.   Pancreas: Unremarkable.   Spleen: Unremarkable.   Adrenals/Urinary Tract: Adrenals, kidneys, and bladder are unremarkable.   Stomach/Bowel: Stomach is within normal limits. Bowel is normal in caliber.   Lymphatic: No enlarged nodes.   Reproductive: Unremarkable.   Other: No abdominal wall hernia.   Musculoskeletal: No acute osseous abnormality.   Review of the MIP images confirms the above findings.   IMPRESSION: Type A thoracic aortic dissection extending to the mid arch. Ascending thoracic aorta measures up to 4.7 cm. Great vessel origins are spared.   New cardiomegaly and moderate pericardial effusion.   These results were called by telephone at the time of interpretation on 11/04/2021 at 12:54 pm to provider [facility] , who verbally acknowledged these results.     Electronically Signed   By: Macy Mis M.D.   On: 11/04/2021 13:04    Assessment/Plan:  She has a 7 day old type A aortic dissection involving the ascending aorta and arch with aneurysmal disease involving both with a maximum diameter of 4.7 cm. There is a moderate pericardial effusion. It appears to begin just beyond the STJ. The aortic valve is functioning normally with mild central AI and an aortic root diameter of 4 cm with a well defined STJ measuring 3 cm. I think she will require  replacement of the ascending aorta and arch from the STJ to the left subclavian region. This will require axillary artery cannulation and circulatory arrest with bypass to the innominate, left carotid and probably left subclavian arteries. She has been on Eliquis until this am and ideally should be off of it for at least 5 days before major surgery. She has been asymptomatic since her initial event 7 days ago and I think the goal at this point is tight BP control with beta blocker and delayed surgery after the Eliquis has worn off. She is receiving ANDEXXA which may help but is not completely reliable in resolving the coagulopathy from Eliquis especially in a surgery associated with exacerbating coagulopathy. She also has some coronary calcification and is an ongoing heavy smoker so I think she needs coronary assessment with a gated cardiac CTA which has been scheduled by cardiology for tomorrow. Our operative schedule through the Thanksgiving holiday is currently full with only 3 surgeons on staff and it may be best to consider transfer to [facility] for surgery if they can do it sooner. I discussed all of this with her.   Gaye Pollack 11/04/2021, 9:12 PM

## 2021-11-04 NOTE — Progress Notes (Signed)
eLink Physician-Brief Progress Note Patient Name: Kristine Davidson DOB: 03/01/1951 MRN: [id number]   Date of Service  11/04/2021  HPI/Events of Note  Surgeon cleared patient for oral intake tonight per bedside RN. Patient asking that her home Valtrex and Trazodone be ordered.  eICU Interventions  Orders entered for above home medications.        Kerry Kass Nyazia Canevari 11/04/2021, 10:25 PM

## 2021-11-04 NOTE — Consult Note (Addendum)
Consult note:  This a 70 yo F with a history of Ascending Aortic Aneurysm 42 mm.  She has acute onset abdominal pain one week ago.  In eval at PCP last Wednesday found to be in AF.  Saw AF clinic and on new Eliquis (last does this AM).  Went for echo in [place] to have an ascending aortic dissection flap.  Mild central AI and small effusion.  Patient feels fine.  HF 70s Radial pulse +2 and diastolic murmur.  - Called CT Surgery, discussed with RN nurse navigator - needs CT Aorta protocol STAT - needs ED Eval.  Working to get her to the ED.  HD stable as of 11:40 AM.  Full note will eventually follow.  Rudean Haskell, MD [place], [address] [place], [place] [address] [phone]  11:42 AM  Addendum: Discussed with ED MD; pending CT Aorta. Seen in 2H18- seen ipatient noted.

## 2021-11-04 NOTE — ED Triage Notes (Signed)
Pt presents after getting an outpatient Echo today which reportedly showed aortic dissection. Pt reports feeling upper abdominal indigestion x1 week. Pt endorses feeling of fullness in her chest. Denies SOB.

## 2021-11-05 ENCOUNTER — Inpatient Hospital Stay (HOSPITAL_COMMUNITY): Payer: Medicare Other

## 2021-11-05 ENCOUNTER — Encounter (HOSPITAL_COMMUNITY): Payer: Self-pay | Admitting: Emergency Medicine

## 2021-11-05 DIAGNOSIS — I951 Orthostatic hypotension: Secondary | ICD-10-CM | POA: Diagnosis not present

## 2021-11-05 DIAGNOSIS — I480 Paroxysmal atrial fibrillation: Secondary | ICD-10-CM | POA: Diagnosis not present

## 2021-11-05 DIAGNOSIS — E039 Hypothyroidism, unspecified: Secondary | ICD-10-CM | POA: Diagnosis not present

## 2021-11-05 DIAGNOSIS — Z4682 Encounter for fitting and adjustment of non-vascular catheter: Secondary | ICD-10-CM | POA: Diagnosis not present

## 2021-11-05 DIAGNOSIS — D62 Acute posthemorrhagic anemia: Secondary | ICD-10-CM

## 2021-11-05 DIAGNOSIS — J951 Acute pulmonary insufficiency following thoracic surgery: Secondary | ICD-10-CM | POA: Diagnosis not present

## 2021-11-05 DIAGNOSIS — I71019 Dissection of thoracic aorta, unspecified: Secondary | ICD-10-CM

## 2021-11-05 DIAGNOSIS — F419 Anxiety disorder, unspecified: Secondary | ICD-10-CM | POA: Diagnosis not present

## 2021-11-05 DIAGNOSIS — I5032 Chronic diastolic (congestive) heart failure: Secondary | ICD-10-CM | POA: Diagnosis not present

## 2021-11-05 DIAGNOSIS — I9788 Other intraoperative complications of the circulatory system, not elsewhere classified: Secondary | ICD-10-CM | POA: Diagnosis not present

## 2021-11-05 DIAGNOSIS — I10 Essential (primary) hypertension: Secondary | ICD-10-CM | POA: Insufficient documentation

## 2021-11-05 DIAGNOSIS — Z79899 Other long term (current) drug therapy: Secondary | ICD-10-CM | POA: Diagnosis not present

## 2021-11-05 DIAGNOSIS — I71011 Dissection of aortic arch: Secondary | ICD-10-CM | POA: Diagnosis not present

## 2021-11-05 DIAGNOSIS — I7100 Dissection of unspecified site of aorta: Secondary | ICD-10-CM | POA: Diagnosis not present

## 2021-11-05 DIAGNOSIS — J90 Pleural effusion, not elsewhere classified: Secondary | ICD-10-CM | POA: Diagnosis not present

## 2021-11-05 DIAGNOSIS — I739 Peripheral vascular disease, unspecified: Secondary | ICD-10-CM | POA: Diagnosis not present

## 2021-11-05 DIAGNOSIS — I97190 Other postprocedural cardiac functional disturbances following cardiac surgery: Secondary | ICD-10-CM | POA: Diagnosis not present

## 2021-11-05 DIAGNOSIS — J9811 Atelectasis: Secondary | ICD-10-CM | POA: Diagnosis not present

## 2021-11-05 DIAGNOSIS — Z95828 Presence of other vascular implants and grafts: Secondary | ICD-10-CM | POA: Diagnosis not present

## 2021-11-05 DIAGNOSIS — I2510 Atherosclerotic heart disease of native coronary artery without angina pectoris: Secondary | ICD-10-CM | POA: Diagnosis not present

## 2021-11-05 DIAGNOSIS — I3139 Other pericardial effusion (noninflammatory): Secondary | ICD-10-CM | POA: Diagnosis not present

## 2021-11-05 DIAGNOSIS — I7776 Dissection of artery of upper extremity: Secondary | ICD-10-CM | POA: Diagnosis not present

## 2021-11-05 DIAGNOSIS — Z20822 Contact with and (suspected) exposure to covid-19: Secondary | ICD-10-CM | POA: Diagnosis not present

## 2021-11-05 DIAGNOSIS — F32A Depression, unspecified: Secondary | ICD-10-CM | POA: Diagnosis not present

## 2021-11-05 DIAGNOSIS — R5381 Other malaise: Secondary | ICD-10-CM | POA: Diagnosis not present

## 2021-11-05 DIAGNOSIS — G479 Sleep disorder, unspecified: Secondary | ICD-10-CM | POA: Diagnosis not present

## 2021-11-05 DIAGNOSIS — I110 Hypertensive heart disease with heart failure: Secondary | ICD-10-CM | POA: Diagnosis not present

## 2021-11-05 DIAGNOSIS — I341 Nonrheumatic mitral (valve) prolapse: Secondary | ICD-10-CM | POA: Diagnosis not present

## 2021-11-05 DIAGNOSIS — F17210 Nicotine dependence, cigarettes, uncomplicated: Secondary | ICD-10-CM | POA: Diagnosis not present

## 2021-11-05 DIAGNOSIS — Z720 Tobacco use: Secondary | ICD-10-CM | POA: Insufficient documentation

## 2021-11-05 DIAGNOSIS — I7101 Dissection of ascending aorta: Secondary | ICD-10-CM | POA: Diagnosis not present

## 2021-11-05 DIAGNOSIS — D72829 Elevated white blood cell count, unspecified: Secondary | ICD-10-CM | POA: Diagnosis not present

## 2021-11-05 LAB — CBC
HCT: 24.9 % — ABNORMAL LOW (ref 36.0–46.0)
Hemoglobin: 7.9 g/dL — ABNORMAL LOW (ref 12.0–15.0)
MCH: 28.7 pg (ref 26.0–34.0)
MCHC: 31.7 g/dL (ref 30.0–36.0)
MCV: 90.5 fL (ref 80.0–100.0)
Platelets: 276 10*3/uL (ref 150–400)
RBC: 2.75 MIL/uL — ABNORMAL LOW (ref 3.87–5.11)
RDW: 13.8 % (ref 11.5–15.5)
WBC: 5.4 10*3/uL (ref 4.0–10.5)
nRBC: 0 % (ref 0.0–0.2)

## 2021-11-05 LAB — RENAL FUNCTION PANEL
Albumin: 2.7 g/dL — ABNORMAL LOW (ref 3.5–5.0)
Anion gap: 8 (ref 5–15)
BUN: 9 mg/dL (ref 8–23)
CO2: 24 mmol/L (ref 22–32)
Calcium: 8.2 mg/dL — ABNORMAL LOW (ref 8.9–10.3)
Chloride: 103 mmol/L (ref 98–111)
Creatinine, Ser: 0.51 mg/dL (ref 0.44–1.00)
GFR, Estimated: >60 mL/min (ref >60)
Glucose, Bld: 90 mg/dL (ref 70–99)
Phosphorus: 3.1 mg/dL (ref 2.5–4.6)
Potassium: 3.8 mmol/L (ref 3.5–5.1)
Sodium: 135 mmol/L (ref 135–145)

## 2021-11-05 LAB — LIPID PANEL
Cholesterol: 96 mg/dL (ref 0–200)
HDL: 42 mg/dL (ref >40)
LDL Cholesterol: 42 mg/dL (ref 0–99)
Total CHOL/HDL Ratio: 2.3 RATIO
Triglycerides: 61 mg/dL (ref <150)
VLDL: 12 mg/dL (ref 0–40)

## 2021-11-05 LAB — PROTIME-INR
INR: 1.3 — ABNORMAL HIGH (ref 0.8–1.2)
Prothrombin Time: 16.3 seconds — ABNORMAL HIGH (ref 11.4–15.2)

## 2021-11-05 MED ORDER — NITROGLYCERIN 0.4 MG SL SUBL
SUBLINGUAL_TABLET | SUBLINGUAL | Status: AC
  Filled 2021-11-05: qty 2

## 2021-11-05 MED ORDER — NITROGLYCERIN 0.4 MG SL SUBL: 0.8000 mg | SUBLINGUAL_TABLET | Freq: Once | SUBLINGUAL | Status: DC

## 2021-11-05 MED ORDER — ESMOLOL HCL-SODIUM CHLORIDE 2000 MG/100ML IV SOLN: 25.0000 ug/kg/min | INTRAVENOUS | Status: DC

## 2021-11-05 MED ORDER — CHLORHEXIDINE GLUCONATE CLOTH 2 % EX PADS: 6.0000 | MEDICATED_PAD | Freq: Every day | CUTANEOUS | Status: DC

## 2021-11-05 MED ORDER — VALACYCLOVIR HCL 500 MG PO TABS: 500.0000 mg | ORAL_TABLET | Freq: Every day | ORAL | Status: DC

## 2021-11-05 MED ORDER — IOHEXOL 350 MG/ML SOLN
95.0000 mL | Freq: Once | INTRAVENOUS | Status: AC | PRN
  Administered 2021-11-05: 95 mL via INTRAVENOUS

## 2021-11-05 MED ORDER — NITROGLYCERIN 0.4 MG SL SUBL
SUBLINGUAL_TABLET | SUBLINGUAL | Status: AC
  Administered 2021-11-05: 0.8 mg
  Filled 2021-11-05: qty 2

## 2021-11-05 MED ORDER — NITROGLYCERIN 0.4 MG SL SUBL: 0.8000 mg | SUBLINGUAL_TABLET | Freq: Once | SUBLINGUAL | 12 refills | Status: DC

## 2021-11-05 MED ORDER — POLYETHYLENE GLYCOL 3350 17 G PO PACK: 17.0000 g | PACK | Freq: Every day | ORAL | Status: DC

## 2021-11-05 MED ORDER — DOCUSATE SODIUM 100 MG PO CAPS
100.0000 mg | ORAL_CAPSULE | Freq: Two times a day (BID) | ORAL | 0 refills | Status: DC | PRN
Start: 2021-11-05 — End: 2021-12-01

## 2021-11-05 MED ORDER — POLYETHYLENE GLYCOL 3350 17 G PO PACK: 17.0000 g | PACK | Freq: Every day | ORAL | 0 refills | Status: DC | PRN

## 2021-11-05 MED ORDER — FENTANYL CITRATE PF 50 MCG/ML IJ SOSY: 25.0000 ug | PREFILLED_SYRINGE | INTRAMUSCULAR | 0 refills | Status: DC | PRN

## 2021-11-05 NOTE — Progress Notes (Signed)
Progress Note  Patient Name: Kristine Davidson Date of Encounter: 11/05/2021  Primary Cardiologist: Werner Lean, MD   Subjective   No CP, peripheral perfusion appropriate.  Inpatient Medications    Scheduled Meds:  Chlorhexidine Gluconate Cloth  6 each Topical Daily   nitroGLYCERIN  0.8 mg Sublingual Once   valACYclovir  500 mg Oral QHS   Continuous Infusions:  esmolol 25 mcg/kg/min (11/05/21 0924)   PRN Meds: docusate sodium, fentaNYL (SUBLIMAZE) injection, polyethylene glycol, traZODone   Vital Signs    Vitals:   11/05/21 1031 11/05/21 1032 11/05/21 1033 11/05/21 1034  BP:      Pulse: 69 70 66 69  Resp: 17 17 20 20   Temp:      TempSrc:      SpO2: 95% 96% 95% 95%  Weight:        Intake/Output Summary (Last 24 hours) at 11/05/2021 1109 Last data filed at 11/05/2021 1000 Gross per 24 hour  Intake 893.57 ml  Output 1050 ml  Net -156.43 ml   Filed Weights   11/05/21 0500  Weight: 53.7 kg    Telemetry    SR - Personally Reviewed  ECG    SR - Personally Reviewed  Physical Exam   GEN: No acute distress.   Neck: No JVD Cardiac: regular rhythm, normal rate, soft systolic murmur, no rubs, or gallops.  Respiratory: Clear to auscultation bilaterally. GI: Soft, nontender, non-distended  MS: No edema; No deformity. Neuro:  Nonfocal  Psych: Normal affect   Labs    Chemistry Recent Labs  Lab 11/04/21 1212 11/05/21 0610  NA 134* 135  K 4.3 3.8  CL 98 103  CO2 26 24  GLUCOSE 98 90  BUN 16 9  CREATININE 0.56 0.51  CALCIUM 8.9 8.2*  PROT 6.8  --   ALBUMIN 3.1* 2.7*  AST 19  --   ALT 17  --   ALKPHOS 84  --   BILITOT 0.5  --   GFRNONAA >60 >60  ANIONGAP 10 8     Hematology Recent Labs  Lab 11/04/21 1212 11/05/21 0610  WBC 6.0 5.4  RBC 3.33* 2.75*  HGB 9.8* 7.9*  HCT 30.6* 24.9*  MCV 91.9 90.5  MCH 29.4 28.7  MCHC 32.0 31.7  RDW 13.7 13.8  PLT 302 276    Cardiac EnzymesNo results for input(s): TROPONINI in the last  168 hours. No results for input(s): TROPIPOC in the last 168 hours.   BNPNo results for input(s): BNP, PROBNP in the last 168 hours.   DDimer No results for input(s): DDIMER in the last 168 hours.   Radiology    CT CORONARY MORPH W/CTA COR W/SCORE W/CA W/CM &/OR WO/CM  Result Date: 11/05/2021 EXAM: OVER-READ INTERPRETATION  CT CHEST The following report is an over-read performed by radiologist Dr. Vinnie Langton of [facility], [place] on 11/05/2021. This over-read does not include interpretation of cardiac or coronary anatomy or pathology. The coronary calcium score/coronary CTA interpretation by the cardiologist is attached. COMPARISON:  None. FINDINGS: Type A thoracic aortic aneurysm/dissection again noted, currently measuring 4.9 cm in diameter. This is incompletely imaged. The false lumen and dissection flap extend back to the aortic root, directly adjacent to the ostium of the right coronary artery which remains patent at this time. Scattered areas of linear scarring are noted in the lungs, most evident in the right middle lobe. Within the visualized portions of the thorax there are no suspicious appearing pulmonary nodules or masses, there is  no acute consolidative airspace disease, no pleural effusions, no pneumothorax and no lymphadenopathy. Visualized portions of the upper abdomen are unremarkable. There are no aggressive appearing lytic or blastic lesions noted in the visualized portions of the skeleton. IMPRESSION: 1. Known type A thoracic aortic aneurysm/dissection redemonstrated, as above. Electronically Signed   By: Vinnie Langton M.D.   On: 11/05/2021 10:32   ECHOCARDIOGRAM COMPLETE  Result Date: 11/04/2021    ECHOCARDIOGRAM REPORT   Patient Name:   [name] Date of Exam: 11/04/2021 Medical Rec #:  [id number]        Height:       65.0 in Accession #:    [id number]       Weight:       113.8 lb Date of Birth:  08-09-1951         BSA:          1.557 m Patient Age:    70  years         BP:           139/77 mmHg Patient Gender: F                HR:           74 bpm. Exam Location:  Outpatient Procedure: 2D Echo STAT ECHO Indications:    atrial fibrillation  History:        Patient has no prior history of Echocardiogram examinations.                 Suspected aortic dissection.  Sonographer:    Johny Chess RDCS Referring Phys: [id number] [place]  1. The mitral valve is normal in structure. No evidence of mitral valve regurgitation. No evidence of mitral stenosis.  2. Moderate pericardial effusion. The pericardial effusion is circumferential.  3. The inferior vena cava is dilated in size with <50% respiratory variability, suggesting right atrial pressure of 15 mmHg.  4. There is evidence of an aortic dissection with a dissection flap above the S inus of Valsalva. This begins to extendin to the aortic arch.  5. Left ventricular ejection fraction, by estimation, is 60 to 65%. The left ventricle has normal function. The left ventricle has no regional wall motion abnormalities. There is mild concentric left ventricular hypertrophy. Left ventricular diastolic parameters are consistent with Grade II diastolic dysfunction (pseudonormalization).  6. Right ventricular systolic function is normal. The right ventricular size is normal. There is mildly elevated pulmonary artery systolic pressure. The estimated right ventricular systolic pressure is 69.6 mmHg.  7. The aortic valve is tricuspid. Aortic valve regurgitation is mild. No aortic stenosis is present. Comparison(s): No prior Echocardiogram. Conclusion(s)/Recommendation(s): Emergent finding: Patient sent to ED for stat imaging. Discussed with Patient, CT surgery, ED receiving team. FINDINGS  Left Ventricle: Left ventricular ejection fraction, by estimation, is 60 to 65%. The left ventricle has normal function. The left ventricle has no regional wall motion abnormalities. The left ventricular internal cavity size was  normal in size. There is  mild concentric left ventricular hypertrophy. Left ventricular diastolic parameters are consistent with Grade II diastolic dysfunction (pseudonormalization). Right Ventricle: The right ventricular size is normal. No increase in right ventricular wall thickness. Right ventricular systolic function is normal. There is mildly elevated pulmonary artery systolic pressure. The tricuspid regurgitant velocity is 2.47  m/s, and with an assumed right atrial pressure of 15 mmHg, the estimated right ventricular systolic pressure is 78.9 mmHg. Left Atrium: Left atrial size was normal in  size. Right Atrium: Right atrial size was normal in size. Pericardium: A moderately sized pericardial effusion is present. The pericardial effusion is circumferential. Mitral Valve: The mitral valve is normal in structure. No evidence of mitral valve regurgitation. No evidence of mitral valve stenosis. Tricuspid Valve: The tricuspid valve is normal in structure. Tricuspid valve regurgitation is mild. Aortic Valve: The aortic valve is tricuspid. Aortic valve regurgitation is mild. No aortic stenosis is present. Pulmonic Valve: The pulmonic valve was grossly normal. Pulmonic valve regurgitation is trivial. No evidence of pulmonic stenosis. Aorta: There is evidence of an aortic dissection with a dissection flap above the S inus of Valsalva. This begins to extendin to the aortic arch. The aortic root is normal in size and structure. Venous: The inferior vena cava is dilated in size with less than 50% respiratory variability, suggesting right atrial pressure of 15 mmHg. IAS/Shunts: The atrial septum is grossly normal.  LEFT VENTRICLE PLAX 2D LVIDd:         3.90 cm   Diastology LVIDs:         2.60 cm   LV e' medial:    4.35 cm/s LV PW:         1.10 cm   LV E/e' medial:  20.7 LV IVS:        1.00 cm   LV e' lateral:   9.36 cm/s LVOT diam:     1.80 cm   LV E/e' lateral: 9.6 LV SV:         47 LV SV Index:   30 LVOT Area:     2.54  cm  RIGHT VENTRICLE         IVC TAPSE (M-mode): 1.9 cm  IVC diam: 3.20 cm LEFT ATRIUM             Index        RIGHT ATRIUM           Index LA diam:        3.10 cm 1.99 cm/m   RA Area:     16.80 cm LA Vol (A2C):   68.5 ml 44.01 ml/m  RA Volume:   46.30 ml  29.74 ml/m LA Vol (A4C):   47.3 ml 30.39 ml/m LA Biplane Vol: 58.4 ml 37.52 ml/m  AORTIC VALVE LVOT Vmax:   100.00 cm/s LVOT Vmean:  66.400 cm/s LVOT VTI:    0.186 m  AORTA Ao Root diam: 3.50 cm MITRAL VALVE               TRICUSPID VALVE MV Area (PHT): 4.15 cm    TR Peak grad:   24.4 mmHg MV Decel Time: 183 msec    TR Vmax:        247.00 cm/s MV E velocity: 90.00 cm/s MV A velocity: 85.70 cm/s  SHUNTS MV E/A ratio:  1.05        Systemic VTI:  0.19 m                            Systemic Diam: 1.80 cm Rudean Haskell MD Electronically signed by Rudean Haskell MD Signature Date/Time: 11/04/2021/12:19:24 PM    Final    CT Angio Chest/Abd/Pel for Dissection W and/or Wo Contrast  Result Date: 11/04/2021 CLINICAL DATA:  Abdominal pain, aortic dissection suspected EXAM: CT ANGIOGRAPHY CHEST, ABDOMEN AND PELVIS TECHNIQUE: Non-contrast CT of the chest was initially obtained. Multidetector CT imaging through the chest, abdomen and pelvis was performed using the  standard protocol during bolus administration of intravenous contrast. Multiplanar reconstructed images and MIPs were obtained and reviewed to evaluate the vascular anatomy. CONTRAST:  137mL OMNIPAQUE IOHEXOL 350 MG/ML SOLN COMPARISON:  None. FINDINGS: CTA CHEST FINDINGS Cardiovascular: There is a new dissection of the thoracic aorta extending from the ascending aorta to the mid arch. No extension into the descending portion. Great vessel origins are spared. Ascending aorta measures up to 4.7 cm. Cardiac size is increased. There is a new moderate pericardial effusion. No evidence of central pulmonary embolism. Mediastinum/Nodes: No enlarged nodes. Thyroid and esophagus are unremarkable.  Lungs/Pleura: No consolidation or mass. Minor subsegmental atelectasis/scarring. Musculoskeletal: No acute osseous abnormality. Review of the MIP images confirms the above findings. CTA ABDOMEN AND PELVIS FINDINGS VASCULAR Aorta: Normal in caliber.  No evidence of dissection.  Mixed plaque. Celiac: Patent without origin stenosis. SMA: Patent without origin stenosis. Renals: Patent without origin stenosis. IMA: Patent without origin stenosis. Inflow: Patent and normal in caliber.  Calcified plaque. Veins: Not well evaluated. Review of the MIP images confirms the above findings. NON-VASCULAR Hepatobiliary: No focal liver abnormality is seen. No gallstones, gallbladder wall thickening, or biliary dilatation. Pancreas: Unremarkable. Spleen: Unremarkable. Adrenals/Urinary Tract: Adrenals, kidneys, and bladder are unremarkable. Stomach/Bowel: Stomach is within normal limits. Bowel is normal in caliber. Lymphatic: No enlarged nodes. Reproductive: Unremarkable. Other: No abdominal wall hernia. Musculoskeletal: No acute osseous abnormality. Review of the MIP images confirms the above findings. IMPRESSION: Type A thoracic aortic dissection extending to the mid arch. Ascending thoracic aorta measures up to 4.7 cm. Great vessel origins are spared. New cardiomegaly and moderate pericardial effusion. These results were called by telephone at the time of interpretation on 11/04/2021 at 12:54 pm to provider [facility] , who verbally acknowledged these results. Electronically Signed   By: Macy Mis M.D.   On: 11/04/2021 13:04    Cardiac Studies   As above  Patient Profile     70 y.o. female with TAA, current smoking and new PAF chads2vasc 2 who presents with Type A aortic dissection and pericardial effusion.   Assessment & Plan   Active Problems:   Acute thoracic aortic dissection   I spoken to Ms. Orlich regarding the findings on her echocardiogram, CTA from yesterday, and consultations from critical care  and cardiac surgery.  Plan is for repair of ascending aortic dissection, however surgical date not immediately available, in addition the patient requires washout of Eliquis, which has been held since 11/05/2019 2 PM.  5 days total requested by cardiac surgery.  She underwent coronary CT today however due to technical factors bolus timing suboptimal and there is a questionable lesion on preliminary review of the images in the proximal to mid RCA.  Would recommend repeat scan which we will arrange.  She is overall hemodynamically stable and continues on esmolol infusion.  Arterial line appears high fidelity and blood pressure is approximately 130s over 60s.  Heart rate stable in the 70s.  Of discussed with critical care that if transfer to [place] is required, we are happy to facilitate in any way that we can help.  We will await their conversation with the patient, however patient is amenable to transfer to [facility] if that is what is recommended.  For questions or updates, please contact [place] Please consult [email or web address] for contact info under   CRITICAL CARE Performed by: Cherlynn Kaiser, MD   Total critical care time: 45 minutes   Critical care time was exclusive of separately billable procedures and  treating other patients.   Critical care was necessary to treat or prevent imminent or life-threatening deterioration.   Critical care was time spent personally by me (independent of APPs or residents) on the following activities: development of treatment plan with patient and/or surrogate as well as nursing, discussions with consultants, evaluation of patient's response to treatment, examination of patient, obtaining history from patient or surrogate, ordering and performing treatments and interventions, ordering and review of laboratory studies, ordering and review of radiographic studies, pulse oximetry and re-evaluation of patient's condition.       Signed, Elouise Munroe, MD   11/05/2021, 11:09 AM

## 2021-11-05 NOTE — Progress Notes (Signed)
No change from earlier exam Erick Colace ACNP-BC [place] Pager # [phone] OR # [phone] if no answer

## 2021-11-05 NOTE — Progress Notes (Addendum)
NAME:  ZHARIA CONROW, MRN:  [id number], DOB:  1951/04/11, LOS: 1 ADMISSION DATE:  11/04/2021, CONSULTATION DATE:  11/04/2021 REFERRING MD:  Dr. Roslynn Amble, CHIEF COMPLAINT:  aortic dissection    History of Present Illness:  70 year old female with history as below presenting from cardiology office with abnormal echo.   Patient states that she had an episode of severe pain extending to throat to epigastric that lasted around 12 hours.  She took aleve with some improvement but did not seek evaluation till the next day.  She went to her PCP, per chart on 11/3, where she was found to be in atrial fibrillation.  She was was started on diltiazem and referred to the afib clinic where she was placed on Eliquis 5mg  BID on 11/7.  Her last dose was this morning.  She went for her echo today, where they saw evidence of an aortic dissection and moderate circumferential pericardial effusion and therefore sent to ER for further evaluation.  She also endorses some exertional shortness of breath which started prior to her chest discomfort.  She has smoked since she was 16, 3/4 pack per day at her heaviest, but currently taking few puffs a day since she was diagnosed with afib.    In ER, her initial BP was 125/76 and HR NSR 76.  She underwent CTA of chest and abdomen which revealed a type A thoracic aortic dissection extending to mid arch, measuring 4.7 cm, with moderate pericardial effusion and new cardiomegaly.  TCTS consulted in ER and she was started on esmolol.  Given recent Eliquis, PCCM asked to admit for initial medical management.   Pertinent  Medical History  Tobacco abuse, Ascending aortic aneurysm, hypothyroidism, aortic atherosclerosis, MVP, CAD, HLD, Afib on Eliquis, anxiety, arthritis, diverticular disease, osteoporosis, HSV2  Significant Hospital Events: Including procedures, antibiotic start and stop dates in addition to other pertinent events   Admitted with type A aortic dissection, medical  management for now on esmolol/ medical management.  TCTS consulted  11/15 CTA chest/abd >> Type A thoracic aortic dissection extending to the mid arch.  Ascending thoracic aorta measures up to 4.7 cm.  Great vessel origins are spared.  New cardiomegaly and moderate pericardial effusion.  11/15 TTE >> moderate circumferential pericardial effusion, IVC dilated suggesting RA pressure 15 mmHg, evidence of aortic dissection with dissection flap above the sinus of valsalva, then begin to extend to aortic arch, mild AR, LVEF 60-65%, no RWA, mild LVH, G2DD, normal RV, elevated RVSP 39.4  Interim History / Subjective:  Remains on esmolol. Still has some chest fullness that has been there for several days.  Objective   Blood pressure 115/76, pulse 69, temperature 98.1 F (36.7 C), temperature source Oral, resp. rate 13, weight 53.7 kg, SpO2 98 %.        Intake/Output Summary (Last 24 hours) at 11/05/2021 1323 Last data filed at 11/05/2021 1200 Gross per 24 hour  Intake 920.5 ml  Output 1225 ml  Net -304.5 ml   Filed Weights   11/05/21 0500  Weight: 53.7 kg    Examination: General:  thin elderly woman sitting up in bed in NAD HEENT: [place]/AT, eyes anicteric Neuro: awake and alert, moving all extremities, face symmetric, answering questions appropriately CV: S1S2, reg rate, irreg rhythm PULM:  breathing comfortably on RA GI: soft, NT Extremities: no c/c/e Skin: warm, dry, no rashes  Labs reviewed. H/H 7.9/24.9, down from baseline Hb~12 Echo: mild AI, moderate pericardial effusion   Resolved Hospital Problem  list    Assessment & Plan:   Type A aortic dissection from aortic root to mid-arch Pericardial effusion -Appreciate TCTS input. Called transfer center at [facility]-- she has been accepted to their cardiothoracic surgery service. -Labetalol PRN to maintain SBP <120, HR <80 -NPO -eliquis held, reversed with andexant alpha at admission  PAF on Eliquis Prior CHA2DS2-VASc score  3 -monitor on tele -hold AC  HLD -con't statin  Hypothyroidism -synthroid  Anxiety/ depression -holding paxil & effexor  Emphysema on CT, no PFTs Tobacco abuse -encouraged cessation efforts, does not need nicotine patch  Best Practice (right click and "Reselect all SmartList Selections" daily)   Diet/type: NPO DVT prophylaxis: SCD GI prophylaxis: N/A Lines: N/A Foley:  N/A Code Status:  full code Last date of multidisciplinary goals of care discussion [11/16]  Labs   CBC: Recent Labs  Lab 11/04/21 1212 11/05/21 0610  WBC 6.0 5.4  NEUTROABS 4.1  --   HGB 9.8* 7.9*  HCT 30.6* 24.9*  MCV 91.9 90.5  PLT 302 276     Basic Metabolic Panel: Recent Labs  Lab 11/04/21 1212 11/05/21 0610  NA 134* 135  K 4.3 3.8  CL 98 103  CO2 26 24  GLUCOSE 98 90  BUN 16 9  CREATININE 0.56 0.51  CALCIUM 8.9 8.2*  PHOS  --  3.1    GFR: Estimated Creatinine Clearance: 53.9 mL/min (by C-G formula based on SCr of 0.51 mg/dL). Recent Labs  Lab 11/04/21 1212 11/05/21 0610  WBC 6.0 5.4      This patient is critically ill with multiple organ system failure which requires frequent high complexity decision making, assessment, support, evaluation, and titration of therapies. This was completed through the application of advanced monitoring technologies and extensive interpretation of multiple databases. During this encounter critical care time was devoted to patient care services described in this note for 50 minutes.  Julian Hy, DO 11/05/21 1:31 PM [facility]

## 2021-11-05 NOTE — H&P (Incomplete)
NAME:  Kristine Davidson, MRN:  [id number], DOB:  [birth year], LOS: 1 ADMISSION DATE:  11/04/2021, CONSULTATION DATE:  11/04/2021 REFERRING MD:  Dr. Roslynn Amble, CHIEF COMPLAINT:  aortic dissection    History of Present Illness:   70 year old female with history as below presenting from cardiology office with abnormal echo.   Patient states that she had an episode of severe pain extending to throat to epigastric that lasted around 12 hours.  She took aleve with some improvement but did not seek evaluation till the next day.  She went to her PCP, per chart on 11/3, where she was found to be in atrial fibrillation.  She was was started on diltiazem and referred to the afib clinic where she was placed on Eliquis 5mg  BID on 11/7.  Her last dose was this morning.  She went for her echo today, where they saw evidence of an aortic dissection and moderate circumferential pericardial effusion and therefore sent to ER for further evaluation.  She also endorses some exertional shortness of breath which started prior to her chest discomfort.  She has smoked since she was 16, 3/4 pack per day at her heaviest, but currently taking few puffs a day since she was diagnosed with afib.    In ER, her initial BP was 125/76 and HR NSR 76.  She underwent CTA of chest and abdomen which revealed a type A thoracic aortic dissection extending to mid arch, measuring 4.7 cm, with moderate pericardial effusion and new cardiomegaly.  TCTS consulted in ER and she was started on esmolol.  Given recent Eliquis, PCCM asked to admit for initial medical management.   Pertinent  Medical History  Ascending aortic aneurysm Hypothyroidism Aortic atherosclerosis MVP, CAD, HLD, Afib on Eliquis Anxiety Arthritis Diverticular disease Osteoporosis HSV2 Tobacco abuse  Significant Hospital Events: Including procedures, antibiotic start and stop dates in addition to other pertinent events   11/15: Admitted with type A aortic dissection,  medical management for now on esmolol/ medical management.  TCTS consulted. A line placed for close BP monitoring.  11/16: Cont. Esmolol gtt, Gated Cardiac CTA today  Imaging Results: 11/15: CTA chest/abd: Type A thoracic aortic dissection extending to the mid arch.  Ascending thoracic aorta measures up to 4.7 cm.  Great vessel origins are spared.  New cardiomegaly and moderate pericardial effusion.   11/15: TTE: moderate circumferential pericardial effusion, IVC dilated suggesting RA pressure 15 mmHg, evidence of aortic dissection with dissection flap above the sinus of valsalva, then begin to extend to aortic arch, mild AR, LVEF 60-65%, no RWA, mild LVH, G2DD, normal RV, elevated RVSP 39.4  11/16: CCTA: completed, results pending. Interim History / Subjective:  Pt on bedpan, then en route  CTA. Unable to retrieve subjective data at this time   Objective   Blood pressure 106/74, pulse 70, temperature (P) 99.1 F (37.3 C), temperature source (P) Oral, resp. rate 18, weight 53.7 kg, SpO2 94 %.        Intake/Output Summary (Last 24 hours) at 11/05/2021 0931 Last data filed at 11/05/2021 0855 Gross per 24 hour  Intake 893.57 ml  Output 700 ml  Net 193.57 ml   Filed Weights   11/05/21 0500  Weight: 53.7 kg    Examination: ***  General: Frail appearing, resting in bed.  HEENT: Neuro: Resp: CV: GI: GU: Skin: Extremities:    Resolved Hospital Problem list    Assessment & Plan:   Type A aortic dissection  Pericardial effusion Pain r/t above  Hemoglobin down trending: 7.9 PTT, INR down trending Gated Cardiac CTA in process Aline placed for BP mgmt Plan: - Esmolol for SBP < 110, HR goal < 60, currently at 25 mcg/kg/min, - Cont. Hold home eliquis: May need reversal (last dose 11/15 AM) - Monitor for S/Sx of worsening pericardial effusion/tamponade - OR for repair 5 days post eliquis washout.  - limited OR times d/t holiday schedule, consider transfer to [facility]/other  facility for sooner surgical mgmt - Active T&S - Trend: CBC, CMP - Diet ordered per Surgery - Strict I&O's - Bowel regimen ordered to prevent straining - PRN fentanyl for pain  PAF on Eliquis Prior CHA2DS2-VASc score 3 HR: NSR Plan: - Cont to hold home eliquis - Monitor HR  HLD Plan: - Hold home statin  Hypothyroidism Increased lethargy  Plan: - resume home synthroid  Anxiety/ depression Plan: - holding wellbutrin and paxil  Tobacco abuse Has almost quit Plan: - Discussed smoking cessation  Best Practice (right click and "Reselect all SmartList Selections" daily)   Diet/type: Regular consistency (see orders) DVT prophylaxis: SCD GI prophylaxis: N/A Lines: N/A Foley:  N/A Code Status:  full code Last date of multidisciplinary goals of care discussion [11/15]- patient lives alone.  Her NOK is her brother coming into town today and her sister.  She has no children.   Labs   CBC: Recent Labs  Lab 11/04/21 1212 11/05/21 0610  WBC 6.0 5.4  NEUTROABS 4.1  --   HGB 9.8* 7.9*  HCT 30.6* 24.9*  MCV 91.9 90.5  PLT 302 338    Basic Metabolic Panel: Recent Labs  Lab 11/04/21 1212 11/05/21 0610  NA 134* 135  K 4.3 3.8  CL 98 103  CO2 26 24  GLUCOSE 98 90  BUN 16 9  CREATININE 0.56 0.51  CALCIUM 8.9 8.2*  PHOS  --  3.1   GFR: Estimated Creatinine Clearance: 53.9 mL/min (by C-G formula based on SCr of 0.51 mg/dL). Recent Labs  Lab 11/04/21 1212 11/05/21 0610  WBC 6.0 5.4    Liver Function Tests: Recent Labs  Lab 11/04/21 1212 11/05/21 0610  AST 19  --   ALT 17  --   ALKPHOS 84  --   BILITOT 0.5  --   PROT 6.8  --   ALBUMIN 3.1* 2.7*   No results for input(s): LIPASE, AMYLASE in the last 168 hours. No results for input(s): AMMONIA in the last 168 hours.  ABG No results found for: PHART, PCO2ART, PO2ART, HCO3, TCO2, ACIDBASEDEF, O2SAT   Coagulation Profile: Recent Labs  Lab 11/04/21 1316 11/05/21 0610  INR 1.6* 1.3*    Cardiac  Enzymes: No results for input(s): CKTOTAL, CKMB, CKMBINDEX, TROPONINI in the last 168 hours.  HbA1C: No results found for: HGBA1C  CBG: No results for input(s): GLUCAP in the last 168 hours.  Review of Systems:    General: negative for fever, chills, aches Neuro: negative Resp: int SOB w exertion, negative cough, sputum production CV: denies Chest pain at this time, negative for palpitations, lightheadedness, leg swelling GI: Positive for int abdominal pain, negative N/V/D GU: negative Skin: negative   Past Medical History:  She,  has a past medical history of Allergy, Anemia, Anxiety, Aortic atherosclerosis ([place]), Arthritis, B12 deficiency (03/31/2012), Depression, Diverticular disease, Emphysema of lung ([place]), HSV-2 (herpes simplex virus 2) infection, Hyperlipidemia, Hypertension, Mitral valve prolapse, Osteoporosis (01/14/2018), Substance abuse ([place]), Thoracic aortic ectasia ([place]), Thyroid disease, Tobacco dependence, and Vitamin D deficiency.   Surgical History:  Past Surgical History:  Procedure Laterality Date   COLONOSCOPY     D and C     TONSILLECTOMY     VARICOSE VEIN SURGERY  2000     Social History:   reports that she has been smoking cigarettes. She has a 21.50 pack-year smoking history. She has never used smokeless tobacco. She reports that she does not drink alcohol and does not use drugs.   Family History:  Her family history includes Breast cancer (age of onset: 38) in her mother; Diabetes in her sister; Macular degeneration in her mother; Parkinson's disease in her father and sister; Stroke in her mother. There is no history of Colon polyps, Colon cancer, Esophageal cancer, Stomach cancer, or Rectal cancer.   Allergies Allergies  Allergen Reactions   Codeine Itching     Home Medications  Prior to Admission medications   Medication Sig Start Date End Date Taking? Authorizing Provider  acetaminophen (TYLENOL) 325 MG tablet Take 325-650 mg by mouth every 6  (six) hours as needed for fever or mild pain.   Yes [provider]  apixaban (ELIQUIS) 5 MG TABS tablet Take 1 tablet (5 mg total) by mouth 2 (two) times daily. 10/27/21  Yes Fenton, Clint R, PA  buPROPion (WELLBUTRIN XL) 150 MG 24 hr tablet Take 1 tablet (150 mg total) by mouth daily. Patient taking differently: Take 150 mg by mouth in the morning. 12/07/19  Yes Wendie Agreste, MD  diltiazem (CARDIZEM CD) 120 MG 24 hr capsule Take 120 mg by mouth in the morning.   Yes [provider]  levothyroxine (SYNTHROID) 50 MCG tablet TAKE 1 TABLET (50 MCG TOTAL) BY MOUTH DAILY. Patient taking differently: Take 50 mcg by mouth at bedtime. 12/07/19  Yes Wendie Agreste, MD  MAGNESIUM PO Take 200 mg by mouth daily as needed (for constipation).   Yes [provider]  PARoxetine (PAXIL) 30 MG tablet Take 1 tablet (30 mg total) by mouth at bedtime. Patient taking differently: Take 30 mg by mouth in the morning. 12/07/19  Yes Wendie Agreste, MD  rosuvastatin (CRESTOR) 20 MG tablet Take 20 mg by mouth in the morning and at bedtime.   Yes [provider]  traZODone (DESYREL) 50 MG tablet Take 1-2 tablets (50-100 mg total) by mouth at bedtime. Patient taking differently: Take 50 mg by mouth at bedtime. 12/07/19  Yes Wendie Agreste, MD  valACYclovir (VALTREX) 1000 MG tablet Take 1 tablet (1,000 mg total) by mouth daily. Patient taking differently: Take 500 mg by mouth at bedtime. 12/07/19  Yes Wendie Agreste, MD     Critical care time:

## 2021-11-05 NOTE — Discharge Summary (Signed)
Physician Discharge Summary         Patient ID: Kristine Davidson MRN: [id number] DOB/AGE: [birth year] 70 y.o.  Admit date: 11/04/2021 Discharge date: 11/05/2021  Discharge Diagnoses:    Ascending aortic aneurysm Type A aortic dissection  Pericardial effusion Pain r/t above Hypothyroidism Aortic atherosclerosis MVP CAD HLD fib on Eliquis Anxiety Arthritis Diverticular disease Osteoporosis HSV2 Tobacco abuse   Discharge summary    70 year old female with history as below presenting from cardiology office with abnormal echo.  Patient states that she had an episode of severe pain extending to throat to epigastric that lasted around 12 hours.  She took aleve with some improvement but did not seek evaluation till the next day.  She went to her PCP, per chart on 11/3, where she was found to be in atrial fibrillation.  She was was started on diltiazem and referred to the afib clinic where she was placed on Eliquis 5mg  BID on 11/7.  Her last dose was 11/15.  An echocardiogram was obtained per cardiology which showed evidence of an aortic dissection and moderate circumferential pericardial effusion and therefore sent to ER for further evaluation.  She also endorsed some exertional shortness of breath which started prior to her chest discomfort.  She has smoked since she was 16, 3/4 pack per day at her heaviest, but currently taking few puffs a day since she was diagnosed with afib.     In ER, her initial BP was 125/76 and HR NSR 76.  She underwent CTA of chest and abdomen which revealed a type A thoracic aortic dissection extending to mid arch, measuring 4.7 cm, with moderate pericardial effusion and new cardiomegaly.  TCTS consulted in ER and she was started on esmolol.  Given recent Eliquis, PCCM asked to admit for initial medical management.   Hospital course  11/15: Admitted with type A aortic dissection, medical management for now on esmolol/ medical management.  TCTS consulted. A  line placed for close BP monitoring.  Thoracic surgery input: 7 day old type A aortic dissection involving the ascending aorta and arch with aneurysmal disease involving both with a maximum diameter of 4.7 cm. There is a moderate pericardial effusion. It appears to begin just beyond the STJ. The aortic valve is functioning normally with mild central AI and an aortic root diameter of 4 cm with a well defined STJ measuring 3 cm. I think she will require replacement of the ascending aorta and arch from the STJ to the left subclavian region. This will require axillary artery cannulation and circulatory arrest with bypass to the innominate, left carotid and probably left subclavian arteries. She has been on Eliquis until this am and ideally should be off of it for at least 5 days before major surgery. She has been asymptomatic since her initial event 7 days ago and I think the goal at this point is tight BP control with beta blocker and delayed surgery after the Eliquis has worn off. She is receiving ANDEXXA which may help but is not completely reliable in resolving the coagulopathy from Eliquis especially in a surgery associated with exacerbating coagulopathy. She also has some coronary calcification and is an ongoing heavy smoker so I think she needs coronary assessment with a gated cardiac CTA which has been scheduled by cardiology for tomorrow. Our operative schedule through the Thanksgiving holiday is currently full with only 3 surgeons on staff and it may be best to consider transfer to [facility] for surgery if they can do  it sooner. I discussed all of this with her.  11/16: Cont. Esmolol gtt, Gated Cardiac CTA completed. [facility] contacted and accepted in consult   Pertinent  Medical History  Ascending aortic aneurysm Hypothyroidism Aortic atherosclerosis MVP, CAD, HLD, Afib on Eliquis Anxiety Arthritis Diverticular disease Osteoporosis HSV2 Tobacco abuse  Discharge Plan by Active Problems     Type A  aortic dissection  and Pericardial effusion w/ associated Pain Plan: Cont esmolol gtt for ABP < 110 and HR < 60 Holding DOAC (last dose was 11/15) To [facility] for thoracic surgery eval  NPO    PAF on Eliquis Prior CHA2DS2-VASc score 3; current HR: NSR Plan: Holding DOAC as above Tele    HLD Plan: Holding statin    Hypothyroidism Plan: Cont synthroid    Anxiety/ depression Plan: Resume Wellbutrin and Paxil when able   Tobacco abuse Has almost quit Plan: Smoking cessation   Consults  Thoracic surgery and cardiology     Discharge Exam: Blood Pressure 115/76   Pulse 69   Temperature 98.1 F (36.7 C) (Oral)   Respiration 13   Weight 53.7 kg   Oxygen Saturation 98%   Body Mass Index 19.70 kg/m   General pleasant 70 year old female resting in bed HENT NCAT no JVD Pulm clear Card rrr Abd soft  Ext warm and dry  Neuro intact  Labs at discharge   Lab Results  Component Value Date   CREATININE 0.51 11/05/2021   BUN 9 11/05/2021   NA 135 11/05/2021   K 3.8 11/05/2021   CL 103 11/05/2021   CO2 24 11/05/2021   Lab Results  Component Value Date   WBC 5.4 11/05/2021   HGB 7.9 (L) 11/05/2021   HCT 24.9 (L) 11/05/2021   MCV 90.5 11/05/2021   PLT 276 11/05/2021   Lab Results  Component Value Date   ALT 17 11/04/2021   AST 19 11/04/2021   ALKPHOS 84 11/04/2021   BILITOT 0.5 11/04/2021   Lab Results  Component Value Date   INR 1.3 (H) 11/05/2021   INR 1.6 (H) 11/04/2021    Current radiological studies    CT CORONARY MORPH W/CTA COR W/SCORE W/CA W/CM &/OR WO/CM  Result Date: 11/05/2021 EXAM: OVER-READ INTERPRETATION  CT CHEST The following report is an over-read performed by radiologist Dr. Vinnie Langton of [facility] on 11/05/2021. This over-read does not include interpretation of cardiac or coronary anatomy or pathology. The coronary calcium score/coronary CTA interpretation by the cardiologist is attached. COMPARISON:  None. FINDINGS:  Type A thoracic aortic aneurysm/dissection again noted, currently measuring 4.9 cm in diameter. This is incompletely imaged. The false lumen and dissection flap extend back to the aortic root, directly adjacent to the ostium of the right coronary artery which remains patent at this time. Scattered areas of linear scarring are noted in the lungs, most evident in the right middle lobe. Within the visualized portions of the thorax there are no suspicious appearing pulmonary nodules or masses, there is no acute consolidative airspace disease, no pleural effusions, no pneumothorax and no lymphadenopathy. Visualized portions of the upper abdomen are unremarkable. There are no aggressive appearing lytic or blastic lesions noted in the visualized portions of the skeleton. IMPRESSION: 1. Known type A thoracic aortic aneurysm/dissection redemonstrated, as above. Electronically Signed   By: Vinnie Langton M.D.   On: 11/05/2021 10:32   ECHOCARDIOGRAM COMPLETE  Result Date: 11/04/2021    ECHOCARDIOGRAM REPORT   Patient Name:   Kristine Davidson Date  of Exam: 11/04/2021 Medical Rec #:  [id number]        Height:       65.0 in Accession #:    [id number]       Weight:       113.8 lb Date of Birth:  1951-09-04         BSA:          1.557 m Patient Age:    29 years         BP:           139/77 mmHg Patient Gender: F                HR:           74 bpm. Exam Location:  Outpatient Procedure: 2D Echo STAT ECHO Indications:    atrial fibrillation  History:        Patient has no prior history of Echocardiogram examinations.                 Suspected aortic dissection.  Sonographer:    Johny Chess RDCS Referring Phys: [id number] [place]  1. The mitral valve is normal in structure. No evidence of mitral valve regurgitation. No evidence of mitral stenosis.  2. Moderate pericardial effusion. The pericardial effusion is circumferential.  3. The inferior vena cava is dilated in size with <50% respiratory variability,  suggesting right atrial pressure of 15 mmHg.  4. There is evidence of an aortic dissection with a dissection flap above the S inus of Valsalva. This begins to extendin to the aortic arch.  5. Left ventricular ejection fraction, by estimation, is 60 to 65%. The left ventricle has normal function. The left ventricle has no regional wall motion abnormalities. There is mild concentric left ventricular hypertrophy. Left ventricular diastolic parameters are consistent with Grade II diastolic dysfunction (pseudonormalization).  6. Right ventricular systolic function is normal. The right ventricular size is normal. There is mildly elevated pulmonary artery systolic pressure. The estimated right ventricular systolic pressure is 86.7 mmHg.  7. The aortic valve is tricuspid. Aortic valve regurgitation is mild. No aortic stenosis is present. Comparison(s): No prior Echocardiogram. Conclusion(s)/Recommendation(s): Emergent finding: Patient sent to ED for stat imaging. Discussed with Patient, CT surgery, ED receiving team. FINDINGS  Left Ventricle: Left ventricular ejection fraction, by estimation, is 60 to 65%. The left ventricle has normal function. The left ventricle has no regional wall motion abnormalities. The left ventricular internal cavity size was normal in size. There is  mild concentric left ventricular hypertrophy. Left ventricular diastolic parameters are consistent with Grade II diastolic dysfunction (pseudonormalization). Right Ventricle: The right ventricular size is normal. No increase in right ventricular wall thickness. Right ventricular systolic function is normal. There is mildly elevated pulmonary artery systolic pressure. The tricuspid regurgitant velocity is 2.47  m/s, and with an assumed right atrial pressure of 15 mmHg, the estimated right ventricular systolic pressure is 67.2 mmHg. Left Atrium: Left atrial size was normal in size. Right Atrium: Right atrial size was normal in size. Pericardium: A  moderately sized pericardial effusion is present. The pericardial effusion is circumferential. Mitral Valve: The mitral valve is normal in structure. No evidence of mitral valve regurgitation. No evidence of mitral valve stenosis. Tricuspid Valve: The tricuspid valve is normal in structure. Tricuspid valve regurgitation is mild. Aortic Valve: The aortic valve is tricuspid. Aortic valve regurgitation is mild. No aortic stenosis is present. Pulmonic Valve: The pulmonic valve was grossly normal. Pulmonic valve regurgitation  is trivial. No evidence of pulmonic stenosis. Aorta: There is evidence of an aortic dissection with a dissection flap above the S inus of Valsalva. This begins to extendin to the aortic arch. The aortic root is normal in size and structure. Venous: The inferior vena cava is dilated in size with less than 50% respiratory variability, suggesting right atrial pressure of 15 mmHg. IAS/Shunts: The atrial septum is grossly normal.  LEFT VENTRICLE PLAX 2D LVIDd:         3.90 cm   Diastology LVIDs:         2.60 cm   LV e' medial:    4.35 cm/s LV PW:         1.10 cm   LV E/e' medial:  20.7 LV IVS:        1.00 cm   LV e' lateral:   9.36 cm/s LVOT diam:     1.80 cm   LV E/e' lateral: 9.6 LV SV:         47 LV SV Index:   30 LVOT Area:     2.54 cm  RIGHT VENTRICLE         IVC TAPSE (M-mode): 1.9 cm  IVC diam: 3.20 cm LEFT ATRIUM             Index        RIGHT ATRIUM           Index LA diam:        3.10 cm 1.99 cm/m   RA Area:     16.80 cm LA Vol (A2C):   68.5 ml 44.01 ml/m  RA Volume:   46.30 ml  29.74 ml/m LA Vol (A4C):   47.3 ml 30.39 ml/m LA Biplane Vol: 58.4 ml 37.52 ml/m  AORTIC VALVE LVOT Vmax:   100.00 cm/s LVOT Vmean:  66.400 cm/s LVOT VTI:    0.186 m  AORTA Ao Root diam: 3.50 cm MITRAL VALVE               TRICUSPID VALVE MV Area (PHT): 4.15 cm    TR Peak grad:   24.4 mmHg MV Decel Time: 183 msec    TR Vmax:        247.00 cm/s MV E velocity: 90.00 cm/s MV A velocity: 85.70 cm/s  SHUNTS MV E/A  ratio:  1.05        Systemic VTI:  0.19 m                            Systemic Diam: 1.80 cm Rudean Haskell MD Electronically signed by Rudean Haskell MD Signature Date/Time: 11/04/2021/12:19:24 PM    Final    CT Angio Chest/Abd/Pel for Dissection W and/or Wo Contrast  Result Date: 11/04/2021 CLINICAL DATA:  Abdominal pain, aortic dissection suspected EXAM: CT ANGIOGRAPHY CHEST, ABDOMEN AND PELVIS TECHNIQUE: Non-contrast CT of the chest was initially obtained. Multidetector CT imaging through the chest, abdomen and pelvis was performed using the standard protocol during bolus administration of intravenous contrast. Multiplanar reconstructed images and MIPs were obtained and reviewed to evaluate the vascular anatomy. CONTRAST:  149mL OMNIPAQUE IOHEXOL 350 MG/ML SOLN COMPARISON:  None. FINDINGS: CTA CHEST FINDINGS Cardiovascular: There is a new dissection of the thoracic aorta extending from the ascending aorta to the mid arch. No extension into the descending portion. Great vessel origins are spared. Ascending aorta measures up to 4.7 cm. Cardiac size is increased. There is a new moderate pericardial effusion. No  evidence of central pulmonary embolism. Mediastinum/Nodes: No enlarged nodes. Thyroid and esophagus are unremarkable. Lungs/Pleura: No consolidation or mass. Minor subsegmental atelectasis/scarring. Musculoskeletal: No acute osseous abnormality. Review of the MIP images confirms the above findings. CTA ABDOMEN AND PELVIS FINDINGS VASCULAR Aorta: Normal in caliber.  No evidence of dissection.  Mixed plaque. Celiac: Patent without origin stenosis. SMA: Patent without origin stenosis. Renals: Patent without origin stenosis. IMA: Patent without origin stenosis. Inflow: Patent and normal in caliber.  Calcified plaque. Veins: Not well evaluated. Review of the MIP images confirms the above findings. NON-VASCULAR Hepatobiliary: No focal liver abnormality is seen. No gallstones, gallbladder wall  thickening, or biliary dilatation. Pancreas: Unremarkable. Spleen: Unremarkable. Adrenals/Urinary Tract: Adrenals, kidneys, and bladder are unremarkable. Stomach/Bowel: Stomach is within normal limits. Bowel is normal in caliber. Lymphatic: No enlarged nodes. Reproductive: Unremarkable. Other: No abdominal wall hernia. Musculoskeletal: No acute osseous abnormality. Review of the MIP images confirms the above findings. IMPRESSION: Type A thoracic aortic dissection extending to the mid arch. Ascending thoracic aorta measures up to 4.7 cm. Great vessel origins are spared. New cardiomegaly and moderate pericardial effusion. These results were called by telephone at the time of interpretation on 11/04/2021 at 12:54 pm to provider [facility] , who verbally acknowledged these results. Electronically Signed   By: Macy Mis M.D.   On: 11/04/2021 13:04    Disposition:    Discharge disposition: 51-Hospice/Medical Facility      Discharge Instructions     Increase activity slowly   Complete by: As directed        Allergies as of 11/05/2021     Allergen Reactions Comments   Codeine Itching         Medication List     Stop taking these medications    acetaminophen 325 MG tablet Commonly known as: TYLENOL   apixaban 5 MG Tabs tablet Commonly known as: ELIQUIS   buPROPion 150 MG 24 hr tablet Commonly known as: WELLBUTRIN XL   diltiazem 120 MG 24 hr capsule Commonly known as: CARDIZEM CD   MAGNESIUM PO   PARoxetine 30 MG tablet Commonly known as: PAXIL   rosuvastatin 20 MG tablet Commonly known as: CRESTOR       Take these medications    docusate sodium 100 MG capsule Commonly known as: COLACE Take 1 capsule (100 mg total) by mouth 2 (two) times daily as needed for mild constipation.   esmolol 2000 mg / 100 mL infusion Commonly known as: BREVIBLOC Inject 1.29-15.48 mg/min into the vein continuous.   fentaNYL 50 MCG/ML injection Commonly known as:  SUBLIMAZE Inject 0.5-1 mLs (25-50 mcg total) into the vein every 2 (two) hours as needed for severe pain or moderate pain.   levothyroxine 50 MCG tablet Commonly known as: SYNTHROID TAKE 1 TABLET (50 MCG TOTAL) BY MOUTH DAILY. What changed:  how much to take how to take this when to take this additional instructions   nitroGLYCERIN 0.4 MG SL tablet Commonly known as: NITROSTAT Place 2 tablets (0.8 mg total) under the tongue once for 1 dose.   polyethylene glycol 17 g packet Commonly known as: MIRALAX / GLYCOLAX Take 17 g by mouth daily as needed for moderate constipation.   traZODone 50 MG tablet Commonly known as: DESYREL Take 1-2 tablets (50-100 mg total) by mouth at bedtime. What changed: how much to take   valACYclovir 500 MG tablet Commonly known as: VALTREX Take 1 tablet (500 mg total) by mouth at bedtime. What changed:  medication strength how  much to take when to take this         Follow-up appointment   To be determined  Discharge Condition:    serious  Physician Statement:   The Patient was personally examined, the discharge assessment and plan has been personally reviewed and I agree with ACNP Tauri Ethington's assessment and plan. 35 min  minutes of time have been dedicated to discharge assessment, planning and discharge instructions.   Signed: Clementeen Graham 11/05/2021, 12:25 PM

## 2021-11-05 NOTE — Progress Notes (Signed)
[facility] transfer center called--accepted for transfer. Working on getting Ct and echo images powershared to [employer].  Julian Hy, DO 11/05/21 11:52 AM [facility]

## 2021-11-06 DIAGNOSIS — I71011 Dissection of aortic arch: Secondary | ICD-10-CM | POA: Diagnosis not present

## 2021-11-07 ENCOUNTER — Ambulatory Visit (HOSPITAL_COMMUNITY): Payer: Medicare Other | Admitting: Physician Assistant

## 2021-11-07 DIAGNOSIS — D72829 Elevated white blood cell count, unspecified: Secondary | ICD-10-CM | POA: Insufficient documentation

## 2021-11-07 DIAGNOSIS — Z95828 Presence of other vascular implants and grafts: Secondary | ICD-10-CM | POA: Insufficient documentation

## 2021-11-07 DIAGNOSIS — Z9889 Other specified postprocedural states: Secondary | ICD-10-CM | POA: Insufficient documentation

## 2021-11-07 DIAGNOSIS — I998 Other disorder of circulatory system: Secondary | ICD-10-CM | POA: Insufficient documentation

## 2021-11-08 DIAGNOSIS — I4891 Unspecified atrial fibrillation: Secondary | ICD-10-CM | POA: Insufficient documentation

## 2021-11-08 DIAGNOSIS — I951 Orthostatic hypotension: Secondary | ICD-10-CM | POA: Insufficient documentation

## 2021-11-15 DIAGNOSIS — Z95828 Presence of other vascular implants and grafts: Secondary | ICD-10-CM | POA: Diagnosis not present

## 2021-11-15 DIAGNOSIS — Z48812 Encounter for surgical aftercare following surgery on the circulatory system: Secondary | ICD-10-CM | POA: Diagnosis not present

## 2021-11-15 DIAGNOSIS — D62 Acute posthemorrhagic anemia: Secondary | ICD-10-CM | POA: Diagnosis not present

## 2021-11-15 DIAGNOSIS — Z7982 Long term (current) use of aspirin: Secondary | ICD-10-CM | POA: Diagnosis not present

## 2021-11-15 DIAGNOSIS — F17210 Nicotine dependence, cigarettes, uncomplicated: Secondary | ICD-10-CM | POA: Diagnosis not present

## 2021-11-15 DIAGNOSIS — Z9181 History of falling: Secondary | ICD-10-CM | POA: Diagnosis not present

## 2021-11-15 DIAGNOSIS — I341 Nonrheumatic mitral (valve) prolapse: Secondary | ICD-10-CM | POA: Diagnosis not present

## 2021-11-15 DIAGNOSIS — I480 Paroxysmal atrial fibrillation: Secondary | ICD-10-CM | POA: Diagnosis not present

## 2021-11-15 DIAGNOSIS — Z7901 Long term (current) use of anticoagulants: Secondary | ICD-10-CM | POA: Diagnosis not present

## 2021-11-15 DIAGNOSIS — I10 Essential (primary) hypertension: Secondary | ICD-10-CM | POA: Diagnosis not present

## 2021-11-15 DIAGNOSIS — F32A Depression, unspecified: Secondary | ICD-10-CM | POA: Diagnosis not present

## 2021-11-15 DIAGNOSIS — E039 Hypothyroidism, unspecified: Secondary | ICD-10-CM | POA: Diagnosis not present

## 2021-11-15 DIAGNOSIS — G8918 Other acute postprocedural pain: Secondary | ICD-10-CM | POA: Diagnosis not present

## 2021-11-15 DIAGNOSIS — I2510 Atherosclerotic heart disease of native coronary artery without angina pectoris: Secondary | ICD-10-CM | POA: Diagnosis not present

## 2021-11-18 DIAGNOSIS — I341 Nonrheumatic mitral (valve) prolapse: Secondary | ICD-10-CM | POA: Diagnosis not present

## 2021-11-18 DIAGNOSIS — I10 Essential (primary) hypertension: Secondary | ICD-10-CM | POA: Diagnosis not present

## 2021-11-18 DIAGNOSIS — I480 Paroxysmal atrial fibrillation: Secondary | ICD-10-CM | POA: Diagnosis not present

## 2021-11-18 DIAGNOSIS — D62 Acute posthemorrhagic anemia: Secondary | ICD-10-CM | POA: Diagnosis not present

## 2021-11-18 DIAGNOSIS — I2510 Atherosclerotic heart disease of native coronary artery without angina pectoris: Secondary | ICD-10-CM | POA: Diagnosis not present

## 2021-11-18 DIAGNOSIS — Z48812 Encounter for surgical aftercare following surgery on the circulatory system: Secondary | ICD-10-CM | POA: Diagnosis not present

## 2021-11-25 DIAGNOSIS — M5134 Other intervertebral disc degeneration, thoracic region: Secondary | ICD-10-CM | POA: Diagnosis not present

## 2021-11-25 DIAGNOSIS — R9431 Abnormal electrocardiogram [ECG] [EKG]: Secondary | ICD-10-CM | POA: Diagnosis not present

## 2021-11-25 DIAGNOSIS — I493 Ventricular premature depolarization: Secondary | ICD-10-CM | POA: Diagnosis not present

## 2021-11-25 DIAGNOSIS — I491 Atrial premature depolarization: Secondary | ICD-10-CM | POA: Diagnosis not present

## 2021-11-25 DIAGNOSIS — Z95828 Presence of other vascular implants and grafts: Secondary | ICD-10-CM | POA: Diagnosis not present

## 2021-11-25 DIAGNOSIS — I517 Cardiomegaly: Secondary | ICD-10-CM | POA: Diagnosis not present

## 2021-11-25 DIAGNOSIS — R918 Other nonspecific abnormal finding of lung field: Secondary | ICD-10-CM | POA: Diagnosis not present

## 2021-11-25 DIAGNOSIS — Z48812 Encounter for surgical aftercare following surgery on the circulatory system: Secondary | ICD-10-CM | POA: Diagnosis not present

## 2021-11-25 DIAGNOSIS — J90 Pleural effusion, not elsewhere classified: Secondary | ICD-10-CM | POA: Diagnosis not present

## 2021-11-25 DIAGNOSIS — Z9889 Other specified postprocedural states: Secondary | ICD-10-CM | POA: Diagnosis not present

## 2021-11-25 DIAGNOSIS — D539 Nutritional anemia, unspecified: Secondary | ICD-10-CM | POA: Diagnosis not present

## 2021-11-26 DIAGNOSIS — D62 Acute posthemorrhagic anemia: Secondary | ICD-10-CM | POA: Diagnosis not present

## 2021-11-26 DIAGNOSIS — I480 Paroxysmal atrial fibrillation: Secondary | ICD-10-CM | POA: Diagnosis not present

## 2021-11-26 DIAGNOSIS — I341 Nonrheumatic mitral (valve) prolapse: Secondary | ICD-10-CM | POA: Diagnosis not present

## 2021-11-26 DIAGNOSIS — Z48812 Encounter for surgical aftercare following surgery on the circulatory system: Secondary | ICD-10-CM | POA: Diagnosis not present

## 2021-11-26 DIAGNOSIS — I2510 Atherosclerotic heart disease of native coronary artery without angina pectoris: Secondary | ICD-10-CM | POA: Diagnosis not present

## 2021-11-26 DIAGNOSIS — I10 Essential (primary) hypertension: Secondary | ICD-10-CM | POA: Diagnosis not present

## 2021-11-28 NOTE — Progress Notes (Deleted)
Cardiology Office Note   Date:  11/28/2021   ID:  [name], DOB Dec 15, 1951, MRN [id number]  PCP:  Michael Boston, MD  Cardiologist:   Shacoria Latif [place], MD   No chief complaint on file.     History of Present Illness: Kristine Davidson is a 70 y.o. female who is seen for evaluation of atrial fibrillation and aortic atherosclerosis. She has a history of COPD, tobacco abuse, HTN, HLD, and MV prolapse.    Past Medical History:  Diagnosis Date   Allergy    seasonal allergies   Anemia    hx of    Anxiety    on meds   Aortic atherosclerosis (HCC)    Arthritis    generalized   B12 deficiency 03/31/2012   Depression    on meds--hx of   Diverticular disease    Emphysema of lung ([place])    HSV-2 (herpes simplex virus 2) infection    Hyperlipidemia    diet controlled   Hypertension    Mitral valve prolapse    Osteoporosis 01/14/2018   DEXA scan at [place] 01/14/18 T score -2.8 at Rt femur neck and L1-L4 AP spine   Substance abuse ([place])    Thoracic aortic ectasia (HCC)    Thyroid disease    on meds   Tobacco dependence    Vitamin D deficiency     Past Surgical History:  Procedure Laterality Date   COLONOSCOPY     D and C     TONSILLECTOMY     VARICOSE VEIN SURGERY  2000     Current Outpatient Medications  Medication Sig Dispense Refill   docusate sodium (COLACE) 100 MG capsule Take 1 capsule (100 mg total) by mouth 2 (two) times daily as needed for mild constipation. 10 capsule 0   esmolol (BREVIBLOC) 2000 mg / 100 mL infusion Inject 1.29-15.48 mg/min into the vein continuous. 100 mL    fentaNYL (SUBLIMAZE) 50 MCG/ML injection Inject 0.5-1 mLs (25-50 mcg total) into the vein every 2 (two) hours as needed for severe pain or moderate pain.  0   levothyroxine (SYNTHROID) 50 MCG tablet TAKE 1 TABLET (50 MCG TOTAL) BY MOUTH DAILY. (Patient taking differently: Take 50 mcg by mouth at bedtime.) 90 tablet 3   nitroGLYCERIN (NITROSTAT) 0.4 MG SL tablet Place 2 tablets  (0.8 mg total) under the tongue once for 1 dose.  12   polyethylene glycol (MIRALAX / GLYCOLAX) 17 g packet Take 17 g by mouth daily as needed for moderate constipation. 14 each 0   traZODone (DESYREL) 50 MG tablet Take 1-2 tablets (50-100 mg total) by mouth at bedtime. (Patient taking differently: Take 50 mg by mouth at bedtime.) 180 tablet 3   valACYclovir (VALTREX) 500 MG tablet Take 1 tablet (500 mg total) by mouth at bedtime.     No current facility-administered medications for this visit.    Allergies:   Codeine    Social History:  The patient  reports that she has been smoking cigarettes. She has a 21.50 pack-year smoking history. She has never used smokeless tobacco. She reports that she does not drink alcohol and does not use drugs.   Family History:  The patient's ***family history includes Breast cancer (age of onset: 65) in her mother; Diabetes in her sister; Macular degeneration in her mother; Parkinson's disease in her father and sister; Stroke in her mother.    ROS:  Please see the history of present illness.   Otherwise,  review of systems are positive for {NONE DEFAULTED:18576}.   All other systems are reviewed and negative.    PHYSICAL EXAM: VS:  There were no vitals taken for this visit. , BMI There is no height or weight on file to calculate BMI. GEN: Well nourished, well developed, in no acute distress HEENT: normal Neck: no JVD, carotid bruits, or masses Cardiac: ***RRR; no murmurs, rubs, or gallops,no edema  Respiratory:  clear to auscultation bilaterally, normal work of breathing GI: soft, nontender, nondistended, + BS MS: no deformity or atrophy Skin: warm and dry, no rash Neuro:  Strength and sensation are intact Psych: euthymic mood, full affect   EKG:  EKG {ACTION; IS/IS PYP:95093267} ordered today. The ekg ordered today demonstrates ***   Recent Labs: 11/04/2021: ALT 17 11/05/2021: BUN 9; Creatinine, Ser 0.51; Hemoglobin 7.9; Platelets 276; Potassium  3.8; Sodium 135    Lipid Panel    Component Value Date/Time   CHOL 96 11/05/2021 0610   CHOL 177 11/10/2017 1042   TRIG 61 11/05/2021 0610   HDL 42 11/05/2021 0610   HDL 79 11/10/2017 1042   CHOLHDL 2.3 11/05/2021 0610   VLDL 12 11/05/2021 0610   LDLCALC 42 11/05/2021 0610   LDLCALC 77 11/10/2017 1042      Wt Readings from Last 3 Encounters:  11/05/21 118 lb 6.2 oz (53.7 kg)  10/27/21 113 lb 12.8 oz (51.6 kg)  08/21/21 110 lb (49.9 kg)      Other studies Reviewed: Additional studies/ records that were reviewed today include: ***. Review of the above records demonstrates: ***   ASSESSMENT AND PLAN:  1.  ***   Current medicines are reviewed at length with the patient today.  The patient {ACTIONS; HAS/DOES NOT HAVE:19233} concerns regarding medicines.  The following changes have been made:  {PLAN; NO CHANGE:13088:s}  Labs/ tests ordered today include: *** No orders of the defined types were placed in this encounter.        Disposition:   FU with *** in {gen number 1-24:580998} {Days to years:10300}  Signed, Carver Murakami [place], MD  11/28/2021 9:21 AM    [facility] [address] [place], [place], [address] Phone [phone], Fax [phone]

## 2021-11-30 NOTE — Progress Notes (Signed)
Cardiology Office Note:    Date:  12/01/2021   ID:  Kristine Davidson, DOB 10-04-1951, MRN [id number]  PCP:  Michael Boston, MD   [facility] Providers Cardiologist:  Werner Lean, MD     Referring MD: Michael Boston, MD   CC: Follow up surgery  History of Present Illness:    Kristine Davidson is a 70 y.o. female with a hx of PAF; CAC and Aortic Atherosclerosis, MVP, and HLD.  Seen incidentally for the an aortic dissection.  S/p Surgery (at [facility]) complicated by R arm compartment syndrome s/p surgery and BMS to subclavian (R pox, R distal R axillary).  Patient notes that she is doing well after surgery.  Had great care at [facility]  No chest pain or pressure .  No SOB/DOE and no PND/Orthopnea.  No weight gain or leg swelling.  No palpitations or syncope.  Makes progress every day.  Took a shower standing (half way) today).  Has right arm pain and sleep issues.   Normal has trazodone but still not back to normal yet.  Stopped smoking after we had discussed her diagnosis.   Past Medical History:  Diagnosis Date   Allergy    seasonal allergies   Anemia    hx of    Anxiety    on meds   Aortic atherosclerosis (HCC)    Arthritis    generalized   B12 deficiency 03/31/2012   Depression    on meds--hx of   Diverticular disease    Emphysema of lung ([place])    HSV-2 (herpes simplex virus 2) infection    Hyperlipidemia    diet controlled   Hypertension    Mitral valve prolapse    Osteoporosis 01/14/2018   DEXA scan at [place] 01/14/18 T score -2.8 at Rt femur neck and L1-L4 AP spine   Substance abuse ([place])    Thoracic aortic ectasia (HCC)    Thyroid disease    on meds   Tobacco dependence    Vitamin D deficiency     Past Surgical History:  Procedure Laterality Date   COLONOSCOPY     D and C     TONSILLECTOMY     VARICOSE VEIN SURGERY  2000    Current Medications: Current Meds  Medication Sig   acetaminophen (TYLENOL) 325 MG tablet as needed for pain.    amiodarone (PACERONE) 200 MG tablet Take by mouth daily.   aspirin 81 MG EC tablet daily.   buPROPion (WELLBUTRIN XL) 150 MG 24 hr tablet Take by mouth daily.   ELIQUIS 2.5 MG TABS tablet Take 2.5 mg by mouth 2 (two) times daily.   famotidine (PEPCID) 10 MG tablet as needed.   levothyroxine (SYNTHROID) 50 MCG tablet TAKE 1 TABLET (50 MCG TOTAL) BY MOUTH DAILY.   metoprolol tartrate (LOPRESSOR) 25 MG tablet Take 25 mg by mouth daily.   PARoxetine (PAXIL) 30 MG tablet Take by mouth daily.   polyethylene glycol (MIRALAX / GLYCOLAX) 17 g packet at bedtime.   rosuvastatin (CRESTOR) 20 MG tablet Take by mouth daily.   Sennosides (SENOKOT PO) every morning.   traZODone (DESYREL) 50 MG tablet Take 1-2 tablets (50-100 mg total) by mouth at bedtime.   valACYclovir (VALTREX) 500 MG tablet Take 1 tablet (500 mg total) by mouth at bedtime.     Allergies:   Quinolones and Codeine   Social History   Socioeconomic History   Marital status: Divorced    Spouse name: Not on  file   Number of children: 0   Years of education: Not on file   Highest education level: Professional school degree (e.g., MD, DDS, DVM, JD)  Occupational History   Occupation: Teacher    Employer: [employer]    Comment: Retired  Tobacco Use   Smoking status: Every Day    Packs/day: 0.50    Years: 43.00    Pack years: 21.50    Types: Cigarettes   Smokeless tobacco: Never   Tobacco comments:    3 cigarettes daily 10/27/2021  Vaping Use   Vaping Use: Never used  Substance and Sexual Activity   Alcohol use: No    Alcohol/week: 0.0 standard drinks   Drug use: No   Sexual activity: Never  Other Topics Concern   Not on file  Social History Narrative   Lives alone.    Social Determinants of Health   Financial Resource Strain: Not on file  Food Insecurity: Not on file  Transportation Needs: Not on file  Physical Activity: Not on file  Stress: Not on file  Social Connections: Not on file     Comes with family  Family History: The patient's family history includes Breast cancer (age of onset: 34) in her mother; Diabetes in her sister; Macular degeneration in her mother; Parkinson's disease in her father and sister; Stroke in her mother. There is no history of Colon polyps, Colon cancer, Esophageal cancer, Stomach cancer, or Rectal cancer.  ROS:   Please see the history of present illness.     All other systems reviewed and are negative.  EKGs/Labs/Other Studies Reviewed:    The following studies were reviewed today:  EKG:    Transthoracic Echocardiogram: [id number] diagnosis of dissection Results: 1. The mitral valve is normal in structure. No evidence of mitral valve  regurgitation. No evidence of mitral stenosis.   2. Moderate pericardial effusion. The pericardial effusion is  circumferential.   3. The inferior vena cava is dilated in size with <50% respiratory  variability, suggesting right atrial pressure of 15 mmHg.   4. There is evidence of an aortic dissection with a dissection flap above  the S inus of Valsalva. This begins to extendin to the aortic arch.   5. Left ventricular ejection fraction, by estimation, is 60 to 65%. The  left ventricle has normal function. The left ventricle has no regional  wall motion abnormalities. There is mild concentric left ventricular  hypertrophy. Left ventricular diastolic  parameters are consistent with Grade II diastolic dysfunction  (pseudonormalization).   6. Right ventricular systolic function is normal. The right ventricular  size is normal. There is mildly elevated pulmonary artery systolic  pressure. The estimated right ventricular systolic pressure is 97.9 mmHg.   7. The aortic valve is tricuspid. Aortic valve regurgitation is mild. No  aortic stenosis is present.   Comparison(s): No prior Echocardiogram.   Conclusion(s)/Recommendation(s): Emergent finding: Patient sent to ED for  stat imaging. Discussed with  Patient, CT surgery, ED receiving team.    Cardiac CT: Date: 11/05/21 Results: 1. Mild CAD in proximal-mid RCA and mid RCA, CADRADS = 2.   2. Coronary calcium score is 193 , which places the patient in the 77th percentile for age and sex matched control.   3. Normal coronary origins with right dominance.   4. Ascending aortic aneurysm with ascending aorta Type A dissection, visualized previously on CTA aorta exam 11/04/21 and described in radiologist's dictation. Aorta measures 40 mm at sinus of Valsalva and  49 mm at mid ascending aorta   5.  Moderate circumferential pericardial effusion.   Recent Labs: 11/04/2021: ALT 17 11/05/2021: BUN 9; Creatinine, Ser 0.51; Hemoglobin 7.9; Platelets 276; Potassium 3.8; Sodium 135  Recent Lipid Panel    Component Value Date/Time   CHOL 96 11/05/2021 0610   CHOL 177 11/10/2017 1042   TRIG 61 11/05/2021 0610   HDL 42 11/05/2021 0610   HDL 79 11/10/2017 1042   CHOLHDL 2.3 11/05/2021 0610   VLDL 12 11/05/2021 0610   LDLCALC 42 11/05/2021 0610   LDLCALC 77 11/10/2017 1042     Risk Assessment/Calculations:    CHA2DS2-VASc Score = 3   This indicates a 3.2% annual risk of stroke. The patient's score is based upon: CHF History: 0 HTN History: 0 Diabetes History: 0 Stroke History: 0 Vascular Disease History: 1 (aortic atherosclerosis) Age Score: 1 Gender Score: 1          Physical Exam:    VS:  BP (!) 98/58   Pulse 63   Ht 5\' 6"  (1.676 m)   Wt 115 lb 6.4 oz (52.3 kg)   SpO2 97%   BMI 18.63 kg/m     Wt Readings from Last 3 Encounters:  12/01/21 115 lb 6.4 oz (52.3 kg)  11/05/21 118 lb 6.2 oz (53.7 kg)  10/27/21 113 lb 12.8 oz (51.6 kg)    Gen: no distress, thin female  Neck: No JVD, R subclavian bruit Cardiac: No Rubs or Gallops, no Murmur, normal rhythm +2 radial pulses Respiratory: Clear to auscultation bilaterally, normal effort, normal  respiratory rate GI: Soft, nontender, non-distended  MS: No  edema;  moves  all extremities Integument: Skin feels warm, has R arm scar from fasciotomy, and well heal scar from surgical interventions Neuro:  At time of evaluation, alert and oriented to person/place/time/situation  Psych: Normal affect, patient feels tired but well   ASSESSMENT:    1. Therapeutic drug monitoring   2. Dissection of ascending aorta   3. Encounter for monitoring amiodarone therapy   4. Paroxysmal atrial fibrillation (HCC)   5. Mitral valve prolapse    PLAN:    PAF Mild CAD and Aortic Atherosclerosis &HLD S/p subclavian and axillary PCI S/p Aortic Dissection MVP - will let amiodarone stop 12/30 and will place heart monitor in the new year (at next visit or if symptoms, LFTs and TFTs today - ASA and eliquis for 6 months to one year- will defer to vascular, then eliquis mono therapy - will get [facility], at next visit echo for resolution of pericardial effusion - is smoke free - continue crestor LDL goal < 55 - change quinolone allergy to increased severity - will need 1st family screening - if BP remains this low and patient is symptomatic, will do trial off amiodarone  Me or APP in 3-4 months        Medication Adjustments/Labs and Tests Ordered: Current medicines are reviewed at length with the patient today.  Concerns regarding medicines are outlined above.  Orders Placed This Encounter  Procedures   Hepatic function panel   TSH   No orders of the defined types were placed in this encounter.   Patient Instructions  Medication Instructions:  Your physician recommends that you continue on your current medications as directed. Please refer to the Current Medication list given to you today.  *If you need a refill on your cardiac medications before your next appointment, please call your pharmacy*   Lab Work:  TODAY: TSH and LFT If you have labs (blood work) drawn today and your tests are completely normal, you will receive your results only  by: [place] (if you have MyChart) OR A paper copy in the mail If you have any lab test that is abnormal or we need to change your treatment, we will call you to review the results.   Testing/Procedures: NONE   Follow-Up: At [facility], you and your health needs are our priority.  As part of our continuing mission to provide you with exceptional heart care, we have created designated Provider Care Teams.  These Care Teams include your primary Cardiologist (physician) and Advanced Practice Providers (APPs -  Physician Assistants and Nurse Practitioners) who all work together to provide you with the care you need, when you need it.   Your next appointment:   3 - 4 month(s)  The format for your next appointment:   In Person  Provider:   Werner Lean, MD       Signed, Werner Lean, MD  12/01/2021 4:00 PM    [place]

## 2021-12-01 ENCOUNTER — Other Ambulatory Visit: Payer: Self-pay

## 2021-12-01 ENCOUNTER — Encounter: Payer: Self-pay | Admitting: Internal Medicine

## 2021-12-01 ENCOUNTER — Ambulatory Visit: Payer: Medicare Other | Admitting: Cardiology

## 2021-12-01 ENCOUNTER — Ambulatory Visit (INDEPENDENT_AMBULATORY_CARE_PROVIDER_SITE_OTHER): Payer: Medicare Other | Admitting: Internal Medicine

## 2021-12-01 VITALS — BP 98/58 | HR 63 | Ht 66.0 in | Wt 115.4 lb

## 2021-12-01 DIAGNOSIS — I480 Paroxysmal atrial fibrillation: Secondary | ICD-10-CM | POA: Diagnosis not present

## 2021-12-01 DIAGNOSIS — I341 Nonrheumatic mitral (valve) prolapse: Secondary | ICD-10-CM | POA: Diagnosis not present

## 2021-12-01 DIAGNOSIS — Z79899 Other long term (current) drug therapy: Secondary | ICD-10-CM | POA: Diagnosis not present

## 2021-12-01 DIAGNOSIS — I7101 Dissection of ascending aorta: Secondary | ICD-10-CM | POA: Diagnosis not present

## 2021-12-01 DIAGNOSIS — Z5181 Encounter for therapeutic drug level monitoring: Secondary | ICD-10-CM

## 2021-12-01 NOTE — Patient Instructions (Signed)
Medication Instructions:  Your physician recommends that you continue on your current medications as directed. Please refer to the Current Medication list given to you today.  *If you need a refill on your cardiac medications before your next appointment, please call your pharmacy*   Lab Work: TODAY: TSH and LFT If you have labs (blood work) drawn today and your tests are completely normal, you will receive your results only by: [place] (if you have MyChart) OR A paper copy in the mail If you have any lab test that is abnormal or we need to change your treatment, we will call you to review the results.   Testing/Procedures: NONE   Follow-Up: At [facility], you and your health needs are our priority.  As part of our continuing mission to provide you with exceptional heart care, we have created designated Provider Care Teams.  These Care Teams include your primary Cardiologist (physician) and Advanced Practice Providers (APPs -  Physician Assistants and Nurse Practitioners) who all work together to provide you with the care you need, when you need it.   Your next appointment:   3 - 4 month(s)  The format for your next appointment:   In Person  Provider:   Werner Lean, MD

## 2021-12-02 ENCOUNTER — Ambulatory Visit (INDEPENDENT_AMBULATORY_CARE_PROVIDER_SITE_OTHER): Payer: Medicare Other

## 2021-12-02 ENCOUNTER — Telehealth: Payer: Self-pay

## 2021-12-02 DIAGNOSIS — I700 Atherosclerosis of aorta: Secondary | ICD-10-CM | POA: Diagnosis not present

## 2021-12-02 DIAGNOSIS — G479 Sleep disorder, unspecified: Secondary | ICD-10-CM | POA: Diagnosis not present

## 2021-12-02 DIAGNOSIS — I480 Paroxysmal atrial fibrillation: Secondary | ICD-10-CM

## 2021-12-02 DIAGNOSIS — I7101 Dissection of ascending aorta: Secondary | ICD-10-CM | POA: Diagnosis not present

## 2021-12-02 DIAGNOSIS — D649 Anemia, unspecified: Secondary | ICD-10-CM | POA: Diagnosis not present

## 2021-12-02 DIAGNOSIS — Z720 Tobacco use: Secondary | ICD-10-CM | POA: Diagnosis not present

## 2021-12-02 DIAGNOSIS — F3341 Major depressive disorder, recurrent, in partial remission: Secondary | ICD-10-CM | POA: Diagnosis not present

## 2021-12-02 DIAGNOSIS — E039 Hypothyroidism, unspecified: Secondary | ICD-10-CM | POA: Diagnosis not present

## 2021-12-02 DIAGNOSIS — B009 Herpesviral infection, unspecified: Secondary | ICD-10-CM | POA: Diagnosis not present

## 2021-12-02 DIAGNOSIS — T79A11A Traumatic compartment syndrome of right upper extremity, initial encounter: Secondary | ICD-10-CM | POA: Diagnosis not present

## 2021-12-02 LAB — HEPATIC FUNCTION PANEL
ALT: 31 IU/L (ref 0–32)
AST: 22 IU/L (ref 0–40)
Albumin: 3.9 g/dL (ref 3.7–4.7)
Alkaline Phosphatase: 120 IU/L (ref 44–121)
Bilirubin Total: 0.3 mg/dL (ref 0.0–1.2)
Bilirubin, Direct: 0.14 mg/dL (ref 0.00–0.40)
Total Protein: 6.4 g/dL (ref 6.0–8.5)

## 2021-12-02 LAB — TSH: TSH: 9.05 u[IU]/mL — ABNORMAL HIGH (ref 0.450–4.500)

## 2021-12-02 NOTE — Progress Notes (Unsigned)
Enrolled for [employer] to mail a 14 day ZIO XT long term holter monitor to the patients address on file.  Patient to wear the monitor in early January of 2023.

## 2021-12-02 NOTE — Telephone Encounter (Signed)
-----   Message from Werner Lean, MD sent at 12/02/2021  8:23 AM EST ----- Results: TSH elevated Plan: Stop amiodarone early Zio Patch Early January  Werner Lean, MD

## 2021-12-02 NOTE — Progress Notes (Signed)
Zio patch monitor instruction letter.

## 2021-12-02 NOTE — Telephone Encounter (Signed)
RN called pt reviewed results and MD recommendations.  Pt will stop taking amiodarone d/t elevated thyroid function.  RN reviewed instructions for heart monitor and instruction letter placed on my chart for pt to view.  Pt aware that heart monitor is to be worn early January 2023.  All questions answered pt verbalizes understanding.  Order placed.

## 2021-12-03 DIAGNOSIS — I2510 Atherosclerotic heart disease of native coronary artery without angina pectoris: Secondary | ICD-10-CM | POA: Diagnosis not present

## 2021-12-03 DIAGNOSIS — I480 Paroxysmal atrial fibrillation: Secondary | ICD-10-CM | POA: Diagnosis not present

## 2021-12-03 DIAGNOSIS — D62 Acute posthemorrhagic anemia: Secondary | ICD-10-CM | POA: Diagnosis not present

## 2021-12-03 DIAGNOSIS — I10 Essential (primary) hypertension: Secondary | ICD-10-CM | POA: Diagnosis not present

## 2021-12-03 DIAGNOSIS — Z48812 Encounter for surgical aftercare following surgery on the circulatory system: Secondary | ICD-10-CM | POA: Diagnosis not present

## 2021-12-03 DIAGNOSIS — I341 Nonrheumatic mitral (valve) prolapse: Secondary | ICD-10-CM | POA: Diagnosis not present

## 2021-12-09 DIAGNOSIS — I480 Paroxysmal atrial fibrillation: Secondary | ICD-10-CM | POA: Diagnosis not present

## 2021-12-09 DIAGNOSIS — D62 Acute posthemorrhagic anemia: Secondary | ICD-10-CM | POA: Diagnosis not present

## 2021-12-09 DIAGNOSIS — Z48812 Encounter for surgical aftercare following surgery on the circulatory system: Secondary | ICD-10-CM | POA: Diagnosis not present

## 2021-12-09 DIAGNOSIS — I10 Essential (primary) hypertension: Secondary | ICD-10-CM | POA: Diagnosis not present

## 2021-12-09 DIAGNOSIS — I2510 Atherosclerotic heart disease of native coronary artery without angina pectoris: Secondary | ICD-10-CM | POA: Diagnosis not present

## 2021-12-09 DIAGNOSIS — I341 Nonrheumatic mitral (valve) prolapse: Secondary | ICD-10-CM | POA: Diagnosis not present

## 2021-12-10 DIAGNOSIS — I480 Paroxysmal atrial fibrillation: Secondary | ICD-10-CM | POA: Diagnosis not present

## 2021-12-10 DIAGNOSIS — I341 Nonrheumatic mitral (valve) prolapse: Secondary | ICD-10-CM | POA: Diagnosis not present

## 2021-12-10 DIAGNOSIS — Z48812 Encounter for surgical aftercare following surgery on the circulatory system: Secondary | ICD-10-CM | POA: Diagnosis not present

## 2021-12-10 DIAGNOSIS — I2510 Atherosclerotic heart disease of native coronary artery without angina pectoris: Secondary | ICD-10-CM | POA: Diagnosis not present

## 2021-12-10 DIAGNOSIS — D62 Acute posthemorrhagic anemia: Secondary | ICD-10-CM | POA: Diagnosis not present

## 2021-12-10 DIAGNOSIS — I10 Essential (primary) hypertension: Secondary | ICD-10-CM | POA: Diagnosis not present

## 2021-12-15 DIAGNOSIS — D62 Acute posthemorrhagic anemia: Secondary | ICD-10-CM | POA: Diagnosis not present

## 2021-12-15 DIAGNOSIS — F32A Depression, unspecified: Secondary | ICD-10-CM | POA: Diagnosis not present

## 2021-12-15 DIAGNOSIS — I10 Essential (primary) hypertension: Secondary | ICD-10-CM | POA: Diagnosis not present

## 2021-12-15 DIAGNOSIS — I341 Nonrheumatic mitral (valve) prolapse: Secondary | ICD-10-CM | POA: Diagnosis not present

## 2021-12-15 DIAGNOSIS — F17210 Nicotine dependence, cigarettes, uncomplicated: Secondary | ICD-10-CM | POA: Diagnosis not present

## 2021-12-15 DIAGNOSIS — G8918 Other acute postprocedural pain: Secondary | ICD-10-CM | POA: Diagnosis not present

## 2021-12-15 DIAGNOSIS — Z95828 Presence of other vascular implants and grafts: Secondary | ICD-10-CM | POA: Diagnosis not present

## 2021-12-15 DIAGNOSIS — E039 Hypothyroidism, unspecified: Secondary | ICD-10-CM | POA: Diagnosis not present

## 2021-12-15 DIAGNOSIS — Z48812 Encounter for surgical aftercare following surgery on the circulatory system: Secondary | ICD-10-CM | POA: Diagnosis not present

## 2021-12-15 DIAGNOSIS — Z9181 History of falling: Secondary | ICD-10-CM | POA: Diagnosis not present

## 2021-12-15 DIAGNOSIS — I2510 Atherosclerotic heart disease of native coronary artery without angina pectoris: Secondary | ICD-10-CM | POA: Diagnosis not present

## 2021-12-15 DIAGNOSIS — Z7901 Long term (current) use of anticoagulants: Secondary | ICD-10-CM | POA: Diagnosis not present

## 2021-12-15 DIAGNOSIS — I480 Paroxysmal atrial fibrillation: Secondary | ICD-10-CM | POA: Diagnosis not present

## 2021-12-15 DIAGNOSIS — Z7982 Long term (current) use of aspirin: Secondary | ICD-10-CM | POA: Diagnosis not present

## 2021-12-23 DIAGNOSIS — I480 Paroxysmal atrial fibrillation: Secondary | ICD-10-CM | POA: Diagnosis not present

## 2021-12-23 DIAGNOSIS — Z48812 Encounter for surgical aftercare following surgery on the circulatory system: Secondary | ICD-10-CM | POA: Diagnosis not present

## 2021-12-23 DIAGNOSIS — I341 Nonrheumatic mitral (valve) prolapse: Secondary | ICD-10-CM | POA: Diagnosis not present

## 2021-12-23 DIAGNOSIS — D62 Acute posthemorrhagic anemia: Secondary | ICD-10-CM | POA: Diagnosis not present

## 2021-12-23 DIAGNOSIS — I2510 Atherosclerotic heart disease of native coronary artery without angina pectoris: Secondary | ICD-10-CM | POA: Diagnosis not present

## 2021-12-23 DIAGNOSIS — I10 Essential (primary) hypertension: Secondary | ICD-10-CM | POA: Diagnosis not present

## 2021-12-26 DIAGNOSIS — I2510 Atherosclerotic heart disease of native coronary artery without angina pectoris: Secondary | ICD-10-CM | POA: Diagnosis not present

## 2021-12-26 DIAGNOSIS — I480 Paroxysmal atrial fibrillation: Secondary | ICD-10-CM

## 2021-12-26 DIAGNOSIS — D62 Acute posthemorrhagic anemia: Secondary | ICD-10-CM | POA: Diagnosis not present

## 2021-12-26 DIAGNOSIS — I341 Nonrheumatic mitral (valve) prolapse: Secondary | ICD-10-CM | POA: Diagnosis not present

## 2021-12-26 DIAGNOSIS — I10 Essential (primary) hypertension: Secondary | ICD-10-CM | POA: Diagnosis not present

## 2021-12-26 DIAGNOSIS — Z48812 Encounter for surgical aftercare following surgery on the circulatory system: Secondary | ICD-10-CM | POA: Diagnosis not present

## 2021-12-29 DIAGNOSIS — I341 Nonrheumatic mitral (valve) prolapse: Secondary | ICD-10-CM | POA: Diagnosis not present

## 2021-12-29 DIAGNOSIS — D62 Acute posthemorrhagic anemia: Secondary | ICD-10-CM | POA: Diagnosis not present

## 2021-12-29 DIAGNOSIS — Z48812 Encounter for surgical aftercare following surgery on the circulatory system: Secondary | ICD-10-CM | POA: Diagnosis not present

## 2021-12-29 DIAGNOSIS — I2510 Atherosclerotic heart disease of native coronary artery without angina pectoris: Secondary | ICD-10-CM | POA: Diagnosis not present

## 2021-12-29 DIAGNOSIS — I10 Essential (primary) hypertension: Secondary | ICD-10-CM | POA: Diagnosis not present

## 2021-12-29 DIAGNOSIS — I480 Paroxysmal atrial fibrillation: Secondary | ICD-10-CM | POA: Diagnosis not present

## 2021-12-31 DIAGNOSIS — E039 Hypothyroidism, unspecified: Secondary | ICD-10-CM | POA: Diagnosis not present

## 2021-12-31 DIAGNOSIS — I700 Atherosclerosis of aorta: Secondary | ICD-10-CM | POA: Diagnosis not present

## 2022-01-07 DIAGNOSIS — Z48812 Encounter for surgical aftercare following surgery on the circulatory system: Secondary | ICD-10-CM | POA: Diagnosis not present

## 2022-01-07 DIAGNOSIS — I341 Nonrheumatic mitral (valve) prolapse: Secondary | ICD-10-CM | POA: Diagnosis not present

## 2022-01-07 DIAGNOSIS — I10 Essential (primary) hypertension: Secondary | ICD-10-CM | POA: Diagnosis not present

## 2022-01-07 DIAGNOSIS — I2510 Atherosclerotic heart disease of native coronary artery without angina pectoris: Secondary | ICD-10-CM | POA: Diagnosis not present

## 2022-01-07 DIAGNOSIS — I480 Paroxysmal atrial fibrillation: Secondary | ICD-10-CM | POA: Diagnosis not present

## 2022-01-07 DIAGNOSIS — D62 Acute posthemorrhagic anemia: Secondary | ICD-10-CM | POA: Diagnosis not present

## 2022-01-13 ENCOUNTER — Other Ambulatory Visit: Payer: Medicare Other

## 2022-01-16 DIAGNOSIS — I480 Paroxysmal atrial fibrillation: Secondary | ICD-10-CM | POA: Diagnosis not present

## 2022-01-28 DIAGNOSIS — I7779 Dissection of other artery: Secondary | ICD-10-CM | POA: Diagnosis not present

## 2022-01-28 DIAGNOSIS — I739 Peripheral vascular disease, unspecified: Secondary | ICD-10-CM | POA: Diagnosis not present

## 2022-02-06 ENCOUNTER — Encounter: Payer: Self-pay | Admitting: Internal Medicine

## 2022-02-23 ENCOUNTER — Encounter: Payer: Self-pay | Admitting: Internal Medicine

## 2022-02-25 DIAGNOSIS — Z720 Tobacco use: Secondary | ICD-10-CM | POA: Diagnosis not present

## 2022-02-25 DIAGNOSIS — I480 Paroxysmal atrial fibrillation: Secondary | ICD-10-CM | POA: Diagnosis not present

## 2022-02-25 DIAGNOSIS — M25512 Pain in left shoulder: Secondary | ICD-10-CM | POA: Diagnosis not present

## 2022-02-25 DIAGNOSIS — M25559 Pain in unspecified hip: Secondary | ICD-10-CM | POA: Diagnosis not present

## 2022-02-25 DIAGNOSIS — R5383 Other fatigue: Secondary | ICD-10-CM | POA: Diagnosis not present

## 2022-02-25 DIAGNOSIS — E039 Hypothyroidism, unspecified: Secondary | ICD-10-CM | POA: Diagnosis not present

## 2022-02-25 DIAGNOSIS — I7101 Dissection of ascending aorta: Secondary | ICD-10-CM | POA: Diagnosis not present

## 2022-02-25 DIAGNOSIS — D649 Anemia, unspecified: Secondary | ICD-10-CM | POA: Diagnosis not present

## 2022-02-25 DIAGNOSIS — M25511 Pain in right shoulder: Secondary | ICD-10-CM | POA: Diagnosis not present

## 2022-03-19 ENCOUNTER — Encounter: Payer: Self-pay | Admitting: Internal Medicine

## 2022-03-19 ENCOUNTER — Ambulatory Visit (INDEPENDENT_AMBULATORY_CARE_PROVIDER_SITE_OTHER): Payer: Medicare Other

## 2022-03-19 ENCOUNTER — Ambulatory Visit (INDEPENDENT_AMBULATORY_CARE_PROVIDER_SITE_OTHER): Payer: Medicare Other | Admitting: Internal Medicine

## 2022-03-19 VITALS — BP 103/62 | HR 56 | Ht 66.0 in | Wt 115.0 lb

## 2022-03-19 DIAGNOSIS — I3139 Other pericardial effusion (noninflammatory): Secondary | ICD-10-CM | POA: Insufficient documentation

## 2022-03-19 DIAGNOSIS — I341 Nonrheumatic mitral (valve) prolapse: Secondary | ICD-10-CM

## 2022-03-19 DIAGNOSIS — I7101 Dissection of ascending aorta: Secondary | ICD-10-CM

## 2022-03-19 DIAGNOSIS — I480 Paroxysmal atrial fibrillation: Secondary | ICD-10-CM

## 2022-03-19 NOTE — Progress Notes (Signed)
?Cardiology Office Note:   ? ?Date:  03/19/2022  ? ?ID:  Kristine Davidson, DOB [birth year], MRN [id number] ? ?PCP:  Michael Boston, MD ?  ?[place] [facility] Providers ?Cardiologist:  Werner Lean, MD    ? ?Referring MD: Michael Boston, MD  ? ?CC: Follow up surgery ? ?History of Present Illness:   ? ?Kristine Davidson is a 71 y.o. female with a hx of PAF; CAC and Aortic Atherosclerosis, MVP, and HLD.  Seen incidentally for the an aortic dissection.  S/p Surgery (at [facility]) complicated by R arm compartment syndrome s/p surgery and BMS to subclavian (R pox, R distal R axillary).   ?Since stopping amiodarone has called in with palpitations and increase fatigue. ? ?Patient notes that she is doing ok.   ?Nothing hurts. ?Has a small pinch in her chest last had occurred for years. ?Still has some numbness her her tingling and her hand from the fasciotomy.  ?She has called in a few times with palpitations, and after having going back on [employer], has some concerns. ?There are no interval hospital/ED visit.   ? ?None of her symptoms are debilitating but the she needs more information about her symptoms to know she is safe.  She describes PTSD from the trauma other event. ?She is smoke free: she walks her dog and uses a straw for mechanical triggers. ? ?Has chest heaviness that was new post surgery.  No SOB/DOE and no PND/Orthopnea.  ? ? ?Past Medical History:  ?Diagnosis Date  ? Allergy   ? seasonal allergies  ? Anemia   ? hx of   ? Anxiety   ? on meds  ? Aortic atherosclerosis ([place])   ? Arthritis   ? generalized  ? B12 deficiency 03/31/2012  ? Depression   ? on meds--hx of  ? Diverticular disease   ? Emphysema of lung ([place])   ? HSV-2 (herpes simplex virus 2) infection   ? Hyperlipidemia   ? diet controlled  ? Hypertension   ? Mitral valve prolapse   ? Osteoporosis 01/14/2018  ? DEXA scan at [facility] 01/14/18 T score -2.8 at Rt femur neck and L1-L4 AP spine  ? Substance abuse ([place])   ? Thoracic aortic ectasia (HCC)   ? Thyroid  disease   ? on meds  ? Tobacco dependence   ? Vitamin D deficiency   ? ? ?Past Surgical History:  ?Procedure Laterality Date  ? COLONOSCOPY    ? D and C    ? TONSILLECTOMY    ? VARICOSE VEIN SURGERY  2000  ? ? ?Current Medications: ?Current Meds  ?Medication Sig  ? acetaminophen (TYLENOL) 325 MG tablet as needed for pain.  ? aspirin 81 MG EC tablet daily.  ? buPROPion (WELLBUTRIN XL) 150 MG 24 hr tablet Take by mouth daily.  ? ELIQUIS 2.5 MG TABS tablet Take 2.5 mg by mouth 2 (two) times daily.  ? levothyroxine (SYNTHROID) 50 MCG tablet TAKE 1 TABLET (50 MCG TOTAL) BY MOUTH DAILY.  ? metoprolol tartrate (LOPRESSOR) 25 MG tablet Take 25 mg by mouth daily.  ? PARoxetine (PAXIL) 30 MG tablet Take by mouth daily.  ? polyethylene glycol (MIRALAX / GLYCOLAX) 17 g packet at bedtime.  ? Sennosides (SENOKOT PO) every morning.  ? traZODone (DESYREL) 50 MG tablet Take 1-2 tablets (50-100 mg total) by mouth at bedtime.  ? valACYclovir (VALTREX) 500 MG tablet Take 1 tablet (500 mg total) by mouth at bedtime.  ? [DISCONTINUED] rosuvastatin (CRESTOR)  20 MG tablet Take by mouth daily.  ?  ? ?Allergies:   Quinolones, Codeine, and Nickel  ? ?Social History  ? ?Socioeconomic History  ? Marital status: Divorced  ?  Spouse name: Not on file  ? Number of children: 0  ? Years of education: Not on file  ? Highest education level: Professional school degree (e.g., MD, DDS, DVM, JD)  ?Occupational History  ? Occupation: Pharmacist, hospital  ?  Employer: [employer]  ?  Comment: Retired  ?Tobacco Use  ? Smoking status: Every Day  ?  Packs/day: 0.50  ?  Years: 43.00  ?  Pack years: 21.50  ?  Types: Cigarettes  ? Smokeless tobacco: Never  ? Tobacco comments:  ?  3 cigarettes daily 10/27/2021  ?Vaping Use  ? Vaping Use: Never used  ?Substance and Sexual Activity  ? Alcohol use: No  ?  Alcohol/week: 0.0 standard drinks  ? Drug use: No  ? Sexual activity: Never  ?Other Topics Concern  ? Not on file  ?Social History Narrative  ? Lives  alone.   ? ?Social Determinants of Health  ? ?Financial Resource Strain: Not on file  ?Food Insecurity: Not on file  ?Transportation Needs: Not on file  ?Physical Activity: Not on file  ?Stress: Not on file  ?Social Connections: Not on file  ?  ?Social: Comes with family that sometimes come, I met her in the echo lab, has a dog ? ?Family History: ?The patient's family history includes Breast cancer (age of onset: 73) in her mother; Diabetes in her sister; Macular degeneration in her mother; Parkinson's disease in her father and sister; Stroke in her mother. There is no history of Colon polyps, Colon cancer, Esophageal cancer, Stomach cancer, or Rectal cancer. ? ?ROS:   ?Please see the history of present illness.    ? All other systems reviewed and are negative. ? ?EKGs/Labs/Other Studies Reviewed:   ? ?The following studies were reviewed today: ? ?EKG:   ?03/19/22:  Sinus bradycardia 56 ? ?Transthoracic Echocardiogram: ?[id number]- diagnosis of dissection ?Results: ?1. The mitral valve is normal in structure. No evidence of mitral valve  ?regurgitation. No evidence of mitral stenosis.  ? 2. Moderate pericardial effusion. The pericardial effusion is  ?circumferential.  ? 3. The inferior vena cava is dilated in size with <50% respiratory  ?variability, suggesting right atrial pressure of 15 mmHg.  ? 4. There is evidence of an aortic dissection with a dissection flap above  ?the S inus of Valsalva. This begins to extendin to the aortic arch.  ? 5. Left ventricular ejection fraction, by estimation, is 60 to 65%. The  ?left ventricle has normal function. The left ventricle has no regional  ?wall motion abnormalities. There is mild concentric left ventricular  ?hypertrophy. Left ventricular diastolic  ?parameters are consistent with Grade II diastolic dysfunction  ?(pseudonormalization).  ? 6. Right ventricular systolic function is normal. The right ventricular  ?size is normal. There is mildly elevated pulmonary artery  systolic  ?pressure. The estimated right ventricular systolic pressure is 88.5 mmHg.  ? 7. The aortic valve is tricuspid. Aortic valve regurgitation is mild. No  ?aortic stenosis is present.  ? ?Comparison(s): No prior Echocardiogram.  ? ?Conclusion(s)/Recommendation(s): Emergent finding: Patient sent to ED for  ?stat imaging. Discussed with Patient, CT surgery, ED receiving team.  ? ? ?Cardiac CT: ?Date: 11/05/21 ?Results: ?1. Mild CAD in proximal-mid RCA and mid RCA, CADRADS = 2. ?  ?2. Coronary calcium score is 193 ,  which places the patient in the ?77th percentile for age and sex matched control. ?  ?3. Normal coronary origins with right dominance. ?  ?4. Ascending aortic aneurysm with ascending aorta Type A dissection, ?visualized previously on CTA aorta exam 11/04/21 and described in ?radiologist's dictation. Aorta measures 40 mm at sinus of Valsalva ?and 49 mm at mid ascending aorta ?  ?5.  Moderate circumferential pericardial effusion. ? ? ?Recent Labs: ?11/05/2021: BUN 9; Creatinine, Ser 0.51; Hemoglobin 7.9; Platelets 276; Potassium 3.8; Sodium 135 ?12/01/2021: ALT 31; TSH 9.050  ?Recent Lipid Panel ?   ?Component Value Date/Time  ? CHOL 96 11/05/2021 0610  ? CHOL 177 11/10/2017 1042  ? TRIG 61 11/05/2021 0610  ? HDL 42 11/05/2021 0610  ? HDL 79 11/10/2017 1042  ? CHOLHDL 2.3 11/05/2021 0610  ? VLDL 12 11/05/2021 0610  ? [place] 42 11/05/2021 0610  ? [place] 77 11/10/2017 1042  ? ? ?Risk Assessment/Calculations:   ? ?CHA2DS2-VASc Score = 3  ? This indicates a 3.2% annual risk of stroke. ?The patient's score is based upon: ?CHF History: 0 ?HTN History: 0 ?Diabetes History: 0 ?Stroke History: 0 ?Vascular Disease History: 1 (aortic atherosclerosis) ?Age Score: 1 ?Gender Score: 1 ? ?  ? ? ?    ? ?Physical Exam:   ? ?VS:  BP 103/62   Pulse (!) 56   Ht '5\' 6"'  (1.676 m)   Wt 115 lb (52.2 kg)   SpO2 98%   BMI 18.56 kg/m?    ? ?Wt Readings from Last 3 Encounters:  ?03/19/22 115 lb (52.2 kg)  ?12/01/21 115 lb  6.4 oz (52.3 kg)  ?11/05/21 118 lb 6.2 oz (53.7 kg)  ?  ?Gen: no distress, thin female  ?Neck: No JVD, R subclavian bruit ?Cardiac: No Rubs or Gallops, no Murmur, normal rhythm +2 radial pulses ?Respiratory: Cle

## 2022-03-19 NOTE — Progress Notes (Unsigned)
Enrolled for [employer] to mail a ZIO XT long term holter monitor to the patients address on file.  

## 2022-03-19 NOTE — Patient Instructions (Signed)
Medication Instructions:  ?Your physician recommends that you continue on your current medications as directed. Please refer to the Current Medication list given to you today. ? ?*If you need a refill on your cardiac medications before your next appointment, please call your pharmacy* ? ? ?Lab Work: ?NONE ?If you have labs (blood work) drawn today and your tests are completely normal, you will receive your results only by: ?MyChart Message (if you have MyChart) OR ?A paper copy in the mail ?If you have any lab test that is abnormal or we need to change your treatment, we will call you to review the results. ? ? ?Testing/Procedures: ?Your physician has requested that you have an echocardiogram. Echocardiography is a painless test that uses sound waves to create images of your heart. It provides your doctor with information about the size and shape of your heart and how well your heart?s chambers and valves are working. This procedure takes approximately one hour. There are no restrictions for this procedure. ? ?Your physician has requested that you wear a heart monitor.   ? ? ?Follow-Up: ?At [facility], you and your health needs are our priority.  As part of our continuing mission to provide you with exceptional heart care, we have created designated Provider Care Teams.  These Care Teams include your primary Cardiologist (physician) and Advanced Practice Providers (APPs -  Physician Assistants and Nurse Practitioners) who all work together to provide you with the care you need, when you need it. ? ?We recommend signing up for the patient portal called "MyChart".  Sign up information is provided on this After Visit Summary.  MyChart is used to connect with patients for Virtual Visits (Telemedicine).  Patients are able to view lab/test results, encounter notes, upcoming appointments, etc.  Non-urgent messages can be sent to your provider as well.   ?To learn more about what you can do with MyChart, go to  [email or web address].   ? ?Your next appointment:   ?4 -5 month(s) ? ?The format for your next appointment:   ?In Person ? ?Provider:   ?Werner Lean, MD   ? ? ?Other Instructions ?ZIO XT- Long Term Monitor Instructions ? ?Your physician has requested you wear a ZIO patch monitor for 14 days.  ?This is a single patch monitor. [employer] supplies one patch monitor per enrollment. Additional ?stickers are not available. Please do not apply patch if you will be having a Nuclear Stress Test,  ?Echocardiogram, Cardiac CT, MRI, or Chest Xray during the period you would be wearing the  ?monitor. The patch cannot be worn during these tests. You cannot remove and re-apply the  ?ZIO XT patch monitor.  ?Your ZIO patch monitor will be mailed 3 day USPS to your address on file. It may take 3-5 days  ?to receive your monitor after you have been enrolled.  ?Once you have received your monitor, please review the enclosed instructions. Your monitor  ?has already been registered assigning a specific monitor serial # to you. ? ?Billing and Patient Assistance Program Information ? ?We have supplied [employer] with any of your insurance information on file for billing purposes. ?[employer] offers a sliding scale Patient Assistance Program for patients that do not have  ?insurance, or whose insurance does not completely cover the cost of the ZIO monitor.  ?You must apply for the Patient Assistance Program to qualify for this discounted rate.  ?To apply, please call [employer] at [phone], select option 4, select option 2, ask to apply for  ?  Patient Assistance Program. Theodore Demark will ask your household income, and how many people  ?are in your household. They will quote your out-of-pocket cost based on that information.  ?[employer] will also be able to set up a 9-month interest-free payment plan if needed. ? ?Applying the monitor ?  ?Shave hair from upper left chest.  ?Hold abrader disc by orange tab. Rub abrader in 40 strokes over the  upper left chest as  ?indicated in your monitor instructions.  ?Clean area with 4 enclosed alcohol pads. Let dry.  ?Apply patch as indicated in monitor instructions. Patch will be placed under collarbone on left  ?side of chest with arrow pointing upward.  ?Rub patch adhesive wings for 2 minutes. Remove white label marked "1". Remove the white  ?label marked "2". Rub patch adhesive wings for 2 additional minutes.  ?While looking in a mirror, press and release button in center of patch. A small green light will  ?flash 3-4 times. This will be your only indicator that the monitor has been turned on.  ?Do not shower for the first 24 hours. You may shower after the first 24 hours.  ?Press the button if you feel a symptom. You will hear a small click. Record Date, Time and  ?Symptom in the Patient Logbook.  ?When you are ready to remove the patch, follow instructions on the last 2 pages of Patient  ?Logbook. Stick patch monitor onto the last page of Patient Logbook.  ?Place Patient Logbook in the blue and white box. Use locking tab on box and tape box closed  ?securely. The blue and white box has prepaid postage on it. Please place it in the mailbox as  ?soon as possible. Your physician should have your test results approximately 7 days after the  ?monitor has been mailed back to [facility]  ?Call [facility] [place]at [phone] you have questions regarding  ?your ZIO XT patch monitor. Call them immediately if you see an orange light blinking on your  ?monitor.  ?If your monitor falls off in less than 4 days, contact our Monitor department at [phone]  ?If your monitor becomes loose or falls off after 4 days call [employer] at [phone]  ?suggestions on securing your monitor   ?

## 2022-03-22 DIAGNOSIS — I480 Paroxysmal atrial fibrillation: Secondary | ICD-10-CM | POA: Diagnosis not present

## 2022-03-24 ENCOUNTER — Encounter: Payer: Self-pay | Admitting: Internal Medicine

## 2022-04-06 ENCOUNTER — Encounter: Payer: Self-pay | Admitting: Internal Medicine

## 2022-04-10 ENCOUNTER — Ambulatory Visit (HOSPITAL_COMMUNITY): Payer: Medicare Other | Attending: Internal Medicine

## 2022-04-10 DIAGNOSIS — I480 Paroxysmal atrial fibrillation: Secondary | ICD-10-CM | POA: Insufficient documentation

## 2022-04-10 DIAGNOSIS — E039 Hypothyroidism, unspecified: Secondary | ICD-10-CM | POA: Diagnosis not present

## 2022-04-10 DIAGNOSIS — I700 Atherosclerosis of aorta: Secondary | ICD-10-CM | POA: Diagnosis not present

## 2022-04-10 DIAGNOSIS — I3139 Other pericardial effusion (noninflammatory): Secondary | ICD-10-CM | POA: Diagnosis not present

## 2022-04-10 DIAGNOSIS — E559 Vitamin D deficiency, unspecified: Secondary | ICD-10-CM | POA: Diagnosis not present

## 2022-04-10 LAB — ECHOCARDIOGRAM COMPLETE
Area-P 1/2: 2.63 cm2
S' Lateral: 2.85 cm

## 2022-04-12 ENCOUNTER — Encounter: Payer: Self-pay | Admitting: Internal Medicine

## 2022-04-13 DIAGNOSIS — I480 Paroxysmal atrial fibrillation: Secondary | ICD-10-CM | POA: Diagnosis not present

## 2022-04-19 ENCOUNTER — Other Ambulatory Visit: Payer: Self-pay

## 2022-04-19 ENCOUNTER — Emergency Department (HOSPITAL_COMMUNITY): Payer: Medicare Other

## 2022-04-19 ENCOUNTER — Encounter (HOSPITAL_COMMUNITY): Payer: Self-pay | Admitting: Emergency Medicine

## 2022-04-19 ENCOUNTER — Emergency Department (HOSPITAL_COMMUNITY)
Admission: EM | Admit: 2022-04-19 | Discharge: 2022-04-19 | Disposition: A | Discharge status: Home / Self Care | Payer: Medicare Other | Attending: Emergency Medicine | Admitting: Emergency Medicine

## 2022-04-19 DIAGNOSIS — Z20822 Contact with and (suspected) exposure to covid-19: Secondary | ICD-10-CM | POA: Diagnosis not present

## 2022-04-19 DIAGNOSIS — R059 Cough, unspecified: Secondary | ICD-10-CM | POA: Diagnosis not present

## 2022-04-19 DIAGNOSIS — Z7982 Long term (current) use of aspirin: Secondary | ICD-10-CM | POA: Diagnosis not present

## 2022-04-19 DIAGNOSIS — R519 Headache, unspecified: Secondary | ICD-10-CM | POA: Diagnosis not present

## 2022-04-19 DIAGNOSIS — Z7901 Long term (current) use of anticoagulants: Secondary | ICD-10-CM | POA: Insufficient documentation

## 2022-04-19 DIAGNOSIS — R509 Fever, unspecified: Secondary | ICD-10-CM

## 2022-04-19 DIAGNOSIS — R001 Bradycardia, unspecified: Secondary | ICD-10-CM | POA: Insufficient documentation

## 2022-04-19 DIAGNOSIS — B349 Viral infection, unspecified: Secondary | ICD-10-CM | POA: Diagnosis not present

## 2022-04-19 LAB — LACTIC ACID, PLASMA: Lactic Acid, Venous: 0.9 mmol/L (ref 0.5–1.9)

## 2022-04-19 LAB — RESP PANEL BY RT-PCR (FLU A&B, COVID) ARPGX2
Influenza A by PCR: NEGATIVE
Influenza B by PCR: NEGATIVE
SARS Coronavirus 2 by RT PCR: NEGATIVE

## 2022-04-19 LAB — URINALYSIS, ROUTINE W REFLEX MICROSCOPIC
Bilirubin Urine: NEGATIVE
Glucose, UA: NEGATIVE mg/dL
Hgb urine dipstick: NEGATIVE
Ketones, ur: NEGATIVE mg/dL
Leukocytes,Ua: NEGATIVE
Nitrite: NEGATIVE
Protein, ur: NEGATIVE mg/dL
Specific Gravity, Urine: 1.005 (ref 1.005–1.030)
pH: 6 (ref 5.0–8.0)

## 2022-04-19 LAB — BASIC METABOLIC PANEL
Anion gap: 9 (ref 5–15)
BUN: 9 mg/dL (ref 8–23)
CO2: 26 mmol/L (ref 22–32)
Calcium: 8.7 mg/dL — ABNORMAL LOW (ref 8.9–10.3)
Chloride: 98 mmol/L (ref 98–111)
Creatinine, Ser: 0.66 mg/dL (ref 0.44–1.00)
GFR, Estimated: >60 mL/min (ref >60)
Glucose, Bld: 106 mg/dL — ABNORMAL HIGH (ref 70–99)
Potassium: 3.9 mmol/L (ref 3.5–5.1)
Sodium: 133 mmol/L — ABNORMAL LOW (ref 135–145)

## 2022-04-19 LAB — CBC
HCT: 29.1 % — ABNORMAL LOW (ref 36.0–46.0)
Hemoglobin: 9.7 g/dL — ABNORMAL LOW (ref 12.0–15.0)
MCH: 29.8 pg (ref 26.0–34.0)
MCHC: 33.3 g/dL (ref 30.0–36.0)
MCV: 89.5 fL (ref 80.0–100.0)
Platelets: 194 10*3/uL (ref 150–400)
RBC: 3.25 MIL/uL — ABNORMAL LOW (ref 3.87–5.11)
RDW: 14.4 % (ref 11.5–15.5)
WBC: 5.5 10*3/uL (ref 4.0–10.5)
nRBC: 0 % (ref 0.0–0.2)

## 2022-04-19 MED ORDER — SODIUM CHLORIDE 0.9% FLUSH: 3.0000 mL | Freq: Once | INTRAVENOUS | Status: DC

## 2022-04-19 NOTE — ED Provider Notes (Signed)
?[facility] [place] ?Provider Note ? ? ?CSN: [id number] ?Arrival date & time: 04/19/22  1032 ? ?  ? ?History ? ?Chief Complaint  ?Patient presents with  ? Fever  ? Headache  ? ? ?Kristine Davidson is a 71 y.o. female.  Presenting to the emergency department due to concern for fever, headache.  Reports that symptoms have been ongoing for a couple days.  Fever up to 101.8 at home.  Headache has been easily controlled with Tylenol.  Currently does not have headache.  Also has had some nasal congestion but has had the nasal congestion for a while.  No significant cough. ? ?Reviewed chart, reviewed last cardiology note patient has notable past medical history of  - PAF; CAC and Aortic Atherosclerosis, MVP, and HLD. Aortic dissection.  S/p Surgery (at [facility]) complicated by R arm compartment syndrome s/p surgery and BMS to subclavian ? ?HPI ? ?  ? ?Home Medications ?Prior to Admission medications   ?Medication Sig Start Date End Date Taking? Authorizing Provider  ?acetaminophen (TYLENOL) 325 MG tablet as needed for pain.    [provider]  ?aspirin 81 MG EC tablet daily. 11/13/21   [provider]  ?buPROPion (WELLBUTRIN XL) 150 MG 24 hr tablet Take by mouth daily.    [provider]  ?ELIQUIS 2.5 MG TABS tablet Take 2.5 mg by mouth 2 (two) times daily. 11/13/21   [provider]  ?levothyroxine (SYNTHROID) 50 MCG tablet TAKE 1 TABLET (50 MCG TOTAL) BY MOUTH DAILY. 12/07/19   Wendie Agreste, MD  ?metoprolol tartrate (LOPRESSOR) 25 MG tablet Take 25 mg by mouth daily. 11/13/21   [provider]  ?PARoxetine (PAXIL) 30 MG tablet Take by mouth daily.    [provider]  ?polyethylene glycol (MIRALAX / GLYCOLAX) 17 g packet at bedtime.    [provider]  ?Sennosides (SENOKOT PO) every morning.    [provider]  ?traZODone (DESYREL) 50 MG tablet Take 1-2 tablets (50-100 mg total) by mouth at bedtime. 12/07/19   Wendie Agreste, MD  ?valACYclovir (VALTREX) 500 MG tablet Take 1 tablet (500 mg total) by mouth at bedtime. 11/05/21   Erick Colace, NP  ?   ? ?Allergies    ?Quinolones, Codeine, and Nickel   ? ?Review of Systems   ?Review of Systems  ?Constitutional:  Positive for fever. Negative for chills and fatigue.  ?HENT:  Positive for congestion and sinus pressure. Negative for ear pain, sinus pain and sore throat.   ?Eyes:  Negative for pain and visual disturbance.  ?Respiratory:  Negative for cough and shortness of breath.   ?Cardiovascular:  Negative for chest pain and palpitations.  ?Gastrointestinal:  Negative for abdominal pain and vomiting.  ?Genitourinary:  Negative for dysuria and hematuria.  ?Musculoskeletal:  Negative for arthralgias and back pain.  ?Skin:  Negative for color change and rash.  ?Neurological:  Positive for headaches. Negative for seizures and syncope.  ?All other systems reviewed and are negative. ? ?Physical Exam ?Updated Vital Signs ?BP 113/61   Pulse 64   Temp 98.8 ?F (37.1 ?C) (Oral)   Resp 20   Ht '5\' 6"'$  (1.676 m)   Wt 52.5 kg   SpO2 90%   BMI 18.68 kg/m?  ?Physical Exam ?Vitals and nursing note reviewed.  ?Constitutional:   ?   General: She is not in acute distress. ?   Appearance: She is well-developed.  ?HENT:  ?   Head: Normocephalic and  atraumatic.  ?Eyes:  ?   Conjunctiva/sclera: Conjunctivae normal.  ?Cardiovascular:  ?   Rate and Rhythm: Normal rate and regular rhythm.  ?   Heart sounds: No murmur heard. ?Pulmonary:  ?   Effort: Pulmonary effort is normal. No respiratory distress.  ?   Breath sounds: Normal breath sounds.  ?Abdominal:  ?   Palpations: Abdomen is soft.  ?   Tenderness: There is no abdominal tenderness.  ?Musculoskeletal:     ?   General: No swelling.  ?   Cervical back: Neck supple.  ?Skin: ?   General: Skin is warm and dry.  ?   Capillary Refill: Capillary refill takes less than 2 seconds.  ?Neurological:  ?   Mental Status: She is alert.  ?Psychiatric:     ?    Mood and Affect: Mood normal.  ? ? ?ED Results / Procedures / Treatments   ?Labs ?(all labs ordered are listed, but only abnormal results are displayed) ?Labs Reviewed  ?BASIC METABOLIC PANEL - Abnormal; Notable for the following components:  ?    Result Value  ? Sodium 133 (*)   ? Glucose, Bld 106 (*)   ? Calcium 8.7 (*)   ? All other components within normal limits  ?CBC - Abnormal; Notable for the following components:  ? RBC 3.25 (*)   ? Hemoglobin 9.7 (*)   ? HCT 29.1 (*)   ? All other components within normal limits  ?RESP PANEL BY RT-PCR (FLU A&B, COVID) [id number]  ?CULTURE, BLOOD (ROUTINE X 2)  ?CULTURE, BLOOD (ROUTINE X 2)  ?URINALYSIS, ROUTINE W REFLEX MICROSCOPIC  ?LACTIC ACID, PLASMA  ? ? ?EKG ?None ? ?Radiology ?DG Chest Portable 1 View ? ?Result Date: 04/19/2022 ?CLINICAL DATA:  Fever headache and cough. EXAM: PORTABLE CHEST 1 VIEW COMPARISON:  12/04/2013 FINDINGS: 1244 hours. Lungs are hyperexpanded. The lungs are clear without focal pneumonia, edema, pneumothorax or pleural effusion. The cardio pericardial silhouette is enlarged. Vascular stent device is noted in the right subclavian region. Telemetry leads overlie the chest. IMPRESSION: Hyperexpansion without acute cardiopulmonary findings. Electronically Signed   By: Misty Stanley M.D.   On: 04/19/2022 13:09   ? ?Procedures ?Procedures  ? ? ?Medications Ordered in ED ?Medications  ?sodium chloride flush (NS) 0.9 % injection 3 mL (3 mLs Intravenous Not Given 04/19/22 1125)  ? ? ?ED Course/ Medical Decision Making/ A&P ?  ?                        ?Medical Decision Making ?Amount and/or Complexity of Data Reviewed ?Labs: ordered. ?Radiology: ordered. ? ? ?71 y/o lady with PAF; CAC and Aortic Atherosclerosis, MVP, and HLD, aortic dissection, s/p Surgery (at [facility]) complicated by R arm compartment syndrome s/p surgery and BMS to subclavian presenting for fever, HA. Also has associated nasal congestion.  On exam patient is noted to be remarkably well-appearing  in no acute distress.  Her vital signs are noted for slight bradycardia, initially slightly soft blood pressure but all subsequent blood pressures were within normal limits.  She currently does not have any ongoing symptoms and no ongoing fever.  Basic labs were reviewed, no leukocytosis, no electrolyte derangement.  Will check lactate, blood cultures given her medical history.  Will additionally check CXR and COVID/flu testing. ? ?COVID and flu test is negative.  CXR is negative.  UA negative for infection.  No evidence for pneumonia.  Independently reviewed.  Lactic acid is normal.  I  reassessed patient and discussed all of her results.  She denies any ongoing symptoms, specifically denies any sensation of chills or fevers and has no headache.  Given the work-up that was completed today and no ongoing symptoms and no ongoing fever, feel she can be discharged and managed in the outpatient setting.  Feel that most likely patient has acute viral illness.  Given no leukocytosis, no elevation in lactic acid, doubt sepsis, bacteremia.  Reviewed extensive return precautions with patient and discharge.  Advise follow-up with primary care doctor this week. ? ? ? ?After the discussed management above, the patient was determined to be safe for discharge.  The patient was in agreement with this plan and all questions regarding their care were answered.  ED return precautions were discussed and the patient will return to the ED with any significant worsening of condition. ? ? ? ? ? ? ? ?Final Clinical Impression(s) / ED Diagnoses ?Final diagnoses:  ?Fever, unspecified fever cause  ?Acute viral syndrome  ? ? ?Rx / DC Orders ?ED Discharge Orders   ? ? None  ? ?  ? ? ?  ?Lucrezia Starch, MD ?04/19/22 1524 ? ?

## 2022-04-19 NOTE — ED Notes (Signed)
Pt ambulated independently to bathroom, urine sample collected. ?

## 2022-04-19 NOTE — Discharge Instructions (Addendum)
Follow-up with your primary care doctor.  Call their office tomorrow morning to request follow-up appointment sometime in the next couple days.  If you have worsening headache, fever, neck pain or neck stiffness, vomiting or other new concerning symptom, please return immediately to ER for reassessment. ?

## 2022-04-19 NOTE — ED Triage Notes (Addendum)
Pt reports headache and fever (Tmax 101.8) x 3 days with chills and mild dizziness.  States headache is relieved with Tylenol. ? ?Hypotensive ?

## 2022-04-20 ENCOUNTER — Other Ambulatory Visit: Payer: Self-pay

## 2022-04-20 ENCOUNTER — Other Ambulatory Visit: Payer: Self-pay | Admitting: Internal Medicine

## 2022-04-20 DIAGNOSIS — M810 Age-related osteoporosis without current pathological fracture: Secondary | ICD-10-CM

## 2022-04-24 LAB — CULTURE, BLOOD (ROUTINE X 2)
Culture: NO GROWTH
Culture: NO GROWTH
Special Requests: ADEQUATE
Special Requests: ADEQUATE

## 2022-04-27 NOTE — Telephone Encounter (Signed)
NA

## 2022-04-28 ENCOUNTER — Telehealth: Payer: Self-pay | Admitting: Pharmacy Technician

## 2022-04-28 NOTE — Telephone Encounter (Signed)
Prolia BIV pending - via portal ?Will f/u with response ?

## 2022-05-06 ENCOUNTER — Encounter: Payer: Self-pay | Admitting: Pulmonary Disease

## 2022-05-06 ENCOUNTER — Telehealth: Payer: Self-pay | Admitting: Pharmacy Technician

## 2022-05-06 NOTE — Telephone Encounter (Addendum)
Auth Submission: no auth needed Payer: medicare A/[employer] & [employer] Medication & CPT/J Code(s) submitted: Prolia (Denosumab) [id number] Route of submission (phone, fax, portal): AMGEN Auth type: Buy/Bill Units/visits requested: X1 DOSE Reference numberNeldon Newport BIV ID# [id number] Approval from: 05/06/22 to 12/20/22   Medicare and supplement coverage reviewed and auth approval extended to 12/21/23

## 2022-05-06 NOTE — Telephone Encounter (Signed)
error 

## 2022-05-12 DIAGNOSIS — Z8679 Personal history of other diseases of the circulatory system: Secondary | ICD-10-CM | POA: Insufficient documentation

## 2022-05-12 DIAGNOSIS — I71011 Dissection of aortic arch: Secondary | ICD-10-CM | POA: Insufficient documentation

## 2022-05-13 ENCOUNTER — Ambulatory Visit (INDEPENDENT_AMBULATORY_CARE_PROVIDER_SITE_OTHER): Payer: Medicare Other

## 2022-05-13 VITALS — BP 120/73 | HR 54 | Temp 97.5°F | Resp 16 | Ht 66.0 in | Wt 115.4 lb

## 2022-05-13 DIAGNOSIS — M810 Age-related osteoporosis without current pathological fracture: Secondary | ICD-10-CM

## 2022-05-13 MED ORDER — DENOSUMAB 60 MG/ML ~~LOC~~ SOSY
60.0000 mg | PREFILLED_SYRINGE | Freq: Once | SUBCUTANEOUS | Status: AC
  Administered 2022-05-13: 60 mg via SUBCUTANEOUS

## 2022-05-13 NOTE — Progress Notes (Signed)
Diagnosis: Osteoporosis  Provider:  Praveen Mannam, MD  Procedure: Injection  Prolia, Dose: 60 mg, Site: subcutaneous, Number of injections: 1  Discharge: Condition: Good, Destination: Home . AVS provided to patient.   Performed by:  Avonda Toso, RN       

## 2022-05-26 DIAGNOSIS — I340 Nonrheumatic mitral (valve) insufficiency: Secondary | ICD-10-CM | POA: Diagnosis not present

## 2022-05-26 DIAGNOSIS — Z95828 Presence of other vascular implants and grafts: Secondary | ICD-10-CM | POA: Diagnosis not present

## 2022-05-26 DIAGNOSIS — Z4889 Encounter for other specified surgical aftercare: Secondary | ICD-10-CM | POA: Diagnosis not present

## 2022-05-26 DIAGNOSIS — Z9889 Other specified postprocedural states: Secondary | ICD-10-CM | POA: Diagnosis not present

## 2022-05-26 DIAGNOSIS — I7779 Dissection of other artery: Secondary | ICD-10-CM | POA: Diagnosis not present

## 2022-05-26 DIAGNOSIS — Z8679 Personal history of other diseases of the circulatory system: Secondary | ICD-10-CM | POA: Diagnosis not present

## 2022-05-26 DIAGNOSIS — I71019 Dissection of thoracic aorta, unspecified: Secondary | ICD-10-CM | POA: Diagnosis not present

## 2022-06-15 DIAGNOSIS — R200 Anesthesia of skin: Secondary | ICD-10-CM | POA: Diagnosis not present

## 2022-06-15 DIAGNOSIS — I7779 Dissection of other artery: Secondary | ICD-10-CM | POA: Diagnosis not present

## 2022-08-09 ENCOUNTER — Encounter: Payer: Self-pay | Admitting: Internal Medicine

## 2022-08-11 ENCOUNTER — Encounter: Payer: Self-pay | Admitting: Internal Medicine

## 2022-08-11 ENCOUNTER — Telehealth: Payer: Self-pay | Admitting: Internal Medicine

## 2022-08-11 DIAGNOSIS — I7779 Dissection of other artery: Secondary | ICD-10-CM

## 2022-08-11 NOTE — Telephone Encounter (Signed)
Pt also called into the office left a message to call back.

## 2022-08-11 NOTE — Telephone Encounter (Signed)
Follow Up:     Patient says she is returning Kristine Davidson's call from today.

## 2022-08-11 NOTE — Telephone Encounter (Signed)
Called pt advised Dr. Gasper Sells will order right upper extremity arterial duplex dx hx subclavian dissection.  Advised pt a scheduler will call to schedule.

## 2022-08-11 NOTE — Telephone Encounter (Signed)
Left a message to call back.

## 2022-08-11 NOTE — Telephone Encounter (Signed)
Pt is requesting call back in regards to MyChart message sent 08/20 and to get advice on appt.  Pt says that she also needs another ultrasound done and would like to know what she needs to do to have this scheduled before her appt with [facility] on 08/28. Please advise.

## 2022-08-11 NOTE — Telephone Encounter (Signed)
Please see mychart encounter

## 2022-08-11 NOTE — Telephone Encounter (Signed)
Duplicate telephone note.

## 2022-08-12 NOTE — Telephone Encounter (Signed)
Pt scheduled for right upper arterial duplex on 08/13/22.

## 2022-08-13 ENCOUNTER — Ambulatory Visit (HOSPITAL_COMMUNITY)
Admission: RE | Admit: 2022-08-13 | Discharge: 2022-08-13 | Disposition: A | Discharge status: Home / Self Care | Payer: Medicare Other | Source: Ambulatory Visit | Attending: Cardiology | Admitting: Cardiology

## 2022-08-13 DIAGNOSIS — Z95828 Presence of other vascular implants and grafts: Secondary | ICD-10-CM

## 2022-08-13 DIAGNOSIS — I7779 Dissection of other artery: Secondary | ICD-10-CM | POA: Insufficient documentation

## 2022-08-17 DIAGNOSIS — I7779 Dissection of other artery: Secondary | ICD-10-CM | POA: Diagnosis not present

## 2022-08-19 ENCOUNTER — Encounter: Payer: Self-pay | Admitting: Internal Medicine

## 2022-08-19 ENCOUNTER — Ambulatory Visit: Payer: Medicare Other | Attending: Internal Medicine | Admitting: Internal Medicine

## 2022-08-19 VITALS — BP 112/60 | HR 62 | Ht 65.0 in | Wt 119.0 lb

## 2022-08-19 DIAGNOSIS — I7100 Dissection of unspecified site of aorta: Secondary | ICD-10-CM | POA: Insufficient documentation

## 2022-08-19 DIAGNOSIS — Z5181 Encounter for therapeutic drug level monitoring: Secondary | ICD-10-CM | POA: Insufficient documentation

## 2022-08-19 DIAGNOSIS — I480 Paroxysmal atrial fibrillation: Secondary | ICD-10-CM | POA: Diagnosis not present

## 2022-08-19 NOTE — Patient Instructions (Signed)
Medication Instructions:  NO CHANGES *If you need a refill on your cardiac medications before your next appointment, please call your pharmacy*   Lab Work: NONE If you have labs (blood work) drawn today and your tests are completely normal, you will receive your results only by: MyChart Message (if you have MyChart) OR A paper copy in the mail If you have any lab test that is abnormal or we need to change your treatment, we will call you to review the results.   Testing/Procedures: NONE   Follow-Up: At [facility], you and your health needs are our priority.  As part of our continuing mission to provide you with exceptional heart care, we have created designated Provider Care Teams.  These Care Teams include your primary Cardiologist (physician) and Advanced Practice Providers (APPs -  Physician Assistants and Nurse Practitioners) who all work together to provide you with the care you need, when you need it.  We recommend signing up for the patient portal called "MyChart".  Sign up information is provided on this After Visit Summary.  MyChart is used to connect with patients for Virtual Visits (Telemedicine).  Patients are able to view lab/test results, encounter notes, upcoming appointments, etc.  Non-urgent messages can be sent to your provider as well.   To learn more about what you can do with MyChart, go to [email or web address].    Your next appointment:   1 year(s)  The format for your next appointment:   In Person  Provider:   Mahesh A Chandrasekhar, MD     Other Instructions NONE  Important Information About Sugar       

## 2022-08-19 NOTE — Progress Notes (Signed)
Cardiology Office Note:    Date:  08/19/2022   ID:  Kristine Davidson, DOB 07/10/1951, MRN [id number]  PCP:  Michael Boston, MD   [facility] Providers Cardiologist:  Werner Lean, MD     Referring MD: Michael Boston, MD   CC: Follow up aortic dissection  History of Present Illness:    Kristine Davidson is a 71 y.o. female with a hx of PAF; CAC and Aortic Atherosclerosis, MVP, and HLD.  Seen incidentally for the an aortic dissection.  S/p Surgery (at [facility]) complicated by R arm compartment syndrome s/p surgery and BMS to subclavian (R pox, R distal R axillary).   Since stopping amiodarone has called in with palpitations and increase fatigue. 2023: In routine follow up.  Saw [facility]: we performed subclavian imaging for this patient.   Patient notes that she is doing well.   Since last visit notes walking her dog and getting back into normal life . There are no interval hospital/ED visit.    No chest pain or pressure .  No SOB/DOE and no PND/Orthopnea.  No weight gain or leg swelling.  Rare nocturnal  palpitations; no syncope  Slight bruising.   Past Medical History:  Diagnosis Date   Allergy    seasonal allergies   Anemia    hx of    Anxiety    on meds   Aortic atherosclerosis (HCC)    Arthritis    generalized   B12 deficiency 03/31/2012   Depression    on meds--hx of   Diverticular disease    Emphysema of lung ([place])    HSV-2 (herpes simplex virus 2) infection    Hyperlipidemia    diet controlled   Hypertension    Mitral valve prolapse    Osteoporosis 01/14/2018   DEXA scan at [place] 01/14/18 T score -2.8 at Rt femur neck and L1-L4 AP spine   Substance abuse ([place])    Thoracic aortic ectasia (HCC)    Thyroid disease    on meds   Tobacco dependence    Vitamin D deficiency     Past Surgical History:  Procedure Laterality Date   COLONOSCOPY     D and C     TONSILLECTOMY     VARICOSE VEIN SURGERY  2000   Vascular Surgery [place] upper extremity  arterial duplex right        Current Medications: Current Meds  Medication Sig   acetaminophen (TYLENOL) 325 MG tablet as needed for pain.   aspirin 81 MG EC tablet daily.   buPROPion (WELLBUTRIN XL) 150 MG 24 hr tablet Take by mouth daily.   ELIQUIS 2.5 MG TABS tablet Take 2.5 mg by mouth 2 (two) times daily.   levothyroxine (SYNTHROID) 50 MCG tablet TAKE 1 TABLET (50 MCG TOTAL) BY MOUTH DAILY.   metoprolol tartrate (LOPRESSOR) 25 MG tablet Take 12.5 mg by mouth daily.   PARoxetine (PAXIL) 30 MG tablet Take by mouth daily.   polyethylene glycol (MIRALAX / GLYCOLAX) 17 g packet at bedtime.   rosuvastatin (CRESTOR) 20 MG tablet Take 20 mg by mouth daily.   Sennosides (SENOKOT PO) every morning.   traZODone (DESYREL) 50 MG tablet Take 1-2 tablets (50-100 mg total) by mouth at bedtime.   valACYclovir (VALTREX) 500 MG tablet Take 1 tablet (500 mg total) by mouth at bedtime.     Allergies:   Quinolones, Codeine, and Nickel   Social History   Socioeconomic History   Marital status: Divorced  Spouse name: Not on file   Number of children: 0   Years of education: Not on file   Highest education level: Professional school degree (e.g., MD, DDS, DVM, JD)  Occupational History   Occupation: Teacher    Employer: [employer]    Comment: Retired  Tobacco Use   Smoking status: Every Day    Packs/day: 0.50    Years: 43.00    Total pack years: 21.50    Types: Cigarettes   Smokeless tobacco: Never   Tobacco comments:    3 cigarettes daily 10/27/2021  Vaping Use   Vaping Use: Never used  Substance and Sexual Activity   Alcohol use: No    Alcohol/week: 0.0 standard drinks of alcohol   Drug use: No   Sexual activity: Never  Other Topics Concern   Not on file  Social History Narrative   Lives alone.    Social Determinants of Health   Financial Resource Strain: Low Risk  (11/02/2017)   Overall Financial Resource Strain (CARDIA)    Difficulty of Paying  Living Expenses: Not very hard  Food Insecurity: No Food Insecurity (11/02/2017)   Hunger Vital Sign    Worried About Running Out of Food in the Last Year: Never true    Ran Out of Food in the Last Year: Never true  Transportation Needs: No Transportation Needs (11/02/2017)   PRAPARE - Hydrologist (Medical): No    Lack of Transportation (Non-Medical): No  Physical Activity: Inactive (11/02/2017)   Exercise Vital Sign    Days of Exercise per Week: 0 days    Minutes of Exercise per Session: 0 min  Stress: No Stress Concern Present (11/02/2017)   [place]    Feeling of Stress : Not at all  Social Connections: Somewhat Isolated (11/02/2017)   Social Connection and Isolation Panel [NHANES]    Frequency of Communication with Friends and Family: More than three times a week    Frequency of Social Gatherings with Friends and Family: More than three times a week    Attends Religious Services: Never    Marine scientist or Organizations: Yes    Attends Music therapist: More than 4 times per year    Marital Status: Divorced    Social: Comes with family that sometimes come, I met her in the echo lab, has a dog  Family History: The patient's family history includes Breast cancer (age of onset: 67) in her mother; Diabetes in her sister; Macular degeneration in her mother; Parkinson's disease in her father and sister; Stroke in her mother. There is no history of Colon polyps, Colon cancer, Esophageal cancer, Stomach cancer, or Rectal cancer.  ROS:   Please see the history of present illness.     All other systems reviewed and are negative.  EKGs/Labs/Other Studies Reviewed:    The following studies were reviewed today:  EKG:   08/19/22:  03/19/22:  Sinus bradycardia 56  Transthoracic Echocardiogram: [id number] diagnosis of dissection Results: 1. The mitral valve is normal in  structure. No evidence of mitral valve  regurgitation. No evidence of mitral stenosis.   2. Moderate pericardial effusion. The pericardial effusion is  circumferential.   3. The inferior vena cava is dilated in size with <50% respiratory  variability, suggesting right atrial pressure of 15 mmHg.   4. There is evidence of an aortic dissection with a dissection flap above  the S  inus of Valsalva. This begins to extendin to the aortic arch.   5. Left ventricular ejection fraction, by estimation, is 60 to 65%. The  left ventricle has normal function. The left ventricle has no regional  wall motion abnormalities. There is mild concentric left ventricular  hypertrophy. Left ventricular diastolic  parameters are consistent with Grade II diastolic dysfunction  (pseudonormalization).   6. Right ventricular systolic function is normal. The right ventricular  size is normal. There is mildly elevated pulmonary artery systolic  pressure. The estimated right ventricular systolic pressure is 29.5 mmHg.   7. The aortic valve is tricuspid. Aortic valve regurgitation is mild. No  aortic stenosis is present.   Comparison(s): No prior Echocardiogram.   Conclusion(s)/Recommendation(s): Emergent finding: Patient sent to ED for  stat imaging. Discussed with Patient, CT surgery, ED receiving team.    Cardiac CT: Date: 11/05/21 Results: 1. Mild CAD in proximal-mid RCA and mid RCA, CADRADS = 2.   2. Coronary calcium score is 193 , which places the patient in the 77th percentile for age and sex matched control.   3. Normal coronary origins with right dominance.   4. Ascending aortic aneurysm with ascending aorta Type A dissection, visualized previously on CTA aorta exam 11/04/21 and described in radiologist's dictation. Aorta measures 40 mm at sinus of Valsalva and 49 mm at mid ascending aorta   5.  Moderate circumferential pericardial effusion.   Recent Labs: 12/01/2021: ALT 31; TSH  9.050 04/19/2022: BUN 9; Creatinine, Ser 0.66; Hemoglobin 9.7; Platelets 194; Potassium 3.9; Sodium 133  Recent Lipid Panel    Component Value Date/Time   CHOL 96 11/05/2021 0610   CHOL 177 11/10/2017 1042   TRIG 61 11/05/2021 0610   HDL 42 11/05/2021 0610   HDL 79 11/10/2017 1042   CHOLHDL 2.3 11/05/2021 0610   VLDL 12 11/05/2021 0610   LDLCALC 42 11/05/2021 0610   LDLCALC 77 11/10/2017 1042    Risk Assessment/Calculations:    CHA2DS2-VASc Score = 3   This indicates a 3.2% annual risk of stroke. The patient's score is based upon: CHF History: 0 HTN History: 0 Diabetes History: 0 Stroke History: 0 Vascular Disease History: 1 (aortic atherosclerosis) Age Score: 1 Gender Score: 1           Physical Exam:    VS:  BP 112/60   Pulse 62   Ht _0  (1.651 m)   Wt 54 kg   SpO2 95%   BMI 19.80 kg/m     Wt Readings from Last 3 Encounters:  08/19/22 54 kg  05/13/22 52.3 kg  04/19/22 52.5 kg    Gen: no distress, thin female  Neck: No JVD, R subclavian bruit Cardiac: No Rubs or Gallops, no Murmur, normal rhythm +2 radial pulses Respiratory: Clear to auscultation bilaterally, normal effort, normal  respiratory rate GI: Soft, nontender, non-distended  MS: No  edema;  moves all extremities Integument: Skin feels warm, has R arm scar from fasciotomy, and well heal scar from surgical interventions Neuro:  At time of evaluation, alert and oriented to person/place/time/situation  Psych: Normal affect, patient feels tired but well  ASSESSMENT:    No diagnosis found.  PLAN:    PAF Mild CAD and Aortic Atherosclerosis &HLD S/p subclavian and axillary PCI S/p Aortic Dissection MVP - ASA and eliquis; will reach out to Dr. Charlies Constable; if he is in agreement we will switch to eliquis monotherapy - is smoke free - continue crestor LDL goal < 55 - quinolone allergy  is listed - discussed family screening at prior visit - no further AF but has rare palpitations still; will continue  eliquis  One year with me        Medication Adjustments/Labs and Tests Ordered: Current medicines are reviewed at length with the patient today.  Concerns regarding medicines are outlined above.  No orders of the defined types were placed in this encounter.  No orders of the defined types were placed in this encounter.    There are no Patient Instructions on file for this visit.   Signed, Werner Lean, MD  08/19/2022 2:12 PM    [facility]

## 2022-09-14 ENCOUNTER — Telehealth: Payer: Self-pay | Admitting: Internal Medicine

## 2022-09-14 NOTE — Telephone Encounter (Signed)
-----   Message from Werner Lean, MD sent at 09/01/2022  6:31 PM EDT ----- Regarding: RE: Stop plavix continue eliquis only She told me she was taking both.  I reached out to Dr. Charlies Constable.  He was ok to back off to monotherapy ----- Message ----- From: Precious Gilding, RN Sent: 09/01/2022   1:53 PM EDT To: Werner Lean, MD Subject: RE: Stop plavix continue eliquis only          Don't see where this pt was ever on clopidogrel.    Elza Rafter, RN  ----- Message ----- From: Werner Lean, MD Sent: 08/28/2022   8:02 PM EDT To: Precious Gilding, RN Subject: Stop plavix continue eliquis only              Heard back from Dr. Charlies Constable at [facility]; we are both in agreement. Thanks, MAC

## 2022-09-14 NOTE — Telephone Encounter (Signed)
  Pt said, she received a call from [place] and she is returning her call

## 2022-09-14 NOTE — Telephone Encounter (Signed)
Called pt advised that MD would like pt to stop Aspirin and continue Eliquis 5 mg PO BID.  Pt already took medication today and will stop taking.  No questions or concerns voice.

## 2022-09-17 ENCOUNTER — Encounter: Payer: Self-pay | Admitting: Internal Medicine

## 2022-10-07 DIAGNOSIS — Z23 Encounter for immunization: Secondary | ICD-10-CM | POA: Diagnosis not present

## 2022-10-20 DIAGNOSIS — F331 Major depressive disorder, recurrent, moderate: Secondary | ICD-10-CM | POA: Diagnosis not present

## 2022-10-20 DIAGNOSIS — M810 Age-related osteoporosis without current pathological fracture: Secondary | ICD-10-CM | POA: Diagnosis not present

## 2022-10-20 DIAGNOSIS — I480 Paroxysmal atrial fibrillation: Secondary | ICD-10-CM | POA: Diagnosis not present

## 2022-10-20 DIAGNOSIS — E039 Hypothyroidism, unspecified: Secondary | ICD-10-CM | POA: Diagnosis not present

## 2022-10-20 DIAGNOSIS — D649 Anemia, unspecified: Secondary | ICD-10-CM | POA: Diagnosis not present

## 2022-10-20 DIAGNOSIS — J439 Emphysema, unspecified: Secondary | ICD-10-CM | POA: Diagnosis not present

## 2022-10-21 ENCOUNTER — Other Ambulatory Visit: Payer: Self-pay | Admitting: Internal Medicine

## 2022-10-21 DIAGNOSIS — M810 Age-related osteoporosis without current pathological fracture: Secondary | ICD-10-CM

## 2022-10-31 DIAGNOSIS — Z23 Encounter for immunization: Secondary | ICD-10-CM | POA: Diagnosis not present

## 2022-11-03 DIAGNOSIS — Z7901 Long term (current) use of anticoagulants: Secondary | ICD-10-CM | POA: Insufficient documentation

## 2022-11-04 ENCOUNTER — Encounter: Payer: Self-pay | Admitting: Internal Medicine

## 2022-11-17 DIAGNOSIS — Z9889 Other specified postprocedural states: Secondary | ICD-10-CM | POA: Diagnosis not present

## 2022-11-17 DIAGNOSIS — I71019 Dissection of thoracic aorta, unspecified: Secondary | ICD-10-CM | POA: Diagnosis not present

## 2022-11-17 DIAGNOSIS — Z7901 Long term (current) use of anticoagulants: Secondary | ICD-10-CM | POA: Diagnosis not present

## 2022-11-17 DIAGNOSIS — I71011 Dissection of aortic arch: Secondary | ICD-10-CM | POA: Diagnosis not present

## 2022-11-17 DIAGNOSIS — Z4889 Encounter for other specified surgical aftercare: Secondary | ICD-10-CM | POA: Diagnosis not present

## 2022-11-17 DIAGNOSIS — I708 Atherosclerosis of other arteries: Secondary | ICD-10-CM | POA: Diagnosis not present

## 2022-11-17 DIAGNOSIS — Z48812 Encounter for surgical aftercare following surgery on the circulatory system: Secondary | ICD-10-CM | POA: Diagnosis not present

## 2022-11-17 DIAGNOSIS — I340 Nonrheumatic mitral (valve) insufficiency: Secondary | ICD-10-CM | POA: Diagnosis not present

## 2022-11-17 DIAGNOSIS — Z8679 Personal history of other diseases of the circulatory system: Secondary | ICD-10-CM | POA: Diagnosis not present

## 2022-11-17 DIAGNOSIS — Z95828 Presence of other vascular implants and grafts: Secondary | ICD-10-CM | POA: Diagnosis not present

## 2022-11-17 DIAGNOSIS — I7776 Dissection of artery of upper extremity: Secondary | ICD-10-CM | POA: Diagnosis not present

## 2022-11-19 NOTE — Progress Notes (Unsigned)
Cardiology Office Note:    Date:  12/02/2022   ID:  Kristine Davidson, DOB 09-12-1951, MRN [id number]  PCP:  Michael Boston, MD  [place] [facility] Cardiologist:  Werner Lean, MD     Referring MD: Michael Boston, MD   Chief Complaint:  Follow-up     History of Present Illness:   Kristine Davidson is a 71 y.o. female with a hx of PAF; CAC and Aortic Atherosclerosis-cardiac CT 10/2021 mild CAD p-mRCA calcium score 193 , MVP, and HLD.  Seen incidentally for the an aortic dissection.  S/p ascending/hemiarch replacement on 71/69/67 at [facility] complicated by R arm compartment syndrome s/p surgery and BMS to subclavian (R pox, R distal R axillary).    Since stopping amiodarone has called in with palpitations and increase fatigue.  Last saw Dr. Gasper Sells 07/2022 and had rare palpitations. He reached out to Dr. Charlies Constable and they agreed monotherapy with eliquis so ASA stopped.    Echo at [facility] 11/17/22 normal LVEF, mod MR-recommend repeat echo spring 2024. CT showed repair of ascending thoracic aorta no leak. Short segment dissection flap involving proximal right subclavian with patent stents in right subclavian and axillary arterty.  Patient comes in for f/u. Has chronic DOE when walking her dog but that is not unusual and been going on for awhile. No palpitations. Occasionally has a catch under her left breast when she takes a deep breath. She was in the ED 11/22/22 with left shoulder pain that came around under her left breast. Left shoulder pain has resolved but still gets the catch under her left breast.  Troponins negative, cxr and left shoulder xrays normal.  EKG sinus bradycardia. Was given lidocaine patch which she's not sure it helped.  It feels better when she presses on it. Discussed echo in detail and reassurance given.     Past Medical History:  Diagnosis Date   Allergy    seasonal allergies   Anemia    hx of    Anxiety    on meds   Aortic atherosclerosis ([place])     Arthritis    generalized   B12 deficiency 03/31/2012   Depression    on meds--hx of   Diverticular disease    Emphysema of lung ([place])    HSV-2 (herpes simplex virus 2) infection    Hyperlipidemia    diet controlled   Hypertension    Mitral valve prolapse    Osteoporosis 01/14/2018   DEXA scan at [facility] 01/14/18 T score -2.8 at Rt femur neck and L1-L4 AP spine   Substance abuse (H[facility])    Thoracic aortic ectasia (HCC)    Thyroid disease    on meds   Tobacco dependence    Vitamin D deficiency    Current Medications: Current Meds  Medication Sig   acetaminophen (TYLENOL) 325 MG tablet as needed for pain.   buPROPion (WELLBUTRIN XL) 300 MG 24 hr tablet Take 300 mg by mouth daily.   ELIQUIS 2.5 MG TABS tablet Take 2.5 mg by mouth 2 (two) times daily.   levothyroxine (SYNTHROID) 50 MCG tablet TAKE 1 TABLET (50 MCG TOTAL) BY MOUTH DAILY.   metoprolol tartrate (LOPRESSOR) 25 MG tablet Take 12.5 mg by mouth 2 (two) times daily.   PARoxetine (PAXIL) 30 MG tablet Take by mouth daily.   polyethylene glycol (MIRALAX / GLYCOLAX) 17 g packet at bedtime.   rosuvastatin (CRESTOR) 20 MG tablet Take 20 mg by mouth daily.   traZODone (DESYREL)  50 MG tablet Take 1-2 tablets (50-100 mg total) by mouth at bedtime.   valACYclovir (VALTREX) 500 MG tablet Take 1 tablet (500 mg total) by mouth at bedtime.    Allergies:   Quinolones, Codeine, and Nickel   Social History   Tobacco Use   Smoking status: Every Day    Packs/day: 0.50    Years: 43.00    Total pack years: 21.50    Types: Cigarettes   Smokeless tobacco: Never   Tobacco comments:    3 cigarettes daily 10/27/2021  Vaping Use   Vaping Use: Never used  Substance Use Topics   Alcohol use: No    Alcohol/week: 0.0 standard drinks of alcohol   Drug use: No    Family Hx: The patient's family history includes Breast cancer (age of onset: 78) in her mother; Diabetes in her sister; Macular degeneration in her mother; Parkinson's disease  in her father and sister; Stroke in her mother. There is no history of Colon polyps, Colon cancer, Esophageal cancer, Stomach cancer, or Rectal cancer.  ROS     Physical Exam:    VS:  BP 120/66   Pulse (!) 54   Ht '5\' 6"'$  (1.676 m)   Wt 125 lb 3.2 oz (56.8 kg)   SpO2 99%   BMI 20.21 kg/m     Wt Readings from Last 3 Encounters:  12/02/22 125 lb 3.2 oz (56.8 kg)  11/25/22 125 lb 9.6 oz (57 kg)  11/22/22 119 lb (54 kg)    Physical Exam  GEN: Well nourished, well developed, in no acute distress  HEENT: normal  Neck: no JVD, carotid bruits, or masses Cardiac:RRR; no murmurs, rubs, or gallops  Respiratory:  clear to auscultation bilaterally, normal work of breathing GI: soft, nontender, nondistended, + BS Ext: without cyanosis, clubbing, or edema, Good distal pulses bilaterally MS: no deformity or atrophy  Skin: warm and dry, no rash Neuro:  Alert and Oriented x 3, Strength and sensation are intact Psych: euthymic mood, full affect        EKGs/Labs/Other Test Reviewed:    EKG:  EKG is  not ordered today.  The ekg reviewed from ED 11/23/22 sinus bradycardia, no acute change  Recent Labs: 11/22/2022: BUN 13; Creatinine, Ser 0.60; Hemoglobin 10.9; Platelets 161; Potassium 3.8; Sodium 138   Recent Lipid Panel No results for input(s): "CHOL", "TRIG", "HDL", "VLDL", "LDLCALC", "LDLDIRECT" in the last 8760 hours.   Prior CV Studies:    CT [facility] 11/17/22 11/17/2022 2:04 PM EST  Procedure: CTA Chest with and without contrast Procedure: CTA Abdomen and pelvis with and without contrast  Indication: Thoracic aorta disease, post-op, type a dissection s/p repair, Z61.096 Presence of other vascular implants and grafts, Z98.890 Other specified postprocedural states, Z86.79 Personal history of other diseases of the circulatory system, Z48.89 Encounter for other specified surgical aftercare  Comparison: CT 05/26/2022  Technique:  TAA protocol, Helical non ECG gated single source.  Volumetric pre and post contrasted, arterial only, images through the chest, abdomen and pelvis were obtained. This study was acquired following the IV administration of iodinated contrast material, given the patient's indications for the examination.  If IV contrast material had not been administered, the likelihood of detecting abnormalities relevant to the patient's condition would have been substantially decreased.  Complex image reconstruction post processing was likewise performed as indicated to increase the sensitivity of detecting clinically relevant pathology. Evaluation of solid organs may be limited due dedicated imaging technique for vascular processes as per indication  for examination.  FINDINGS:  CTA Chest:  Heart: No pericardial effusion. Tricuspid aortic valve. Mild three-vessel coronary artery calcifications. Normal caliber main pulmonary artery. Thoracic aorta: Ascending thoracic aorta at the level of the Sinus Valsalva measures 31 x 35 x 36 mm on double oblique reformatted imaging. Postsurgical changes of ascending thoracic aorta repair. No evidence of postsurgical leak or pseudoaneurysm. Stable size of the aortic arch and descending thoracic aorta with scattered mixed plaque. The distal aortic arch measures 34 mm on double oblique reformatted imaging, similar. Descending thoracic aorta measures up to 33 x 30 mm proximally, similar.  Visualized bilateral common carotid and vertebral arteries: Patent without significant stenosis. Subclavian arteries: Similar focal dissection flap involving the proximal right subclavian artery. Patent right axillary and subclavian stent with similar peripheral filling defect which may represent in-stent thrombosis/hyperplasia. Patent left subclavian artery without significant stenosis.   CTA Abdomen and pelvis:  Abdominal aorta: Normal caliber. Mild to moderate scattered calcified plaque, worst in the infrarenal aorta. Celiac  artery: Patent. Mild calcified plaque at the ostium without significant stenosis. SMA: Patent. Minimal calcified plaque at the ostium without significant stenosis IMA: Patent. Single bilateral renal arteries are patent without significant stenosis or plaque. Common femoral, external iliac, common femoral, proximal SFA and proximal profunda arteries: Moderate calcified plaque of the bilateral common iliac arteries which are patent with mild multifocal narrowing. Minimal calcified plaque of the bilateral external iliac arteries which are patent without significant stenosis. Mild calcified plaque of the bilateral common femoral arteries with minimal associated stenosis. Visualized superficial and profunda femoris arteries are patent.  CT Chest, Abdomen and Pelvis:  Normal thyroid.  Mild bilateral bronchial wall thickening and scattered mucous plugging. A few sub-6 mm solid indeterminate pulmonary nodule are stable, for example as marked on series 12, image 193. Posterior dependent atelectasis. No pulmonary mass or consolidation. Patent central airways. No pleural effusion or pneumothorax.  Mild biatrial enlargement. Mild coronary artery calcifications. No axillary, mediastinal, or hilar lymphadenopathy.  Stable subcentimeter right inferior hepatic hypodensity, likely a cyst. No hydronephrosis. Normal caliber small bowel and colon. Hysterectomy. No suspicious adnexal mass. No free air or fluid. No lymphadenopathy.  S-shaped scoliotic curvature and spondylosis of the thoracolumbar spine. Similar sacral Tarlov cysts. Prior median sternotomy.  IMPRESSION: 1. Status post repair of the ascending thoracic aorta. No evidence of postsurgical leak or pseudoaneurysm. 2. Similar focal short segment dissection flap involving the proximal right subclavian artery. 3. Similar appearance of patent right subclavian and axillary artery stents.   Echo [facility] 11/17/22 ECHOCARDIOGRAPHIC MEASUREMENTS  ----------------------------------------------- 2D DIMENSIONS AORTA Values Normal RangeMAIN PA Values Normal Range Annulus: nm* cm [1.9 - 2.7] PA Main: 1.8 cm [1.5 - 2.1] Aorta Sin: 3.6 cm [2.4 - 3.6] RIGHT VENTRICLE ST Junction: nm* cm [2 - 3.2] RV Base: 3.4 cm [2.5 - 4.1] Asc.Aorta: nm* cm [1.9 - 3.5] RV Mid: 2.1 cm [1.9 - 3.5] LEFT VENTRICLE RV Length: nm* cm '[ ]'$  LVIDd: 4.4 cm [3.7 - 5.3] RIGHT ATRIUM LVIDs: 3.0 cm [2.2 - 3.4] RA Area: 15 cm2 [ <= 20] LVEDVi: 73.0 ml/m2 [29 - 61] RAVi: nm* ml/m2 [15 - 27] LVESVi: 26.0 ml/m2 [8 - 24] INFERIOR VENA CAVA FS: 32 % [ >= 25] Max.IVC: 3.0 cm [ <= 2.1] SWT: 0.7 cm [0.6 - 0.9] Min.IVC: nm* cm [ <= 1.7] PWT: 0.7 cm [0.6 - 0.9] __________________ LEFT ATRIUM nm* - not measured LA Diam: nm* cm [2.7 - 3.8] LA Area: 24 cm2 [ <= 20] LA Volume:  78 ml [22 - 52] LAVi: 51 ml/m2 [16 - 34]  ECHOCARDIOGRAPHIC DESCRIPTIONS ----------------------------------------------- AORTIC ROOT Size: Normal Dissection: INDETERM FOR DISSECTION AO Note: 28MMX30CM GRAFT  AORTIC VALVE Leaflets: Tricuspid Morphology: Normal Mobility: Fully Mobile  LEFT VENTRICLE Anterior: Normal Size: Normal Lateral: Normal Contraction: Normal Septal: Normal Closest EF: >55%(Estimated) Calc.EF: 65% (3D) Apical: Normal LV masses: No Masses Inferior: Normal LVH: None Posterior: Normal LV GLS(GE): -20.5% Normal Range [ <= -16] LV Note: FOCAL KNUCKLE OF HYPERTROPHY (1.4cm) IN BASAL INFEROLATERAL WALL Dias.FxClass: N/A  MITRAL VALVE Leaflets: ABNORMAL Mobility: Fully mobile Morphology: OTHER MV Note: MAD WITH MOD ANT/POST PROLAPSE AND THICKENED LEAFLETS  LEFT ATRIUM Size: SEVERELY ENLARGED LA masses: No masses Normal IAS  MAIN PA Size: Normal  PULMONIC VALVE Morphology: Normal Mobility: Fully Mobile  RIGHT VENTRICLE Size: Normal Free wall: Normal Contraction: Normal RV masses: No Masses TAPSE: 1.0 cm, Normal Range [>= 1.6 cm]  TRICUSPID VALVE Leaflets: Normal  Mobility: Fully mobile Morphology: Normal  RIGHT ATRIUM Size: Normal RA Other: None RA masses: No masses  PERICARDIUM Fluid: No effusion  INFERIOR VENACAVA Size: DILATED ABNORMAL RESPIRATORY COLLAPSE  DOPPLER ECHO and OTHER SPECIAL PROCEDURES ------------------------------------ Aortic: TRIVIAL AR No AS  Mitral: MODERATE MR No MS MV Inflow E Vel.= 141.0 cm/s MV Annulus E'Vel.= 9.0 cm/s E/E'Ratio= 16  Tricuspid: TRIVIAL TR No TS 2.4 m/s peak TR vel 38 mmHg peak RV pressure  Pulmonary: TRIVIAL PR No PS  Other:  INTERPRETATION --------------------------------------------------------------- NORMAL LEFT VENTRICULAR SYSTOLIC FUNCTION NORMAL RIGHT VENTRICULAR SYSTOLIC FUNCTION VALVULAR REGURGITATION: TRIVIAL AR, MODERATE MR, TRIVIAL PR, TRIVIAL TR NO VALVULAR STENOSIS MITRAL ANNULAR DYSJUNCTION (0.6cm) WITH MODERATE ANTERIOR AND POSTERIOR PROLAPSE AND MODERATE MR. EROA OF 0.2 CM2 MAY BE UNDERESTIMATED IN THE SETTING OF MULTIPLE MR JETS VISUALIZED. NO PRIOR STUDY FOR COMPARISON 3D acquisition and reconstructions were performed as part of this examination to more accurately quantify the effects of identified structural abnormalities as part of the exam. (post-processing on an Independent workstation).   (Report version 3.0) Interpreted and Electronically signed Perform. by: Caryl Pina, MD by: Randalyn Rhea, MD Resp.Person: Filiberto Pinks, RDCS On: 11/17/2022 12:58:35  Transthoracic Echocardiogram: [id number] diagnosis of dissection Results: 1. The mitral valve is normal in structure. No evidence of mitral valve  regurgitation. No evidence of mitral stenosis.   2. Moderate pericardial effusion. The pericardial effusion is  circumferential.   3. The inferior vena cava is dilated in size with <50% respiratory  variability, suggesting right atrial pressure of 15 mmHg.   4. There is evidence of an aortic dissection with a dissection flap above  the S inus of Valsalva. This  begins to extendin to the aortic arch.   5. Left ventricular ejection fraction, by estimation, is 60 to 65%. The  left ventricle has normal function. The left ventricle has no regional  wall motion abnormalities. There is mild concentric left ventricular  hypertrophy. Left ventricular diastolic  parameters are consistent with Grade II diastolic dysfunction  (pseudonormalization).   6. Right ventricular systolic function is normal. The right ventricular  size is normal. There is mildly elevated pulmonary artery systolic  pressure. The estimated right ventricular systolic pressure is 15.1 mmHg.   7. The aortic valve is tricuspid. Aortic valve regurgitation is mild. No  aortic stenosis is present.   Comparison(s): No prior Echocardiogram.   Conclusion(s)/Recommendation(s): Emergent finding: Patient sent to ED for  stat imaging. Discussed with Patient, CT surgery, ED receiving team.      Cardiac CT: Date: 11/05/21 Results: 1.  Mild CAD in proximal-mid RCA and mid RCA, CADRADS = 2.   2. Coronary calcium score is 193 , which places the patient in the 77th percentile for age and sex matched control.   3. Normal coronary origins with right dominance.   4. Ascending aortic aneurysm with ascending aorta Type A dissection, visualized previously on CTA aorta exam 11/04/21 and described in radiologist's dictation. Aorta measures 40 mm at sinus of Valsalva and 49 mm at mid ascending aorta   5.  Moderate circumferential pericardial effusion.       Risk Assessment/Calculations/Metrics:    CHA2DS2-VASc Score = 4   This indicates a 4.8% annual risk of stroke. The patient's score is based upon: CHF History: 0 HTN History: 1 Diabetes History: 0 Stroke History: 0 Vascular Disease History: 1 Age Score: 1 Gender Score: 1             ASSESSMENT & PLAN:   No problem-specific Assessment & Plan notes found for this encounter.    Moderate MR on echo [facility] 11/17/22-see above-needs repeat  echo spring 2024 -will order. Chronic DOE unchanged. Reassurance given   PAF on Eliquis EKG 11/23/22 sinus bradycardia 58/m, no recent palpitations. Amio stopped b/c of thyroid complications.no recurrent afib  CAD mild on cardiac CT 2022- currently M-S chest pain releived with pressing on it. Continue with lidocaine patch/cream  01/28/2022:  stenting of the right axillosubclavian artery after open aortic dissection repair with cardiopulmonary bypass into the R axillary artery.   11/06/2021 (Dr. Charlies Constable): Stenting R axillosubclavian artery with bare metal stents                Dispo:  No follow-ups on file.   Medication Adjustments/Labs and Tests Ordered: Current medicines are reviewed at length with the patient today.  Concerns regarding medicines are outlined above.  Tests Ordered: Orders Placed This Encounter  Procedures   ECHOCARDIOGRAM COMPLETE   Medication Changes: No orders of the defined types were placed in this encounter.  Signed, Ermalinda Barrios, PA-C  12/02/2022 12:17 PM    [address], [place], [place]  [address] Phone: [phone]; Fax: [phone]

## 2022-11-22 ENCOUNTER — Encounter (HOSPITAL_COMMUNITY): Payer: Self-pay

## 2022-11-22 ENCOUNTER — Other Ambulatory Visit: Payer: Self-pay

## 2022-11-22 ENCOUNTER — Emergency Department (HOSPITAL_COMMUNITY): Payer: Medicare Other

## 2022-11-22 ENCOUNTER — Emergency Department (HOSPITAL_COMMUNITY)
Admission: EM | Admit: 2022-11-22 | Discharge: 2022-11-22 | Disposition: A | Discharge status: Home / Self Care | Payer: Medicare Other | Attending: Student | Admitting: Student

## 2022-11-22 DIAGNOSIS — Z79899 Other long term (current) drug therapy: Secondary | ICD-10-CM | POA: Diagnosis not present

## 2022-11-22 DIAGNOSIS — R079 Chest pain, unspecified: Secondary | ICD-10-CM | POA: Diagnosis not present

## 2022-11-22 DIAGNOSIS — R001 Bradycardia, unspecified: Secondary | ICD-10-CM | POA: Diagnosis not present

## 2022-11-22 DIAGNOSIS — I10 Essential (primary) hypertension: Secondary | ICD-10-CM | POA: Insufficient documentation

## 2022-11-22 DIAGNOSIS — M25512 Pain in left shoulder: Secondary | ICD-10-CM

## 2022-11-22 DIAGNOSIS — E039 Hypothyroidism, unspecified: Secondary | ICD-10-CM | POA: Diagnosis not present

## 2022-11-22 DIAGNOSIS — Z7901 Long term (current) use of anticoagulants: Secondary | ICD-10-CM | POA: Diagnosis not present

## 2022-11-22 DIAGNOSIS — F17210 Nicotine dependence, cigarettes, uncomplicated: Secondary | ICD-10-CM | POA: Insufficient documentation

## 2022-11-22 DIAGNOSIS — I480 Paroxysmal atrial fibrillation: Secondary | ICD-10-CM | POA: Insufficient documentation

## 2022-11-22 DIAGNOSIS — R0689 Other abnormalities of breathing: Secondary | ICD-10-CM | POA: Diagnosis not present

## 2022-11-22 DIAGNOSIS — R0789 Other chest pain: Secondary | ICD-10-CM | POA: Diagnosis not present

## 2022-11-22 LAB — BASIC METABOLIC PANEL
Anion gap: 5 (ref 5–15)
BUN: 13 mg/dL (ref 8–23)
CO2: 28 mmol/L (ref 22–32)
Calcium: 9 mg/dL (ref 8.9–10.3)
Chloride: 105 mmol/L (ref 98–111)
Creatinine, Ser: 0.6 mg/dL (ref 0.44–1.00)
GFR, Estimated: >60 mL/min (ref >60)
Glucose, Bld: 93 mg/dL (ref 70–99)
Potassium: 3.8 mmol/L (ref 3.5–5.1)
Sodium: 138 mmol/L (ref 135–145)

## 2022-11-22 LAB — CBC
HCT: 31.9 % — ABNORMAL LOW (ref 36.0–46.0)
Hemoglobin: 10.9 g/dL — ABNORMAL LOW (ref 12.0–15.0)
MCH: 32.3 pg (ref 26.0–34.0)
MCHC: 34.2 g/dL (ref 30.0–36.0)
MCV: 94.7 fL (ref 80.0–100.0)
Platelets: 161 10*3/uL (ref 150–400)
RBC: 3.37 MIL/uL — ABNORMAL LOW (ref 3.87–5.11)
RDW: 14.1 % (ref 11.5–15.5)
WBC: 6.7 10*3/uL (ref 4.0–10.5)
nRBC: 0 % (ref 0.0–0.2)

## 2022-11-22 LAB — TROPONIN I (HIGH SENSITIVITY)
Troponin I (High Sensitivity): 4 ng/L (ref <18)
Troponin I (High Sensitivity): 4 ng/L (ref <18)

## 2022-11-22 MED ORDER — ACETAMINOPHEN 500 MG PO TABS
1000.0000 mg | ORAL_TABLET | Freq: Once | ORAL | Status: AC
Start: 2022-11-22 — End: 2022-11-22
  Administered 2022-11-22: 1000 mg via ORAL
  Filled 2022-11-22: qty 2

## 2022-11-22 MED ORDER — LIDOCAINE 5 % EX PTCH: 1.0000 | MEDICATED_PATCH | CUTANEOUS | 0 refills | Status: DC

## 2022-11-22 MED ORDER — LIDOCAINE 5 % EX PTCH
1.0000 | MEDICATED_PATCH | CUTANEOUS | Status: DC
  Administered 2022-11-22: 1 via TRANSDERMAL
  Filled 2022-11-22: qty 1

## 2022-11-22 NOTE — ED Notes (Signed)
Patient called for vitals x1 

## 2022-11-22 NOTE — ED Provider Notes (Signed)
[facility] EMERGENCY DEPARTMENT Provider Note  CSN: [id number] Arrival date & time: 11/22/22 5456  Chief Complaint(s) Chest Pain  HPI Kristine Davidson is a 71 y.o. female with PMH anxiety, HTN type a aortic dissection status post surgical repair who presents emergency department for evaluation of shoulder pain.  Chief complaint states patient had chest pain but on my evaluation, patient states that she awoke from sleep this morning with severe left shoulder pain.  She states her pain has improved with no intervention since this morning and is currently a 4 out of 10.  She denies associated shortness of breath, diaphoresis, nausea, vomiting, numbness, tingling, weakness or other systemic symptoms.  In regards to her aortic dissection, she recently had follow-up CT angiogram and echocardiogram performed on 11/17/2022 with stable findings and no involvement of the subclavian vessels on the left.   Past Medical History Past Medical History:  Diagnosis Date   Allergy    seasonal allergies   Anemia    hx of    Anxiety    on meds   Aortic atherosclerosis (HCC)    Arthritis    generalized   B12 deficiency 03/31/2012   Depression    on meds--hx of   Diverticular disease    Emphysema of lung ([place])    HSV-2 (herpes simplex virus 2) infection    Hyperlipidemia    diet controlled   Hypertension    Mitral valve prolapse    Osteoporosis 01/14/2018   DEXA scan at [place] 01/14/18 T score -2.8 at Rt femur neck and L1-L4 AP spine   Substance abuse ([place])    Thoracic aortic ectasia (HCC)    Thyroid disease    on meds   Tobacco dependence    Vitamin D deficiency    Patient Active Problem List   Diagnosis Date Noted   Pericardial effusion 03/19/2022   Therapeutic drug monitoring 12/01/2021   Dissection of aorta ([place]) 11/04/2021   Paroxysmal atrial fibrillation ([place]) 10/27/2021   Secondary hypercoagulable state ([place]) 10/27/2021   Mitral valve prolapse 07/23/2020    Osteoporosis 01/14/2018   Anemia 10/29/2016   Depression with anxiety 02/28/2015   Hypothyroidism 05/17/2014   HSV-2 (herpes simplex virus 2) infection 03/31/2012   Vitamin D deficiency disease 03/31/2012   H/O diverticulitis of colon 02/17/2012   Hypothyroid 02/17/2012   Home Medication(s) Prior to Admission medications   Medication Sig Start Date End Date Taking? Authorizing Provider  lidocaine (LIDODERM) 5 % Place 1 patch onto the skin daily. Remove & Discard patch within 12 hours or as directed by MD 11/22/22  Yes Edwena Mayorga, MD  acetaminophen (TYLENOL) 325 MG tablet as needed for pain.    [provider]  buPROPion (WELLBUTRIN XL) 150 MG 24 hr tablet Take by mouth daily.    [provider]  ELIQUIS 2.5 MG TABS tablet Take 2.5 mg by mouth 2 (two) times daily. 11/13/21   [provider]  levothyroxine (SYNTHROID) 50 MCG tablet TAKE 1 TABLET (50 MCG TOTAL) BY MOUTH DAILY. 12/07/19   Wendie Agreste, MD  metoprolol tartrate (LOPRESSOR) 25 MG tablet Take 12.5 mg by mouth daily. 11/13/21   [provider]  PARoxetine (PAXIL) 30 MG tablet Take by mouth daily.    [provider]  polyethylene glycol (MIRALAX / GLYCOLAX) 17 g packet at bedtime.    [provider]  rosuvastatin (CRESTOR) 20 MG tablet Take 20 mg by mouth daily. 08/01/22   [provider]  Sennosides (  SENOKOT PO) every morning.    [provider]  traZODone (DESYREL) 50 MG tablet Take 1-2 tablets (50-100 mg total) by mouth at bedtime. 12/07/19   Wendie Agreste, MD  valACYclovir (VALTREX) 500 MG tablet Take 1 tablet (500 mg total) by mouth at bedtime. 11/05/21   Erick Colace, NP                                                                                                                                    Past Surgical History Past Surgical History:  Procedure Laterality Date   COLONOSCOPY     D and C     TONSILLECTOMY     VARICOSE VEIN  SURGERY  2000   Vascular Surgery [place] upper extremity arterial duplex right       Family History Family History  Problem Relation Age of Onset   Stroke Mother    Macular degeneration Mother    Breast cancer Mother 42   Parkinson's disease Father    Parkinson's disease Sister    Diabetes Sister    Colon polyps Neg Hx    Colon cancer Neg Hx    Esophageal cancer Neg Hx    Stomach cancer Neg Hx    Rectal cancer Neg Hx     Social History Social History   Tobacco Use   Smoking status: Every Day    Packs/day: 0.50    Years: 43.00    Total pack years: 21.50    Types: Cigarettes   Smokeless tobacco: Never   Tobacco comments:    3 cigarettes daily 10/27/2021  Vaping Use   Vaping Use: Never used  Substance Use Topics   Alcohol use: No    Alcohol/week: 0.0 standard drinks of alcohol   Drug use: No   Allergies Quinolones, Codeine, and Nickel  Review of Systems Review of Systems  Musculoskeletal:  Positive for arthralgias.    Physical Exam Vital Signs  I have reviewed the triage vital signs BP (!) 146/61 (BP Location: Left Arm)   Pulse 60   Temp 98 F (36.7 C) (Oral)   Resp 17   Ht '5\' 5"'$  (1.651 m)   Wt 54 kg   SpO2 95%   BMI 19.80 kg/m   Physical Exam Vitals and nursing note reviewed.  Constitutional:      General: She is not in acute distress.    Appearance: She is well-developed.  HENT:     Head: Normocephalic and atraumatic.  Eyes:     Conjunctiva/sclera: Conjunctivae normal.  Cardiovascular:     Rate and Rhythm: Normal rate and regular rhythm.     Heart sounds: No murmur heard. Pulmonary:     Effort: Pulmonary effort is normal. No respiratory distress.     Breath sounds: Normal breath sounds.  Abdominal:     Palpations: Abdomen is soft.     Tenderness: There is no abdominal tenderness.  Musculoskeletal:  General: No swelling.     Cervical back: Neck supple.     Comments: Left shoulder tenderness  Skin:    General: Skin is warm and dry.      Capillary Refill: Capillary refill takes less than 2 seconds.  Neurological:     Mental Status: She is alert.  Psychiatric:        Mood and Affect: Mood normal.     ED Results and Treatments Labs (all labs ordered are listed, but only abnormal results are displayed) Labs Reviewed  CBC - Abnormal; Notable for the following components:      Result Value   RBC 3.37 (*)    Hemoglobin 10.9 (*)    HCT 31.9 (*)    All other components within normal limits  BASIC METABOLIC PANEL  TROPONIN I (HIGH SENSITIVITY)  TROPONIN I (HIGH SENSITIVITY)                                                                                                                          Radiology DG Shoulder Left  Result Date: 11/22/2022 CLINICAL DATA:  71 year old female  with left shoulder pain. EXAM: LEFT SHOULDER - 2+ VIEW COMPARISON:  Chest radiographs 0746 hours today, 04/19/2022, 09/13/2009. Also chest CTA 11/04/2021. FINDINGS: No glenohumeral joint dislocation. Bone mineralization is within normal limits for age. Proximal left humerus appears stable since the CTA chest last year, within normal limits. Left clavicle and scapula appear intact. Joint spaces are normal for age. Negative visible left ribs and lung. Sternotomy is new from last year. IMPRESSION: No acute osseous abnormality identified about the left shoulder. Electronically Signed   By: Genevie Ann M.D.   On: 11/22/2022 13:21   DG Chest 2 View  Result Date: 11/22/2022 CLINICAL DATA:  Left chest pain since this morning. EXAM: CHEST - 2 VIEW COMPARISON:  April 19, 2022 FINDINGS: The heart size and mediastinal contours are stable. Stable vascular stents noted over the right subclavian region. Both lungs are clear. The visualized skeletal structures are unremarkable. IMPRESSION: No active cardiopulmonary disease. Electronically Signed   By: Abelardo Diesel M.D.   On: 11/22/2022 08:16    Pertinent labs & imaging results that were available during my care of the  patient were reviewed by me and considered in my medical decision making (see MDM for details).  Medications Ordered in ED Medications  lidocaine (LIDODERM) 5 % 1 patch (1 patch Transdermal Patch Applied 11/22/22 1251)  acetaminophen (TYLENOL) tablet 1,000 mg (1,000 mg Oral Given 11/22/22 1250)  Procedures Procedures  (including critical care time)  Medical Decision Making / ED Course   This patient presents to the ED for concern of shoulder pain, this involves an extensive number of treatment options, and is a complaint that carries with it a high risk of complications and morbidity.  The differential diagnosis includes musculoskeletal strain, referred pain from pneumonia or intrathoracic pathology, rotator cuff injury, expansion of aortic dissection  MDM: Patient seen emergency room for evaluation of shoulder pain.  Physical exam with reproducible tenderness over the shoulder and over the supraspinatus on the left.  Pain worse with range of motion of the arm.  Pulses equal bilaterally.  Laboratory evaluation with a hemoglobin of 10.9 but is otherwise unremarkable including high-sensitivity troponin and delta troponin.  Chest x-ray unremarkable, shoulder x-ray unremarkable.  Patient given lidocaine patch and acetaminophen and on reevaluation symptoms have improved.  Patient I had a very long discussion about advanced imaging in the setting of her known aortic dissection and although it is unlikely that her aortic dissection has progressed in the last 5 days since it was last evaluated by CT, I cannot definitively rule out this pathology and to miss this would be life-threatening.  We use shared decision making and the patient is requesting to follow-up with her cardiologist whom she has follow-up with on Thursday, 11/26/2022 and opt for return precautions and close  outpatient follow-up.  I do not think this is unreasonable given the patient does not have any chest pain or neurologic findings but she was given very strict return precautions of which she voiced understanding and patient will return to the emergency department for emergent CT vessel imaging of the chest if symptoms were to worsen or change.  Patient then discharged with outpatient follow-up.   Additional history obtained:  -External records from outside source obtained and reviewed including: Chart review including previous notes, labs, imaging, consultation notes   Lab Tests: -I ordered, reviewed, and interpreted labs.   The pertinent results include:   Labs Reviewed  CBC - Abnormal; Notable for the following components:      Result Value   RBC 3.37 (*)    Hemoglobin 10.9 (*)    HCT 31.9 (*)    All other components within normal limits  BASIC METABOLIC PANEL  TROPONIN I (HIGH SENSITIVITY)  TROPONIN I (HIGH SENSITIVITY)      Imaging Studies ordered: I ordered imaging studies including chest x-ray, shoulder x-ray I independently visualized and interpreted imaging. I agree with the radiologist interpretation   Medicines ordered and prescription drug management: Meds ordered this encounter  Medications   lidocaine (LIDODERM) 5 % 1 patch   acetaminophen (TYLENOL) tablet 1,000 mg   lidocaine (LIDODERM) 5 %    Sig: Place 1 patch onto the skin daily. Remove & Discard patch within 12 hours or as directed by MD    Dispense:  30 patch    Refill:  0    -I have reviewed the patients home medicines and have made adjustments as needed  Critical interventions none    Cardiac Monitoring: The patient was maintained on a cardiac monitor.  I personally viewed and interpreted the cardiac monitored which showed an underlying rhythm of: NSR  Social Determinants of Health:  Factors impacting patients care include: none   Reevaluation: After the interventions noted above, I  reevaluated the patient and found that they have :improved  Co morbidities that complicate the patient evaluation  Past Medical History:  Diagnosis Date  Allergy    seasonal allergies   Anemia    hx of    Anxiety    on meds   Aortic atherosclerosis (HCC)    Arthritis    generalized   B12 deficiency 03/31/2012   Depression    on meds--hx of   Diverticular disease    Emphysema of lung (HCC)    HSV-2 (herpes simplex virus 2) infection    Hyperlipidemia    diet controlled   Hypertension    Mitral valve prolapse    Osteoporosis 01/14/2018   DEXA scan at [place] 01/14/18 T score -2.8 at Rt femur neck and L1-L4 AP spine   Substance abuse ([place])    Thoracic aortic ectasia (HCC)    Thyroid disease    on meds   Tobacco dependence    Vitamin D deficiency       Dispostion: I considered admission for this patient, but we have shared decision making and patient requested close outpatient follow-up with return precautions and thus we did not pursue hospital admission.  Patient then discharged with outpatient follow-up     Final Clinical Impression(s) / ED Diagnoses Final diagnoses:  Acute pain of left shoulder     '@PCDICTATION'$ @    Kenae Lindquist, Debe Coder, MD 11/22/22 1752

## 2022-11-22 NOTE — ED Triage Notes (Signed)
Reports awoke from her sleep with left sided chest pain going to shoulder.  Described as full ness.  Deneis dizziness nausea or sob.  Patient takes eliquis so no asa given with ems.  150/70  HR57 96% CBG 113   18g LAC

## 2022-11-22 NOTE — Discharge Instructions (Addendum)
You were seen in the emergency room for evaluation of shoulder pain.  Your workup was reassuringly negative in the emergency department today, but we had a long discussion about your history of aortic dissection and both decided that the overall likelihood of your aortic dissection worsening since your last scan 4 days ago and causing your shoulder pain is low enough that we will not obtain advanced CT imaging today in the ER.  This is certainly a risk and if you are symptoms were to change including any numbness, tingling, weakness, chest pain or shortness of breath you must return to the emergency department immediately for an emergency dissection scan.  Please call your follow-up physician on Monday for urgent follow-up

## 2022-11-24 ENCOUNTER — Other Ambulatory Visit: Payer: Self-pay | Admitting: Internal Medicine

## 2022-11-24 DIAGNOSIS — Z1231 Encounter for screening mammogram for malignant neoplasm of breast: Secondary | ICD-10-CM

## 2022-11-25 ENCOUNTER — Ambulatory Visit (INDEPENDENT_AMBULATORY_CARE_PROVIDER_SITE_OTHER): Payer: Medicare Other

## 2022-11-25 VITALS — BP 130/78 | HR 63 | Temp 97.3°F | Resp 20 | Ht 66.0 in | Wt 125.6 lb

## 2022-11-25 DIAGNOSIS — M810 Age-related osteoporosis without current pathological fracture: Secondary | ICD-10-CM | POA: Diagnosis not present

## 2022-11-25 MED ORDER — DENOSUMAB 60 MG/ML ~~LOC~~ SOSY
60.0000 mg | PREFILLED_SYRINGE | Freq: Once | SUBCUTANEOUS | Status: AC
  Administered 2022-11-25: 60 mg via SUBCUTANEOUS
  Filled 2022-11-25: qty 1

## 2022-11-25 NOTE — Progress Notes (Signed)
Diagnosis: Osteoporosis  Provider:  Marshell Garfinkel MD  Procedure: Injection  Prolia (Denosumab), Dose: 60 mg, Site: subcutaneous, Number of injections: 1   Discharge: Condition: Good, Destination: Home . AVS provided to patient.   Performed by:  Koren Shiver, RN

## 2022-12-02 ENCOUNTER — Encounter: Payer: Self-pay | Admitting: Physician Assistant

## 2022-12-02 ENCOUNTER — Ambulatory Visit: Payer: Medicare Other | Attending: Physician Assistant | Admitting: Physician Assistant

## 2022-12-02 VITALS — BP 120/66 | HR 54 | Ht 66.0 in | Wt 125.2 lb

## 2022-12-02 DIAGNOSIS — I7101 Dissection of ascending aorta: Secondary | ICD-10-CM | POA: Diagnosis not present

## 2022-12-02 DIAGNOSIS — I340 Nonrheumatic mitral (valve) insufficiency: Secondary | ICD-10-CM | POA: Diagnosis not present

## 2022-12-02 DIAGNOSIS — I2510 Atherosclerotic heart disease of native coronary artery without angina pectoris: Secondary | ICD-10-CM | POA: Insufficient documentation

## 2022-12-02 DIAGNOSIS — I7779 Dissection of other artery: Secondary | ICD-10-CM | POA: Diagnosis not present

## 2022-12-02 DIAGNOSIS — I480 Paroxysmal atrial fibrillation: Secondary | ICD-10-CM | POA: Insufficient documentation

## 2022-12-02 NOTE — Patient Instructions (Signed)
Medication Instructions:  Your physician recommends that you continue on your current medications as directed. Please refer to the Current Medication list given to you today.  *If you need a refill on your cardiac medications before your next appointment, please call your pharmacy*   Lab Work: None ordered   If you have labs (blood work) drawn today and your tests are completely normal, you will receive your results only by: [place] (if you have MyChart) OR A paper copy in the mail If you have any lab test that is abnormal or we need to change your treatment, we will call you to review the results.   Testing/Procedures: Your physician has requested that you have an echocardiogram in May 2024. Echocardiography is a painless test that uses sound waves to create images of your heart. It provides your doctor with information about the size and shape of your heart and how well your heart's chambers and valves are working. This procedure takes approximately one hour. There are no restrictions for this procedure. Please do NOT wear cologne, perfume, aftershave, or lotions (deodorant is allowed). Please arrive 15 minutes prior to your appointment time.    Follow-Up: Follow up with Werner Lean, MD after your echocardiogram   Other Instructions   Important Information About Sugar

## 2022-12-23 ENCOUNTER — Encounter: Payer: Self-pay | Admitting: Internal Medicine

## 2022-12-28 ENCOUNTER — Telehealth: Payer: Self-pay

## 2022-12-28 NOTE — Telephone Encounter (Signed)
Left a message to call back.  Left a detailed message informing that MD is requesting images of pt Echo performed at [facility] on 11/17/22.  MD received report but is requesting images.

## 2023-01-20 ENCOUNTER — Ambulatory Visit
Admission: RE | Admit: 2023-01-20 | Discharge: 2023-01-20 | Disposition: A | Discharge status: Home / Self Care | Payer: Medicare Other | Source: Ambulatory Visit | Attending: Internal Medicine | Admitting: Internal Medicine

## 2023-01-20 DIAGNOSIS — Z1231 Encounter for screening mammogram for malignant neoplasm of breast: Secondary | ICD-10-CM | POA: Diagnosis not present

## 2023-02-10 IMAGING — CT CT ABD-PELV W/O CM
1 of 2 series · 14 of 32 positions shown, 19 images · non-contrast
Comparison: None

CLINICAL DATA: Stone protocol. LEFT flank pain. Microscopic
hematuria

EXAM:
CT ABDOMEN AND PELVIS WITHOUT CONTRAST
TECHNIQUE: Multidetector CT imaging of the abdomen and pelvis was performed
following the standard protocol without IV contrast.

[Series 2: renal standard/full · axial · 0.66mm/px · z∈[-572,-222]mm · 14 of 80 slices shown, 19 images]
[im 5/80  soft-tissue]
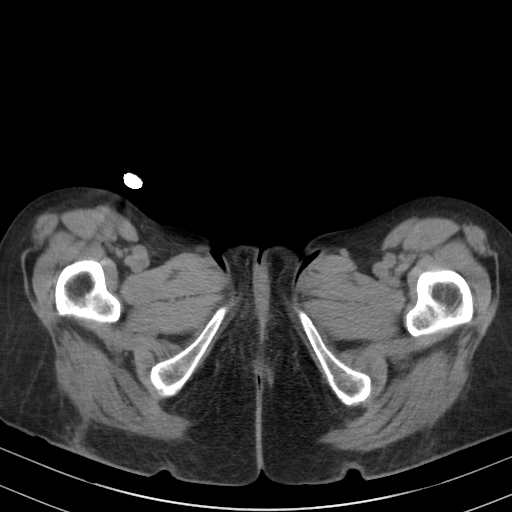
[im 5/80  bone]
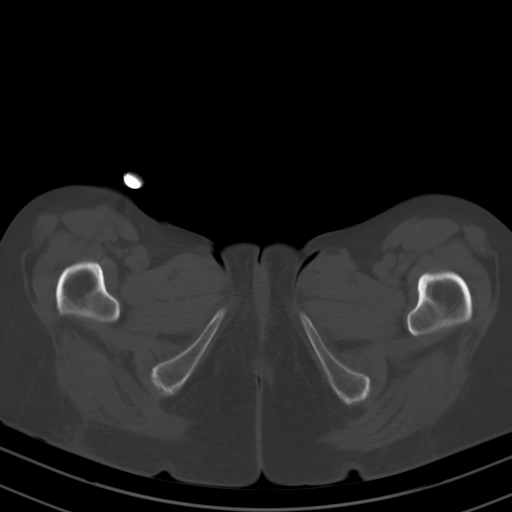
[im 10/80  soft-tissue]
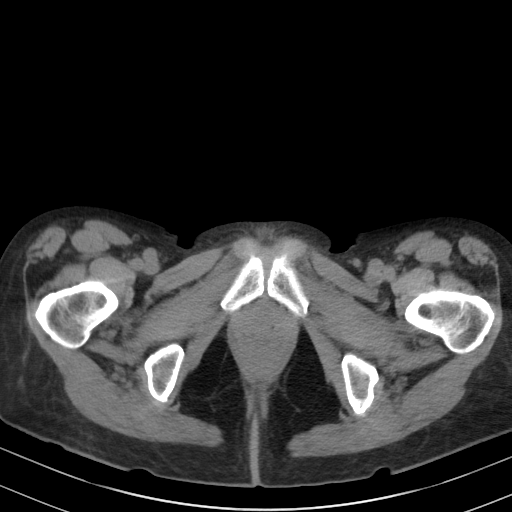
[im 15/80  soft-tissue]
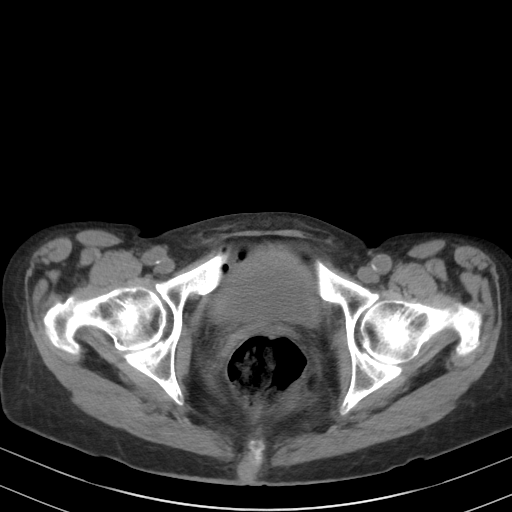
[im 25/80  soft-tissue]
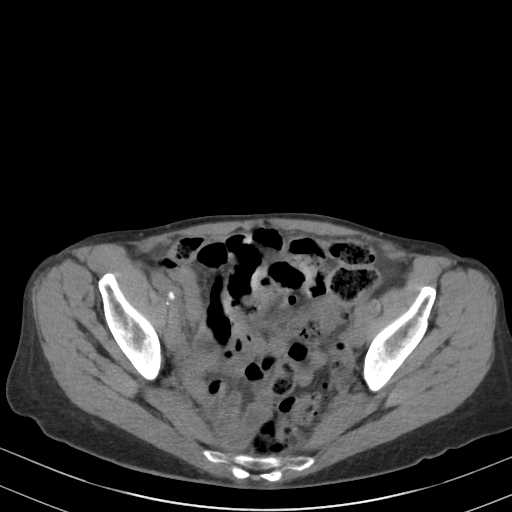
[im 30/80  soft-tissue]
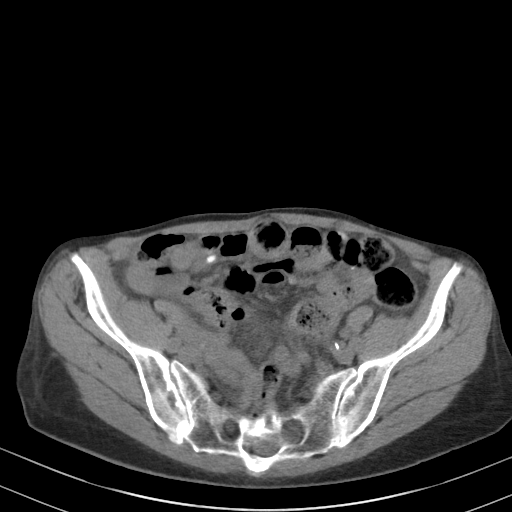
[im 35/80  soft-tissue]
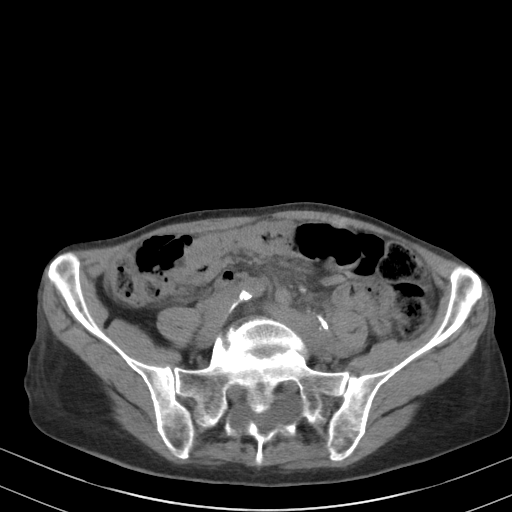
[im 40/80  soft-tissue]
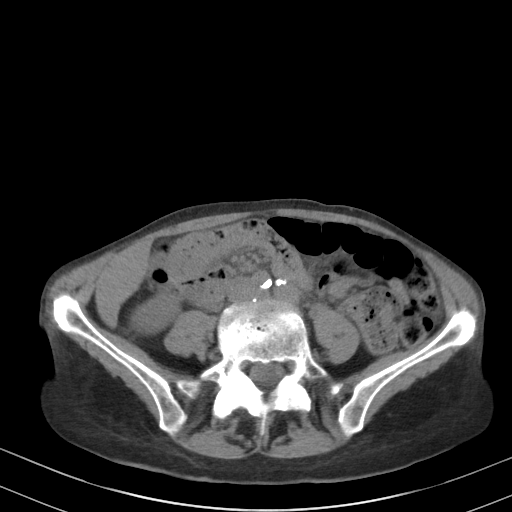
[im 45/80  soft-tissue]
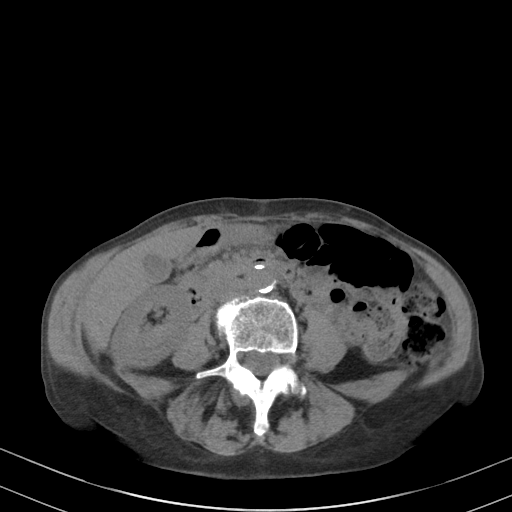
[im 50/80  soft-tissue]
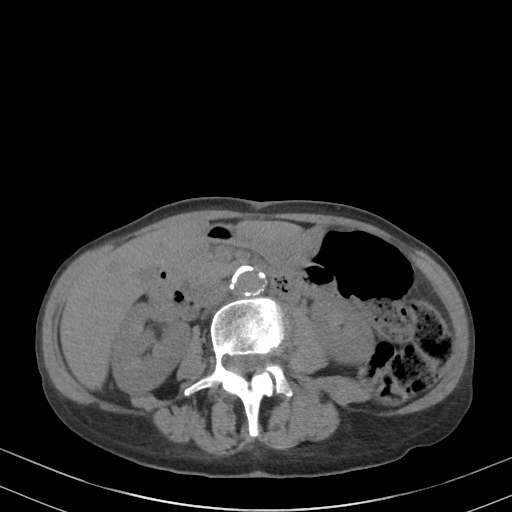
[im 50/80  bone]
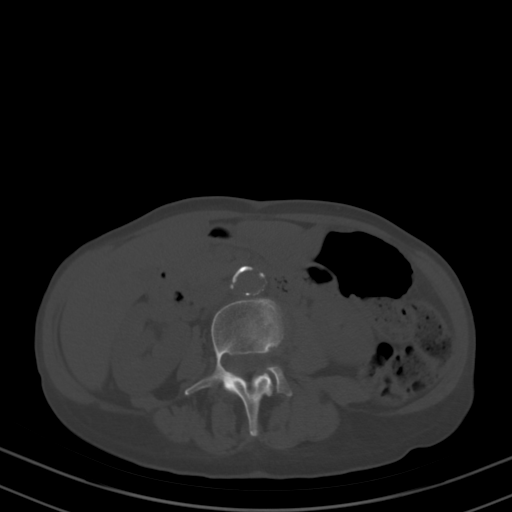
[im 55/80  soft-tissue]
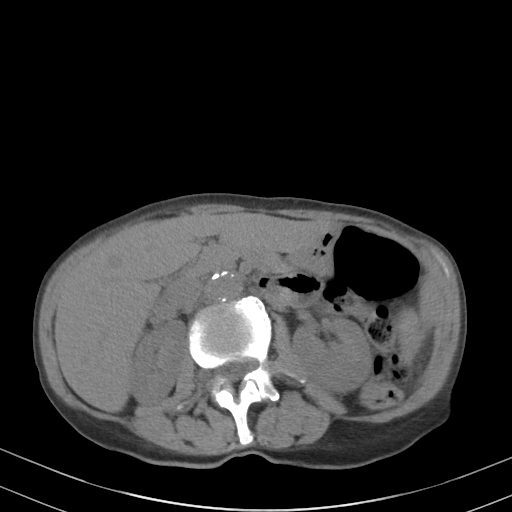
[im 60/80  lung]
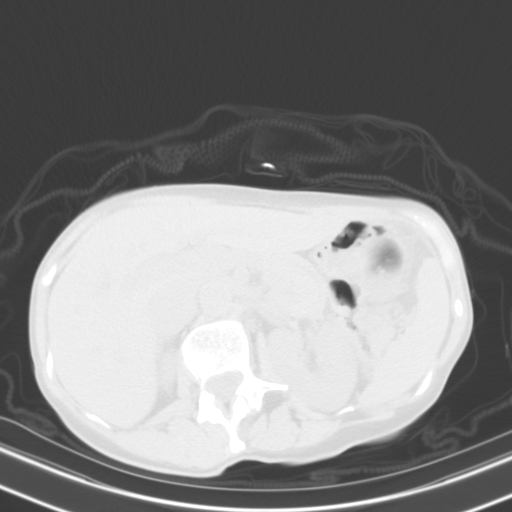
[im 65/80  soft-tissue]
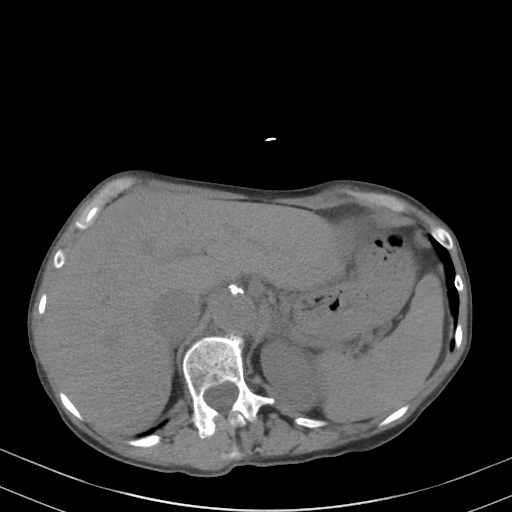
[im 65/80  lung]
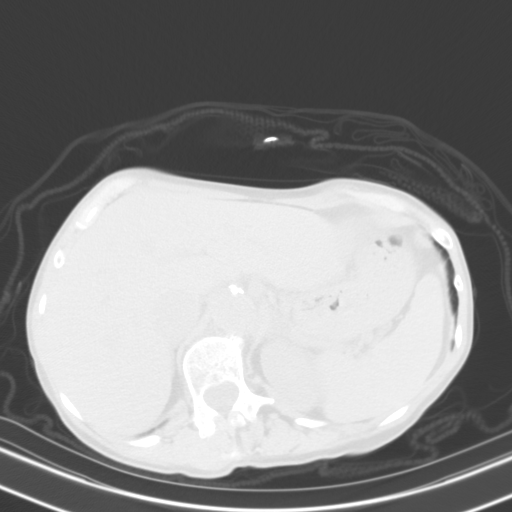
[im 70/80  soft-tissue]
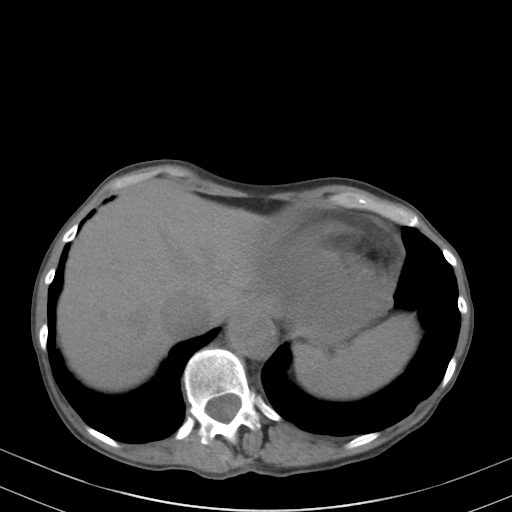
[im 70/80  lung]
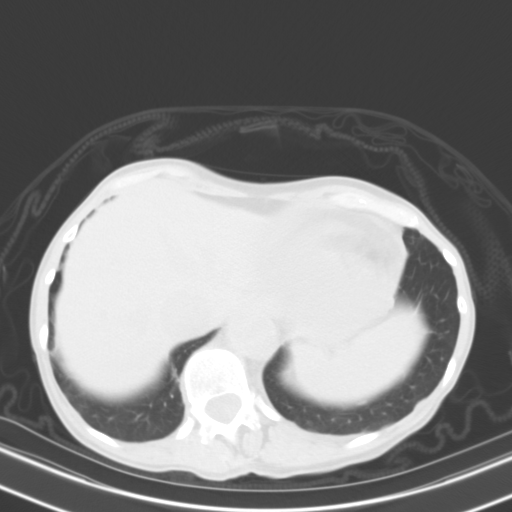
[im 75/80  soft-tissue]
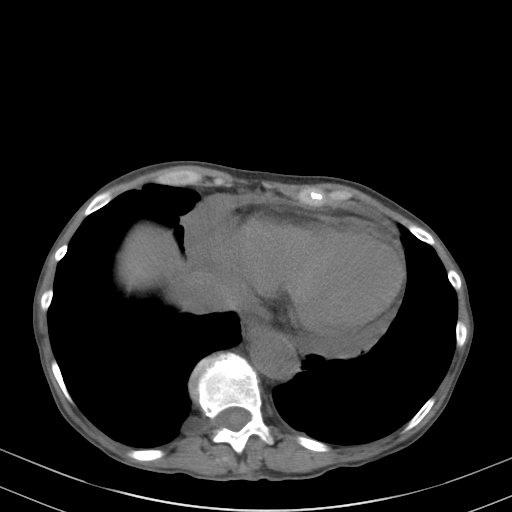
[im 75/80  lung]
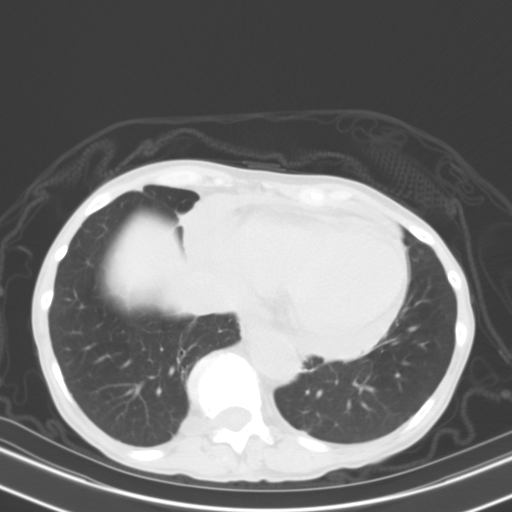

[14 of 32 positions shown; findings below may reference images not displayed]

FINDINGS: Lower chest: Lung bases are clear.  Trace pericardial effusion.

Hepatobiliary: No focal hepatic lesion. No biliary duct dilatation.
Common bile duct is normal.

Pancreas: Pancreas is normal. No ductal dilatation. No pancreatic
inflammation.

Spleen: Normal spleen

Adrenals/urinary tract: Adrenal glands, kidneys, ureters and bladder
normal. No nephrolithiasis or ureterolithiasis.

Stomach/Bowel: Stomach, small bowel, appendix, and cecum are normal.
The colon and rectosigmoid colon are normal.

Vascular/Lymphatic: Abdominal aorta is normal caliber with
atherosclerotic calcification. There is no retroperitoneal or
periportal lymphadenopathy. No pelvic lymphadenopathy.

Reproductive: Uterus and adnexa unremarkable.

Other: No free fluid.

Musculoskeletal: Tarlov cyst at the sacral level. No aggressive
osseous lesion. Scoliosis of the lumbar thoracic spine
IMPRESSION: 1. No explanation for flank pain. No ureterolithiasis or
nephrolithiasis.
2.  Thoracolumbar scoliosis.
3. Trace pericardial effusion.
4.  Aortic Atherosclerosis ([id number]).

## 2023-04-09 ENCOUNTER — Ambulatory Visit
Admission: RE | Admit: 2023-04-09 | Discharge: 2023-04-09 | Disposition: A | Discharge status: Home / Self Care | Payer: Medicare Other | Source: Ambulatory Visit | Attending: Internal Medicine | Admitting: Internal Medicine

## 2023-04-09 DIAGNOSIS — M810 Age-related osteoporosis without current pathological fracture: Secondary | ICD-10-CM | POA: Diagnosis not present

## 2023-04-09 DIAGNOSIS — M8588 Other specified disorders of bone density and structure, other site: Secondary | ICD-10-CM | POA: Diagnosis not present

## 2023-04-09 DIAGNOSIS — Z780 Asymptomatic menopausal state: Secondary | ICD-10-CM | POA: Diagnosis not present

## 2023-04-20 DIAGNOSIS — R7989 Other specified abnormal findings of blood chemistry: Secondary | ICD-10-CM | POA: Diagnosis not present

## 2023-04-20 DIAGNOSIS — M810 Age-related osteoporosis without current pathological fracture: Secondary | ICD-10-CM | POA: Diagnosis not present

## 2023-04-20 DIAGNOSIS — D649 Anemia, unspecified: Secondary | ICD-10-CM | POA: Diagnosis not present

## 2023-04-20 DIAGNOSIS — E039 Hypothyroidism, unspecified: Secondary | ICD-10-CM | POA: Diagnosis not present

## 2023-04-29 ENCOUNTER — Other Ambulatory Visit: Payer: Self-pay | Admitting: Internal Medicine

## 2023-04-29 DIAGNOSIS — Z87891 Personal history of nicotine dependence: Secondary | ICD-10-CM

## 2023-05-03 ENCOUNTER — Ambulatory Visit (HOSPITAL_COMMUNITY): Payer: Medicare Other

## 2023-05-07 ENCOUNTER — Ambulatory Visit: Payer: Medicare Other | Admitting: Internal Medicine

## 2023-05-07 ENCOUNTER — Ambulatory Visit (HOSPITAL_COMMUNITY): Payer: Medicare Other | Attending: Physician Assistant

## 2023-05-07 DIAGNOSIS — I340 Nonrheumatic mitral (valve) insufficiency: Secondary | ICD-10-CM | POA: Insufficient documentation

## 2023-05-07 LAB — ECHOCARDIOGRAM COMPLETE
Area-P 1/2: 3.65 cm2
S' Lateral: 2.9 cm

## 2023-05-27 ENCOUNTER — Ambulatory Visit: Payer: Medicare Other

## 2023-06-02 ENCOUNTER — Telehealth: Payer: Self-pay | Admitting: Pharmacy Technician

## 2023-06-02 ENCOUNTER — Ambulatory Visit
Admission: RE | Admit: 2023-06-02 | Discharge: 2023-06-02 | Disposition: A | Discharge status: Home / Self Care | Payer: Medicare Other | Source: Ambulatory Visit | Attending: Internal Medicine | Admitting: Internal Medicine

## 2023-06-02 DIAGNOSIS — Z87891 Personal history of nicotine dependence: Secondary | ICD-10-CM | POA: Diagnosis not present

## 2023-06-02 NOTE — Telephone Encounter (Signed)
Auth Submission: NO AUTH NEEDED Site of care: Site of care: [facility] Payer: medicare a/b & [employer] [place] Medication & CPT/J Code(s) submitted: Prolia (Denosumab) [id number] Route of submission (phone, fax, portal):  Phone # Fax # Auth type: Buy/Bill Units/visits requested: x2 Reference number:  Approval from: 06/02/23 to 12/21/23

## 2023-06-03 ENCOUNTER — Encounter: Payer: Self-pay | Admitting: Internal Medicine

## 2023-06-03 ENCOUNTER — Ambulatory Visit (INDEPENDENT_AMBULATORY_CARE_PROVIDER_SITE_OTHER): Payer: Medicare Other

## 2023-06-03 ENCOUNTER — Ambulatory Visit: Payer: Medicare Other | Attending: Internal Medicine | Admitting: Internal Medicine

## 2023-06-03 VITALS — BP 118/66 | HR 57 | Ht 66.0 in | Wt 121.2 lb

## 2023-06-03 VITALS — BP 129/60 | HR 51 | Temp 98.2°F | Ht 66.0 in | Wt 122.6 lb

## 2023-06-03 DIAGNOSIS — I2510 Atherosclerotic heart disease of native coronary artery without angina pectoris: Secondary | ICD-10-CM | POA: Insufficient documentation

## 2023-06-03 DIAGNOSIS — I7101 Dissection of ascending aorta: Secondary | ICD-10-CM | POA: Diagnosis not present

## 2023-06-03 DIAGNOSIS — R942 Abnormal results of pulmonary function studies: Secondary | ICD-10-CM

## 2023-06-03 DIAGNOSIS — T466X5A Adverse effect of antihyperlipidemic and antiarteriosclerotic drugs, initial encounter: Secondary | ICD-10-CM | POA: Insufficient documentation

## 2023-06-03 DIAGNOSIS — D509 Iron deficiency anemia, unspecified: Secondary | ICD-10-CM | POA: Insufficient documentation

## 2023-06-03 DIAGNOSIS — R0609 Other forms of dyspnea: Secondary | ICD-10-CM | POA: Insufficient documentation

## 2023-06-03 DIAGNOSIS — M791 Myalgia, unspecified site: Secondary | ICD-10-CM | POA: Diagnosis not present

## 2023-06-03 DIAGNOSIS — M810 Age-related osteoporosis without current pathological fracture: Secondary | ICD-10-CM | POA: Diagnosis not present

## 2023-06-03 DIAGNOSIS — E54 Ascorbic acid deficiency: Secondary | ICD-10-CM | POA: Insufficient documentation

## 2023-06-03 MED ORDER — DENOSUMAB 60 MG/ML ~~LOC~~ SOSY
60.0000 mg | PREFILLED_SYRINGE | Freq: Once | SUBCUTANEOUS | Status: AC
  Administered 2023-06-03: 60 mg via SUBCUTANEOUS

## 2023-06-03 NOTE — Progress Notes (Signed)
Diagnosis: Osteoporosis  Provider:  Chilton Greathouse MD  Procedure: Injection  Prolia (Denosumab), Dose: 60 mg, Site: subcutaneous, Number of injections: 1  Post Care: Patient declined observation  Discharge: Condition: Good, Destination: Home . AVS Provided  Performed by:  Loney Hering, LPN

## 2023-06-03 NOTE — Progress Notes (Signed)
Cardiology Office Note:    Date:  06/03/2023   ID:  Kristine Davidson, DOB 1951-07-19, MRN [id number]  PCP:  Melida Quitter, MD   [facility] Providers Cardiologist:  Christell Constant, MD     Referring MD: Melida Quitter, MD   CC: Follow up aortic dissection  History of Present Illness:    Kristine Davidson is a 72 y.o. female with a hx of PAF; CAC and Aortic Atherosclerosis, MVP, and HLD.  Seen incidentally for the an aortic dissection.  S/p Surgery (at [facility]) complicated by R arm compartment syndrome s/p surgery and BMS to subclavian (R pox, R distal R axillary).   Since stopping amiodarone has called in with palpitations and increase fatigue. 2023: In routine follow up.  Saw [facility]: we performed subclavian imaging for this patient.  2024: Query of worsening MR-> Echo done here showed myxomatous mitral valve with mild bileaflet prolapse and mild MR  Patient notes that she is doing really well.   Since last visit notes that she feels short of breath and has muscle aches . There are no interval hospital/ED visit.    She has been on the rosuvastatin 20 mg since prior to seeing [place]. Improves with Tylenol.  No chest pain or pressure . No palpitations or syncope. Notes DOE that is new.    Past Medical History:  Diagnosis Date   Allergy    seasonal allergies   Anemia    hx of    Anxiety    on meds   Aortic atherosclerosis (HCC)    Arthritis    generalized   B12 deficiency 03/31/2012   Depression    on meds--hx of   Diverticular disease    Emphysema of lung (HCC)    HSV-2 (herpes simplex virus 2) infection    Hyperlipidemia    diet controlled   Hypertension    Mitral valve prolapse    Osteoporosis 01/14/2018   [facility] T score -2.8 at Rt femur neck and L1-L4 AP spine   Substance abuse (HCC)    Thoracic aortic ectasia (HCC)    Thyroid disease    on meds   Tobacco dependence    Vitamin D deficiency     Past Surgical History:   Procedure Laterality Date   COLONOSCOPY     D and C     TONSILLECTOMY     VARICOSE VEIN SURGERY  2000   Vascular Surgery [place] upper extremity arterial duplex right        Current Medications: Current Meds  Medication Sig   acetaminophen (TYLENOL) 325 MG tablet as needed for pain.   buPROPion (WELLBUTRIN XL) 300 MG 24 hr tablet Take 300 mg by mouth daily.   ELIQUIS 2.5 MG TABS tablet Take 2.5 mg by mouth 2 (two) times daily.   levothyroxine (SYNTHROID) 50 MCG tablet TAKE 1 TABLET (50 MCG TOTAL) BY MOUTH DAILY.   MAGNESIUM CITRATE PO Take 250 mg by mouth daily at 6 (six) AM.   metoprolol tartrate (LOPRESSOR) 25 MG tablet Take 12.5 mg by mouth 2 (two) times daily.   Multiple Vitamin (MULTIVITAMIN ADULT PO) Take 1 capsule by mouth daily.   PARoxetine (PAXIL) 30 MG tablet Take by mouth daily.   polyethylene glycol (MIRALAX / GLYCOLAX) 17 g packet at bedtime.   traZODone (DESYREL) 50 MG tablet Take 1-2 tablets (50-100 mg total) by mouth at bedtime.   valACYclovir (VALTREX) 500 MG tablet Take 1 tablet (500  mg total) by mouth at bedtime.   [DISCONTINUED] rosuvastatin (CRESTOR) 20 MG tablet Take 20 mg by mouth daily.     Allergies:   Quinolones, Codeine, and Nickel   Social History   Socioeconomic History   Marital status: Divorced    Spouse name: Not on file   Number of children: 0   Years of education: Not on file   Highest education level: Professional school degree (e.g., MD, DDS, DVM, JD)  Occupational History   Occupation: Magazine features editor: [employer]    Comment: Retired  Tobacco Use   Smoking status: Every Day    Packs/day: 0.50    Years: 43.00    Additional pack years: 0.00    Total pack years: 21.50    Types: Cigarettes   Smokeless tobacco: Never   Tobacco comments:    3 cigarettes daily 10/27/2021  Vaping Use   Vaping Use: Never used  Substance and Sexual Activity   Alcohol use: No    Alcohol/week: 0.0 standard drinks of alcohol    Drug use: No   Sexual activity: Never  Other Topics Concern   Not on file  Social History Narrative   Lives alone.    Social Determinants of Health   Financial Resource Strain: Low Risk  (11/02/2017)   Overall Financial Resource Strain (CARDIA)    Difficulty of Paying Living Expenses: Not very hard  Food Insecurity: No Food Insecurity (11/02/2017)   Hunger Vital Sign    Worried About Running Out of Food in the Last Year: Never true    Ran Out of Food in the Last Year: Never true  Transportation Needs: No Transportation Needs (11/02/2017)   PRAPARE - Administrator, Civil Service (Medical): No    Lack of Transportation (Non-Medical): No  Physical Activity: Inactive (11/02/2017)   Exercise Vital Sign    Days of Exercise per Week: 0 days    Minutes of Exercise per Session: 0 min  Stress: No Stress Concern Present (11/02/2017)   [employer] of Occupational Health - Occupational Stress Questionnaire    Feeling of Stress : Not at all  Social Connections: Somewhat Isolated (11/02/2017)   Social Connection and Isolation Panel [NHANES]    Frequency of Communication with Friends and Family: More than three times a week    Frequency of Social Gatherings with Friends and Family: More than three times a week    Attends Religious Services: Never    Database administrator or Organizations: Yes    Attends Engineer, structural: More than 4 times per year    Marital Status: Divorced    Social: Comes with family that sometimes come, I met her in the echo lab, has a dog  Family History: The patient's family history includes Breast cancer (age of onset: 25) in her mother; Diabetes in her sister; Macular degeneration in her mother; Parkinson's disease in her father and sister; Stroke in her mother. There is no history of Colon polyps, Colon cancer, Esophageal cancer, Stomach cancer, or Rectal cancer.  ROS:   Please see the history of present illness.     All other  systems reviewed and are negative.  EKGs/Labs/Other Studies Reviewed:    The following studies were reviewed today:  EKG:   11/2022: SR QTc WNL 03/19/22:  Sinus bradycardia 56  Cardiac Studies & Procedures       ECHOCARDIOGRAM  ECHOCARDIOGRAM COMPLETE 05/07/2023  Narrative ECHOCARDIOGRAM REPORT    Patient Name:  Randall An Date of Exam: 05/07/2023 Medical Rec #:  [id number]        Height:       66.0 in Accession #:    [id number]       Weight:       125.2 lb Date of Birth:  Feb 03, 1951         BSA:          1.639 m Patient Age:    72 years         BP:           120/66 mmHg Patient Gender: F                HR:           53 bpm. Exam Location:  [address]  Procedure: 2D Echo, Cardiac Doppler and Color Doppler  Indications:    I34.0 MR  History:        Patient has prior history of Echocardiogram examinations, most recent 04/10/2022. H/o dissection of ascending aorta s/p replacement 11/07/21), MR, Arrythmias:Atrial Fibrillation; Risk Factors:Hypertension, Dyslipidemia and Current Smoker.  Sonographer:    Samule Ohm RDCS Referring Phys: [id number] MICHELE M LENZE  IMPRESSIONS   1. Left ventricular ejection fraction, by estimation, is 55 to 60%. The left ventricle has normal function. The left ventricle has no regional wall motion abnormalities. Left ventricular diastolic parameters are consistent with Grade II diastolic dysfunction (pseudonormalization). 2. Right ventricular systolic function is normal. The right ventricular size is normal. There is normal pulmonary artery systolic pressure. The estimated right ventricular systolic pressure is 26.5 mmHg. 3. Left atrial size was mildly dilated. 4. Right atrial size was mildly dilated. 5. The mitral valve is abnormal with mild bileaflet prolapse. Mild mitral valve regurgitation. No evidence of mitral stenosis. 6. The aortic valve is tricuspid. Aortic valve regurgitation is trivial. No aortic stenosis is present. 7.  Aortic dilatation noted and root/ascending aorta has been repaired/replaced. There is borderline dilatation of the aortic root, measuring 37 mm. 8. The inferior vena cava is dilated in size with >50% respiratory variability, suggesting right atrial pressure of 8 mmHg.  FINDINGS Left Ventricle: Left ventricular ejection fraction, by estimation, is 55 to 60%. The left ventricle has normal function. The left ventricle has no regional wall motion abnormalities. The left ventricular internal cavity size was normal in size. There is no left ventricular hypertrophy. Left ventricular diastolic parameters are consistent with Grade II diastolic dysfunction (pseudonormalization).  Right Ventricle: The right ventricular size is normal. No increase in right ventricular wall thickness. Right ventricular systolic function is normal. There is normal pulmonary artery systolic pressure. The tricuspid regurgitant velocity is 2.15 m/s, and with an assumed right atrial pressure of 8 mmHg, the estimated right ventricular systolic pressure is 26.5 mmHg.  Left Atrium: Left atrial size was mildly dilated.  Right Atrium: Right atrial size was mildly dilated.  Pericardium: There is no evidence of pericardial effusion.  Mitral Valve: The mitral valve is abnormal. Mild mitral valve regurgitation. No evidence of mitral valve stenosis.  Tricuspid Valve: The tricuspid valve is normal in structure. Tricuspid valve regurgitation is trivial.  Aortic Valve: The aortic valve is tricuspid. Aortic valve regurgitation is trivial. No aortic stenosis is present.  Pulmonic Valve: The pulmonic valve was normal in structure. Pulmonic valve regurgitation is trivial.  Aorta: Aortic dilatation noted and the aortic root/ascending aorta has been repaired/replaced. There is borderline dilatation of the aortic root, measuring 37 mm.  Venous: The inferior vena  cava is dilated in size with greater than 50% respiratory variability, suggesting  right atrial pressure of 8 mmHg.  IAS/Shunts: No atrial level shunt detected by color flow Doppler.   LEFT VENTRICLE PLAX 2D LVIDd:         4.60 cm   Diastology LVIDs:         2.90 cm   LV e' medial:    6.42 cm/s LV PW:         0.90 cm   LV E/e' medial:  21.3 LV IVS:        0.90 cm   LV e' lateral:   13.70 cm/s LVOT diam:     1.90 cm   LV E/e' lateral: 10.0 LV SV:         58 LV SV Index:   36 LVOT Area:     2.84 cm   RIGHT VENTRICLE             IVC RV S prime:     14.80 cm/s  IVC diam: 2.50 cm TAPSE (M-mode): 1.7 cm RVSP:           26.5 mmHg  LEFT ATRIUM           Index        RIGHT ATRIUM           Index LA diam:      4.60 cm 2.81 cm/m   RA Pressure: 8.00 mmHg LA Vol (A2C): 23.8 ml 14.52 ml/m  RA Area:     19.80 cm LA Vol (A4C): 49.5 ml 30.20 ml/m  RA Volume:   61.90 ml  37.77 ml/m AORTIC VALVE LVOT Vmax:   92.60 cm/s LVOT Vmean:  60.200 cm/s LVOT VTI:    0.206 m  AORTA Ao Root diam: 3.70 cm Ao Asc diam:  3.30 cm  MITRAL VALVE                TRICUSPID VALVE MV Area (PHT): 3.65 cm     TR Peak grad:   18.5 mmHg MV Decel Time: 208 msec     TR Vmax:        215.00 cm/s MV E velocity: 137.00 cm/s  Estimated RAP:  8.00 mmHg MV A velocity: 43.50 cm/s   RVSP:           26.5 mmHg MV E/A ratio:  3.15 SHUNTS Systemic VTI:  0.21 m Systemic Diam: 1.90 cm  Dalton McleanMD Electronically signed by Wilfred Lacy Signature Date/Time: 05/07/2023/5:23:46 PM    Final    MONITORS  LONG TERM MONITOR (3-14 DAYS) 04/14/2022  Narrative  Patient had a minimum heart rate of 49 bpm, maximum heart rate of 162 bpm, and average heart rate of 60 bpm.  Predominant underlying rhythm was sinus rhythm.  Several short runs of SVT, lasting 13 seconds at largest with rate of 162 at fastest.  Isolated PACs were rare (<1.0%).  Isolated PVCs were occaisonal (3.4%).  Triggered and diary events associated with sinus rhythm, sinus bradycardia, PACs, and PVCs.  Occasional,  occasionally symptomatic PVCs.   CT SCANS  CT CORONARY MORPH W/CTA COR W/SCORE 11/05/2021  Addendum 11/05/2021  4:44 PM ADDENDUM REPORT: 11/05/2021 16:42  REVISION TO IMPRESSIONS: REVISION TO IMPRESSIONS IMPRESSION:  1. Mild CAD in proximal-mid RCA and mid RCA, CADRADS = 2.  2. Coronary calcium score is 193 , which places the patient in the 77th percentile for age and sex matched control.  3. Normal coronary origins with right dominance.  4. Ascending  aortic aneurysm with ascending aorta Type A dissection, visualized previously on CTA aorta exam 11/04/21 and described in radiologist's dictation. Aorta measures 40 mm at sinus of Valsalva and 49 mm at mid ascending aorta  5.  Moderate circumferential pericardial effusion.   Electronically Signed By: Weston Brass M.D. On: 11/05/2021 16:42  Addendum 11/05/2021  2:49 PM ADDENDUM REPORT: 11/05/2021 14:46  HISTORY: Acute aortic syndrome (AAS) suspected  EXAM: Cardiac/Coronary  CT  TECHNIQUE: The patient was scanned on a [employer].  PROTOCOL: A 120 kV prospective scan was triggered in the descending thoracic aorta at 111 HU's. Axial non-contrast 3 mm slices were carried out through the heart. The data set was analyzed on a dedicated work station and scored using the Agatston method. Gantry rotation speed was 250 msecs and collimation was .6 mm. Beta blockade and 0.8 mg of sl NTG was given. The 3D data set was reconstructed in 5% intervals of the 35-75 % of the R-R cycle. Systolic and diastolic phases were analyzed on a dedicated work station using MPR, MIP and VRT modes. The patient received 95mL OMNIPAQUE IOHEXOL 350 MG/ML SOLN contrast.  FINDINGS: Image quality: Adequate. Suboptimal contrast bolus timing, contrast bolus is in the false lumen due to misplaced bolus tracker.  Noise artifact is: Limited.  Coronary calcium score is 193, which places the patient in the 77th percentile for age and sex  matched control.  Coronary arteries: Normal coronary origins.  Right dominance.  Right Coronary Artery: Mild mixed atherosclerotic plaque in the proximal to mid and mid RCA, 25-49%  Left Main Coronary Artery: Short left main coronary artery with no detectable plaque or stenosis.  Left Anterior Descending Coronary Artery: Minimal mixed atherosclerotic plaque in the proximal LAD, <25% stenosis. Patent diagonals without detectable plaque or stenosis.  Left Circumflex Artery: Minimal mixed atherosclerotic plaque in the proximal L circumflex artery, <25% stenosis. Small caliber distal L circumflex artery. OM1 is normal caliber with no detectable plaque or stenosis.  Aorta: Type A ascending aorta dissection and ascending aorta aneurysm, previously assessed on CTA CAP dissection study 11/04/21. Dissection flap originates immediately above the origin of the RCA but does not involve the RCA origin.  Sinus of Valsalva:  R-L: 40 mm  L-Non: 40 mm  R-Non: 38 mm  Mid ascending aorta (at PA bifurcation): The dissected mid ascending aorta measures 49 mm.  Aortic Valve: No calcifications. Not involved in dissection flap, normal appearance and cusp excursion.  Other findings:  Normal pulmonary vein drainage into the left atrium.  Normal left atrial appendage without thrombus.  Normal size of the pulmonary artery.  IMPRESSION: 1. Mild CAD in proximal-mid RCA and mid RCA, CADRADS = 2.  2. Coronary calcium score is 193, which places the patient in the 77th percentile for age and sex matched control.  3. Normal coronary origins with right dominance.  4. Ascending aortic aneurysm with ascending aorta Type A dissection, visualized previously on CTA aorta exam 11/04/21 and described in radiologist's dictation. Aorta measures 40 mm at sinus of Valsalva and 49 mm at mid ascending aorta.   Electronically Signed By: Weston Brass M.D. On: 11/05/2021  14:46  Narrative EXAM: OVER-READ INTERPRETATION  CT CHEST  The following report is an over-read performed by radiologist Dr. Trudie Reed of [facility] on 11/05/2021. This over-read does not include interpretation of cardiac or coronary anatomy or pathology. The coronary calcium score/coronary CTA interpretation by the cardiologist is attached.  COMPARISON:  None.  FINDINGS: Type  A thoracic aortic aneurysm/dissection again noted, currently measuring 4.9 cm in diameter. This is incompletely imaged. The false lumen and dissection flap extend back to the aortic root, directly adjacent to the ostium of the right coronary artery which remains patent at this time. Scattered areas of linear scarring are noted in the lungs, most evident in the right middle lobe. Within the visualized portions of the thorax there are no suspicious appearing pulmonary nodules or masses, there is no acute consolidative airspace disease, no pleural effusions, no pneumothorax and no lymphadenopathy. Visualized portions of the upper abdomen are unremarkable. There are no aggressive appearing lytic or blastic lesions noted in the visualized portions of the skeleton.  IMPRESSION: 1. Known type A thoracic aortic aneurysm/dissection redemonstrated, as above.  Electronically Signed: By: Trudie Reed M.D. On: 11/05/2021 10:32           Recent Labs: 11/22/2022: BUN 13; Creatinine, Ser 0.60; Hemoglobin 10.9; Platelets 161; Potassium 3.8; Sodium 138  Recent Lipid Panel    Component Value Date/Time   CHOL 96 11/05/2021 0610   CHOL 177 11/10/2017 1042   TRIG 61 11/05/2021 0610   HDL 42 11/05/2021 0610   HDL 79 11/10/2017 1042   CHOLHDL 2.3 11/05/2021 0610   VLDL 12 11/05/2021 0610   LDLCALC 42 11/05/2021 0610   LDLCALC 77 11/10/2017 1042    Risk Assessment/Calculations:    CHA2DS2-VASc Score = 4   This indicates a 4.8% annual risk of stroke. The patient's score is based  upon: CHF History: 0 HTN History: 1 Diabetes History: 0 Stroke History: 0 Vascular Disease History: 1 Age Score: 1 Gender Score: 1           Physical Exam:    VS:  BP 118/66   Pulse (!) 57   Ht 5\' 6"  (1.676 m)   Wt 121 lb 3.2 oz (55 kg)   SpO2 96%   BMI 19.56 kg/m     Wt Readings from Last 3 Encounters:  06/03/23 121 lb 3.2 oz (55 kg)  12/02/22 125 lb 3.2 oz (56.8 kg)  11/25/22 125 lb 9.6 oz (57 kg)    Gen: no distress, thin female  Neck: No JVD, R subclavian bruit Cardiac: No Rubs or Gallops, systolic murmur, normal rhythm +2 radial pulses Respiratory: Clear to auscultation bilaterally, normal effort, normal  respiratory rate GI: Soft, nontender, non-distended  MS: No  edema;  moves all extremities Integument: Skin feels warm, has R arm scar from fasciotomy, and well heal scar from surgical interventions Neuro:  At time of evaluation, alert and oriented to person/place/time/situation  Psych: Normal affect, patient feels tired but well  ASSESSMENT:    1. DOE (dyspnea on exertion)   2. Barlow's disease   3. Iron deficiency anemia, unspecified iron deficiency anemia type   4. Coronary artery disease involving native coronary artery of native heart without angina pectoris   5. Myalgia due to statin   6. 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 We reviewed her CT from her dissection (MAD distance  6 mm) and the variance in her echo reads during emergency surgery vs during her follow up - if syncope or worsening SOB after PFTs will get CMR and PRN lasix if worsening swelling   Aortic dissection S/p subclavian and axillary PCI S/p Aortic Dissection - is smoke free - continue crestor LDL goal < 55 - quinolone allergy is listed - discussed family screening at prior visit  Former tobacco use DOE - will get PFTs  Time Spent Directly with Patient:   I have spent a total of 60 minutes with the patient reviewing notes, imaging, EKGs, labs and examining the patient as well as establishing an assessment and plan that was discussed personally with the patient.  > 50% of time was spent in direct patient care and reviewing imaging with patient.   One year with me      Medication Adjustments/Labs and Tests Ordered: Current medicines are reviewed at length with the patient today.  Concerns regarding medicines are outlined above.  Orders Placed This Encounter  Procedures   Pulmonary function test   No orders of the defined types were placed in this encounter.    Patient Instructions  Medication Instructions:  Your physician has recommended you make the following change in your medication:  STOP Rosuvastatin.  *If you need a refill on your cardiac medications before your next appointment, please call your pharmacy*   Lab Work: None.  If you have labs (blood work) drawn today and your tests are completely normal, you will receive your results only by: MyChart Message (if you have MyChart) OR A paper copy in the mail If you have any lab test that is abnormal or we need to change your treatment, we will call you to review the results.   Testing/Procedures:  Your physician has requested that you have a Pulmonary Function Test.  Patient Instructions for Pulmonary Function Test  Do not smoke within at least 1 hour before the test. Do not consume Caffeine 4 hours prior to the  test. Do not consume Alcohol within 4 hours prior to testing. Do not perform any Vigorous Exercise within 30 minutes before the test. Do not wear clothes that restrict the chest area or abdomen. Do not use albuterol or Xopenex 3 hours before the test or any other nebulizer medications or inhalers.    Follow-Up: At [facility], you and your health needs are our priority.  As part of our continuing mission to provide you with exceptional heart care, we have created designated Provider Care Teams.  These Care Teams include your primary Cardiologist (physician) and Advanced Practice Providers (APPs -  Physician Assistants and Nurse Practitioners) who all work together to provide you with the care you need, when you need it.  Your next appointment:   7 month(s)  Provider:   Christell Constant, MD    Signed, Christell Constant, MD  06/03/2023 3:14 PM     [facility]

## 2023-06-03 NOTE — Patient Instructions (Addendum)
Medication Instructions:  Your physician has recommended you make the following change in your medication:  STOP Rosuvastatin.  *If you need a refill on your cardiac medications before your next appointment, please call your pharmacy*   Lab Work: None.  If you have labs (blood work) drawn today and your tests are completely normal, you will receive your results only by: MyChart Message (if you have MyChart) OR A paper copy in the mail If you have any lab test that is abnormal or we need to change your treatment, we will call you to review the results.   Testing/Procedures:  Your physician has requested that you have a Pulmonary Function Test.  Patient Instructions for Pulmonary Function Test  Do not smoke within at least 1 hour before the test. Do not consume Caffeine 4 hours prior to the test. Do not consume Alcohol within 4 hours prior to testing. Do not perform any Vigorous Exercise within 30 minutes before the test. Do not wear clothes that restrict the chest area or abdomen. Do not use albuterol or Xopenex 3 hours before the test or any other nebulizer medications or inhalers.    Follow-Up: At [facility], you and your health needs are our priority.  As part of our continuing mission to provide you with exceptional heart care, we have created designated Provider Care Teams.  These Care Teams include your primary Cardiologist (physician) and Advanced Practice Providers (APPs -  Physician Assistants and Nurse Practitioners) who all work together to provide you with the care you need, when you need it.  Your next appointment:   7 month(s)  Provider:   Christell Constant, MD

## 2023-06-14 ENCOUNTER — Ambulatory Visit (HOSPITAL_COMMUNITY)
Admission: RE | Admit: 2023-06-14 | Discharge: 2023-06-14 | Disposition: A | Discharge status: Home / Self Care | Payer: Medicare Other | Source: Ambulatory Visit | Attending: Internal Medicine | Admitting: Internal Medicine

## 2023-06-14 DIAGNOSIS — R0609 Other forms of dyspnea: Secondary | ICD-10-CM | POA: Insufficient documentation

## 2023-06-14 LAB — PULMONARY FUNCTION TEST
DL/VA % pred: 80 %
DL/VA: 3.28 ml/min/mmHg/L
DLCO unc % pred: 73 %
DLCO unc: 14.9 ml/min/mmHg
FEF 25-75 Post: 1.67 L/sec
FEF 25-75 Pre: 1.16 L/sec
FEF2575-%Change-Post: 44 %
FEF2575-%Pred-Post: 92 %
FEF2575-%Pred-Pre: 64 %
FEV1-%Change-Post: 9 %
FEV1-%Pred-Post: 79 %
FEV1-%Pred-Pre: 73 %
FEV1-Post: 1.83 L
FEV1-Pre: 1.67 L
FEV1FVC-%Change-Post: 5 %
FEV1FVC-%Pred-Pre: 96 %
FEV6-%Change-Post: 2 %
FEV6-%Pred-Post: 82 %
FEV6-%Pred-Pre: 79 %
FEV6-Post: 2.38 L
FEV6-Pre: 2.31 L
FEV6FVC-%Change-Post: 0 %
FEV6FVC-%Pred-Post: 104 %
FEV6FVC-%Pred-Pre: 104 %
FVC-%Change-Post: 3 %
FVC-%Pred-Post: 79 %
FVC-%Pred-Pre: 76 %
FVC-Post: 2.41 L
FVC-Pre: 2.32 L
Post FEV1/FVC ratio: 76 %
Post FEV6/FVC ratio: 100 %
Pre FEV1/FVC ratio: 72 %
Pre FEV6/FVC Ratio: 100 %
RV % pred: 118 %
RV: 2.78 L
TLC % pred: 97 %
TLC: 5.16 L

## 2023-06-14 MED ORDER — ALBUTEROL SULFATE (2.5 MG/3ML) 0.083% IN NEBU
2.5000 mg | INHALATION_SOLUTION | Freq: Once | RESPIRATORY_TRACT | Status: AC
  Administered 2023-06-14: 2.5 mg via RESPIRATORY_TRACT

## 2023-06-21 NOTE — Addendum Note (Signed)
Addended by: Macie Burows on: 06/21/2023 11:52 AM   Modules accepted: Orders

## 2023-06-23 DIAGNOSIS — Z87891 Personal history of nicotine dependence: Secondary | ICD-10-CM | POA: Diagnosis not present

## 2023-06-23 DIAGNOSIS — R200 Anesthesia of skin: Secondary | ICD-10-CM | POA: Diagnosis not present

## 2023-06-23 DIAGNOSIS — I7779 Dissection of other artery: Secondary | ICD-10-CM | POA: Diagnosis not present

## 2023-06-24 ENCOUNTER — Encounter: Payer: Self-pay | Admitting: Internal Medicine

## 2023-06-28 DIAGNOSIS — R519 Headache, unspecified: Secondary | ICD-10-CM | POA: Diagnosis not present

## 2023-06-28 DIAGNOSIS — Z20822 Contact with and (suspected) exposure to covid-19: Secondary | ICD-10-CM | POA: Diagnosis not present

## 2023-06-28 DIAGNOSIS — B349 Viral infection, unspecified: Secondary | ICD-10-CM | POA: Diagnosis not present

## 2023-07-29 ENCOUNTER — Institutional Professional Consult (permissible substitution) (HOSPITAL_BASED_OUTPATIENT_CLINIC_OR_DEPARTMENT_OTHER): Payer: Medicare Other | Admitting: Pulmonary Disease

## 2023-07-29 ENCOUNTER — Encounter (HOSPITAL_BASED_OUTPATIENT_CLINIC_OR_DEPARTMENT_OTHER): Payer: Self-pay

## 2023-08-17 ENCOUNTER — Telehealth: Payer: Self-pay | Admitting: Internal Medicine

## 2023-08-17 DIAGNOSIS — I2510 Atherosclerotic heart disease of native coronary artery without angina pectoris: Secondary | ICD-10-CM

## 2023-08-17 MED ORDER — ROSUVASTATIN CALCIUM 10 MG PO TABS: 10.0000 mg | ORAL_TABLET | Freq: Every day | ORAL | 3 refills | Status: DC

## 2023-08-17 NOTE — Telephone Encounter (Signed)
Called pt reports is currently taking rosuvastatin.  Scheduled FLP for 09/27/23.

## 2023-08-17 NOTE — Telephone Encounter (Signed)
Patient is requesting to have fasting labs done, but does not currently have orders for this. Requesting call back to get this scheduled.

## 2023-09-09 DIAGNOSIS — Z23 Encounter for immunization: Secondary | ICD-10-CM | POA: Diagnosis not present

## 2023-09-17 ENCOUNTER — Institutional Professional Consult (permissible substitution) (HOSPITAL_BASED_OUTPATIENT_CLINIC_OR_DEPARTMENT_OTHER): Payer: Medicare Other | Admitting: Pulmonary Disease

## 2023-09-17 DIAGNOSIS — D72819 Decreased white blood cell count, unspecified: Secondary | ICD-10-CM | POA: Insufficient documentation

## 2023-09-27 ENCOUNTER — Ambulatory Visit: Payer: Medicare Other | Attending: Internal Medicine

## 2023-09-27 DIAGNOSIS — I2510 Atherosclerotic heart disease of native coronary artery without angina pectoris: Secondary | ICD-10-CM

## 2023-09-28 LAB — LIPID PANEL
Chol/HDL Ratio: 2.1 {ratio} (ref 0.0–4.4)
Cholesterol, Total: 147 mg/dL (ref 100–199)
HDL: 71 mg/dL (ref >39)
LDL Chol Calc (NIH): 62 mg/dL (ref 0–99)
Triglycerides: 68 mg/dL (ref 0–149)
VLDL Cholesterol Cal: 14 mg/dL (ref 5–40)

## 2023-09-28 LAB — ALT: ALT: 21 [IU]/L (ref 0–32)

## 2023-10-26 DIAGNOSIS — E039 Hypothyroidism, unspecified: Secondary | ICD-10-CM | POA: Diagnosis not present

## 2023-10-26 DIAGNOSIS — Z0000 Encounter for general adult medical examination without abnormal findings: Secondary | ICD-10-CM | POA: Diagnosis not present

## 2023-10-26 DIAGNOSIS — M810 Age-related osteoporosis without current pathological fracture: Secondary | ICD-10-CM | POA: Diagnosis not present

## 2023-10-29 ENCOUNTER — Telehealth: Payer: Self-pay

## 2023-10-29 ENCOUNTER — Other Ambulatory Visit: Payer: Self-pay

## 2023-10-29 NOTE — Telephone Encounter (Signed)
Auth Submission: NO AUTH NEEDED Site of care: Site of care: [facility] Payer: Medicare A/B with [employer] Medication & CPT/J Code(s) submitted: Prolia (Denosumab) [phone] Route of submission (phone, fax, portal):  Phone # Fax # Auth type: Buy/Bill PB Units/visits requested: 60mg  x 2 doses Reference number:  Approval from: 10/29/23 to 10/28/24

## 2023-11-16 DIAGNOSIS — Z95828 Presence of other vascular implants and grafts: Secondary | ICD-10-CM | POA: Diagnosis not present

## 2023-11-16 DIAGNOSIS — I7779 Dissection of other artery: Secondary | ICD-10-CM | POA: Diagnosis not present

## 2023-11-16 DIAGNOSIS — I71011 Dissection of aortic arch: Secondary | ICD-10-CM | POA: Diagnosis not present

## 2023-11-16 DIAGNOSIS — Z9889 Other specified postprocedural states: Secondary | ICD-10-CM | POA: Diagnosis not present

## 2023-11-16 DIAGNOSIS — I71019 Dissection of thoracic aorta, unspecified: Secondary | ICD-10-CM | POA: Diagnosis not present

## 2023-11-16 DIAGNOSIS — Z87891 Personal history of nicotine dependence: Secondary | ICD-10-CM | POA: Diagnosis not present

## 2023-11-16 DIAGNOSIS — Z8679 Personal history of other diseases of the circulatory system: Secondary | ICD-10-CM | POA: Diagnosis not present

## 2023-11-16 DIAGNOSIS — Z7901 Long term (current) use of anticoagulants: Secondary | ICD-10-CM | POA: Diagnosis not present

## 2023-11-17 ENCOUNTER — Encounter: Payer: Self-pay | Admitting: Internal Medicine

## 2023-11-25 ENCOUNTER — Other Ambulatory Visit (HOSPITAL_BASED_OUTPATIENT_CLINIC_OR_DEPARTMENT_OTHER): Payer: Self-pay

## 2023-11-25 ENCOUNTER — Ambulatory Visit (HOSPITAL_BASED_OUTPATIENT_CLINIC_OR_DEPARTMENT_OTHER): Payer: Medicare Other | Admitting: Pulmonary Disease

## 2023-11-25 ENCOUNTER — Encounter (HOSPITAL_BASED_OUTPATIENT_CLINIC_OR_DEPARTMENT_OTHER): Payer: Self-pay | Admitting: Pulmonary Disease

## 2023-11-25 ENCOUNTER — Encounter: Payer: Self-pay | Admitting: Internal Medicine

## 2023-11-25 VITALS — BP 96/60 | HR 58 | Resp 14 | Ht 66.0 in | Wt 130.8 lb

## 2023-11-25 DIAGNOSIS — J432 Centrilobular emphysema: Secondary | ICD-10-CM | POA: Insufficient documentation

## 2023-11-25 MED ORDER — BUDESONIDE-FORMOTEROL FUMARATE 80-4.5 MCG/ACT IN AERO: 2.0000 | INHALATION_SPRAY | Freq: Two times a day (BID) | RESPIRATORY_TRACT | 5 refills | Status: DC

## 2023-11-25 MED ORDER — BUDESONIDE-FORMOTEROL FUMARATE 80-4.5 MCG/ACT IN AERO
2.0000 | INHALATION_SPRAY | Freq: Two times a day (BID) | RESPIRATORY_TRACT | 0 refills | Status: AC
  Filled 2023-11-25: qty 10.2, 30d supply, fill #0

## 2023-11-25 NOTE — Progress Notes (Signed)
Subjective:   PATIENT ID: Kristine Davidson GENDER: female DOB: 15-May-1951, MRN: [id number]  Chief Complaint  Patient presents with   Consult    DOE\Abn PFT--Had open heart surgery 11/22 and hasn't recovered    Reason for Visit: New consult for COPD  Kristine Davidson is a 72 year old female former smoker with emphysema, HTN, atrial fibrillation, hx aortic dissection, HLD and depression who presents for evaluation for emphysema.   She reports shortness of breath that worsens with activity and walking uphills. Denies any recent respiratory illness. Denies coughing and wheezing. Has nasal congestion at night and uses generic flonase. Wears a mask in public. Walks dog daily for 20 min. No other exercise.  Social History: Former smoker. 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  Past Medical History:  Diagnosis Date   Allergy    seasonal allergies   Anemia    hx of    Anxiety    on meds   Aortic atherosclerosis (HCC)    Arthritis    generalized   B12 deficiency 03/31/2012   Depression    on meds--hx of   Diverticular disease    Emphysema of lung (HCC)    HSV-2 (herpes simplex virus 2) infection    Hyperlipidemia    diet controlled   Hypertension    Mitral valve prolapse    Osteoporosis 01/14/2018   [facility] T score -2.8 at Rt femur neck and L1-L4 AP spine   Substance abuse (HCC)    Thoracic aortic ectasia (HCC)    Thyroid disease    on meds   Tobacco dependence    Vitamin D deficiency      Family History  Problem Relation Age of Onset   Stroke Mother    Macular degeneration Mother    Breast cancer Mother 90   Parkinson's disease Father    Parkinson's disease Sister    Diabetes Sister    Colon polyps Neg Hx    Colon cancer Neg Hx    Esophageal cancer Neg Hx    Stomach cancer Neg Hx    Rectal cancer Neg Hx      Social History    Occupational History   Occupation: Magazine features editor: [employer]    Comment: Retired  Tobacco Use   Smoking status: Former    Current packs/day: 0.50    Average packs/day: 0.5 packs/day for 43.0 years (21.5 ttl pk-yrs)    Types: Cigarettes   Smokeless tobacco: Never   Tobacco comments:    3 cigarettes daily 10/27/2021  Vaping Use   Vaping status: Never Used  Substance and Sexual Activity   Alcohol use: No    Alcohol/week: 0.0 standard drinks of alcohol   Drug use: No   Sexual activity: Never    Allergies  Allergen Reactions   Quinolones Other (See Comments)    Fluoroquinolones should be avoided in aortic patients unless alternative therapy is not Davidson option. (Aortic Dissection)   Codeine Itching   Nickel Dermatitis and Itching     Outpatient Medications Prior to Visit  Medication Sig Dispense Refill   acetaminophen (TYLENOL) 325 MG tablet as needed for pain.     buPROPion (WELLBUTRIN XL) 300 MG 24 hr tablet Take 300 mg by mouth daily.     ELIQUIS 2.5 MG TABS tablet Take 2.5 mg by mouth 2 (two) times daily.  levothyroxine (SYNTHROID) 50 MCG tablet TAKE 1 TABLET (50 MCG TOTAL) BY MOUTH DAILY. 90 tablet 3   MAGNESIUM CITRATE PO Take 250 mg by mouth daily at 6 (six) AM.     metoprolol tartrate (LOPRESSOR) 25 MG tablet Take 12.5 mg by mouth 2 (two) times daily.     PARoxetine (PAXIL) 30 MG tablet Take by mouth daily.     polyethylene glycol (MIRALAX / GLYCOLAX) 17 g packet at bedtime.     rosuvastatin (CRESTOR) 20 MG tablet Take 20 mg by mouth daily.     traZODone (DESYREL) 50 MG tablet Take 1-2 tablets (50-100 mg total) by mouth at bedtime. 180 tablet 3   valACYclovir (VALTREX) 1000 MG tablet Take 500 mg by mouth daily.     No facility-administered medications prior to visit.    Review of Systems  Constitutional:  Negative for chills, diaphoresis, fever, malaise/fatigue and weight loss.  HENT:  Negative for congestion.   Respiratory:   Positive for shortness of breath. Negative for cough, hemoptysis, sputum production and wheezing.   Cardiovascular:  Negative for chest pain, palpitations and leg swelling.     Objective:   Vitals:   11/25/23 1502  BP: 96/60  Pulse: (!) 58  Resp: 14  SpO2: 98%  Weight: 130 lb 12.8 oz (59.3 kg)  Height: 5\' 6"  (1.676 m)   SpO2: 98 %  Physical Exam: General: Well-appearing, no acute distress HENT: [place], AT Eyes: EOMI, no scleral icterus Respiratory: Clear to auscultation bilaterally.  No crackles, wheezing or rales Cardiovascular: RRR, -M/R/G, no JVD Extremities:-Edema,-tenderness Neuro: AAO x4, CNII-XII grossly intact Psych: Normal mood, normal affect  Data Reviewed:  Imaging: CT Lung Screen 06/02/23 - Mild centrilobular emphysema. Biapical pleuroparenchymal scarring. RML scarring with mucoid impaction  PFT: 06/14/23 FVC 2.41 (79%) FEV1 1.83 (79%) Ratio 72  TLC 97% DLCO 73% Interpretation: No obstructive or restrictive lung defect. Mildly reduced gas exchange  Labs: CBC    Component Value Date/Time   WBC 6.7 11/22/2022 0710   RBC 3.37 (L) 11/22/2022 0710   HGB 10.9 (L) 11/22/2022 0710   HGB 13.1 12/07/2019 1629   HGB 11.4 (L) 04/28/2012 1528   HCT 31.9 (L) 11/22/2022 0710   HCT 37.6 12/07/2019 1629   HCT 32.3 (L) 04/28/2012 1528   PLT 161 11/22/2022 0710   PLT 191 12/07/2019 1629   MCV 94.7 11/22/2022 0710   MCV 98 (H) 12/07/2019 1629   MCV 95 04/28/2012 1528   MCH 32.3 11/22/2022 0710   MCHC 34.2 11/22/2022 0710   RDW 14.1 11/22/2022 0710   RDW 12.3 12/07/2019 1629   RDW 13.0 04/28/2012 1528   LYMPHSABS 1.3 11/04/2021 1212   LYMPHSABS 1.6 10/29/2018 1131   LYMPHSABS 1.3 04/28/2012 1528   MONOABS 0.6 11/04/2021 1212   EOSABS 0.0 11/04/2021 1212   EOSABS 0.0 10/29/2018 1131   EOSABS 0.1 04/28/2012 1528   BASOSABS 0.0 11/04/2021 1212   BASOSABS 0.0 10/29/2018 1131   BASOSABS 0.0 04/28/2012 1528        Assessment & Plan:   Discussion: 72 year old  female former smoker with emphysema, HTN, atrial fibrillation, hx aortic dissection, HLD and depression who presents for evaluation for emphysema. Shortness of breath with activity. Discussed clinical course and management of COPD including bronchodilator regimen, preventive care including vaccinations and action plan for exacerbation. Will trial inhalers   Emphysema --Reviewed PFTs and CT scan. Mild emphysema but no significant obstruction. Partial response to inhalers so will trial --START generic Symbicort 80-4.5  mcg TWO puffs in the morning and evening. Rinse mouth out after use. --Encourage regular aerobic exercise and upper body resistances. Encourage yoga   Health Maintenance Immunization History  Administered Date(s) Administered   Influenza, High Dose Seasonal PF 10/04/2018   Influenza, Seasonal, Injecte, Preservative Fre 11/24/2012   Influenza,inj,Quad PF,6+ Mos 12/04/2013, 11/01/2014, 09/30/2015, 10/29/2016, 11/02/2017   Influenza-Unspecified 09/06/2019, 11/03/2021   PFIZER(Purple Top)SARS-COV-2 Vaccination 02/10/2020, 03/05/2020   PPD Test 04/06/2012   Pneumococcal Conjugate-13 09/30/2015   Pneumococcal Polysaccharide-23 10/23/2010, 10/29/2016   Tdap 08/25/2013   CT Lung Screen - enrolled  No orders of the defined types were placed in this encounter.  Meds ordered this encounter  Medications   SYMBICORT 80-4.5 MCG/ACT inhaler    Sig: Inhale 2 puffs into the lungs in the morning and at bedtime.    Dispense:  10.2 g    Refill:  0    Generic   budesonide-formoterol (SYMBICORT) 80-4.5 MCG/ACT inhaler    Sig: Inhale 2 puffs into the lungs 2 (two) times daily.    Dispense:  1 each    Refill:  5    Return in about 3 months (around 02/23/2024).  I have spent a total time of 45-minutes on the day of the appointment reviewing prior documentation, coordinating care and discussing medical diagnosis and plan with the patient/family. Imaging, labs and tests included in this note  have been reviewed and interpreted independently by me.  Rakhi Romagnoli Mechele Collin, MD [facility] 11/25/2023 3:36 PM

## 2023-11-25 NOTE — Patient Instructions (Signed)
Emphysema --Reviewed PFTs and CT scan. Mild emphysema but no significant obstruction. Partial response to inhalers so will trial --START generic Symbicort 80-4.5 mcg TWO puffs in the morning and evening. Rinse mouth out after use. --Encourage regular aerobic exercise and upper body resistances. Encourage yoga

## 2023-12-03 ENCOUNTER — Ambulatory Visit: Payer: Medicare Other

## 2023-12-03 VITALS — BP 130/66 | HR 58 | Temp 98.0°F | Resp 18 | Ht 66.0 in | Wt 130.4 lb

## 2023-12-03 DIAGNOSIS — M810 Age-related osteoporosis without current pathological fracture: Secondary | ICD-10-CM | POA: Diagnosis not present

## 2023-12-03 MED ORDER — DENOSUMAB 60 MG/ML ~~LOC~~ SOSY
60.0000 mg | PREFILLED_SYRINGE | Freq: Once | SUBCUTANEOUS | Status: AC
  Administered 2023-12-03: 60 mg via SUBCUTANEOUS
  Filled 2023-12-03: qty 1

## 2023-12-03 NOTE — Progress Notes (Signed)
Diagnosis: Osteoporosis  Provider:  Chilton Greathouse MD  Procedure: Injection  Prolia (Denosumab), Dose: 60 mg, Site: subcutaneous, Number of injections: 1  Injection Site(s): Left arm  Post Care: Patient declined observation  Discharge: Condition: Good, Destination: Home . AVS Declined  Performed by:  Loney Hering, LPN

## 2024-01-11 ENCOUNTER — Encounter: Payer: Self-pay | Admitting: Internal Medicine

## 2024-01-17 ENCOUNTER — Telehealth: Payer: Self-pay | Admitting: *Deleted

## 2024-01-17 NOTE — Telephone Encounter (Signed)
Can we get a phone note started with clearance request for this patient? Will need request from dentist submitted as well. Tereso Newcomer, PA-C    01/17/2024 8:14 AM

## 2024-01-17 NOTE — Telephone Encounter (Signed)
I left a vm for the dental office that we will require clearance request to come from their office and not the pt. I have left on the vm needed information and our fax # [phone]. Once we have this from the DDS office it can be review by our preop APP.

## 2024-01-18 NOTE — Telephone Encounter (Signed)
Our office has attempted x 2 to reach Dr. Judy Pimple, DDS office in order to obtain the needed information for the preop APP to review fro clearance.

## 2024-01-18 NOTE — Telephone Encounter (Signed)
Dr. Gwynne Edinger office called back and updated our office the opt is not going to have the dental work with their office and is going to go to an Transport planner. I thanked Dr. Gwynne Edinger office for the update.   We will need to wait until we receive a clearance request from the oral surgeon before proceeding to preop.   I will remove from the preop call back pool at this time.

## 2024-02-17 ENCOUNTER — Ambulatory Visit (HOSPITAL_BASED_OUTPATIENT_CLINIC_OR_DEPARTMENT_OTHER): Payer: Medicare Other | Admitting: Pulmonary Disease

## 2024-02-17 ENCOUNTER — Encounter (HOSPITAL_BASED_OUTPATIENT_CLINIC_OR_DEPARTMENT_OTHER): Payer: Self-pay | Admitting: Pulmonary Disease

## 2024-02-17 VITALS — BP 124/80 | HR 62 | Ht 66.0 in | Wt 129.6 lb

## 2024-02-17 DIAGNOSIS — J432 Centrilobular emphysema: Secondary | ICD-10-CM | POA: Diagnosis not present

## 2024-02-17 DIAGNOSIS — Z87891 Personal history of nicotine dependence: Secondary | ICD-10-CM | POA: Diagnosis not present

## 2024-02-17 NOTE — Progress Notes (Signed)
 Subjective:   PATIENT ID: Kristine Davidson GENDER: female DOB: Sep 14, 1951, MRN: [id number]  Chief Complaint  Patient presents with   Follow-up    Emphysema    Reason for Visit: New consult for COPD  Ms. Kristine Davidson is a 73 year old female former smoker with emphysema, HTN, atrial fibrillation, hx aortic dissection, HLD and depression who presents for follow-up for emphysema.   Initial consult She reports shortness of breath that worsens with activity and walking uphills. Denies any recent respiratory illness. Denies coughing and wheezing. Has nasal congestion at night and uses generic flonase. Wears a mask in public. Walks dog daily for 20 min. No other exercise.  02/17/24 Since our last visit she had been started on generic symbicort 2 puffs twice a day. She reports being less out of breath and able to walk the dogs up the hills. Denies cough or wheezing.  Social History: Former smoker. 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, broadcasting/film/video   Past Medical History:  Diagnosis Date   Allergy    seasonal allergies   Anemia    hx of    Anxiety    on meds   Aortic atherosclerosis (HCC)    Arthritis    generalized   B12 deficiency 03/31/2012   Depression    on meds--hx of   Diverticular disease    Emphysema of lung (HCC)    HSV-2 (herpes simplex virus 2) infection    Hyperlipidemia    diet controlled   Hypertension    Mitral valve prolapse    Osteoporosis 01/14/2018   [facility] T score -2.8 at Rt femur neck and L1-L4 AP spine   Substance abuse (HCC)    Thoracic aortic ectasia (HCC)    Thyroid disease    on meds   Tobacco dependence    Vitamin D deficiency      Family History  Problem Relation Age of Onset   Stroke Mother    Macular degeneration Mother    Breast cancer Mother 60   Parkinson's disease Father    Parkinson's disease Sister    Diabetes Sister    Colon polyps Neg Hx    Colon cancer Neg Hx    Esophageal cancer Neg Hx    Stomach cancer Neg Hx     Rectal cancer Neg Hx      Social History   Occupational History   Occupation: Magazine features editor: [employer]    Comment: Retired  Tobacco Use   Smoking status: Former    Current packs/day: 0.50    Average packs/day: 0.5 packs/day for 43.0 years (21.5 ttl pk-yrs)    Types: Cigarettes   Smokeless tobacco: Never   Tobacco comments:    3 cigarettes daily 10/27/2021  Vaping Use   Vaping status: Never Used  Substance and Sexual Activity   Alcohol use: No    Alcohol/week: 0.0 standard drinks of alcohol   Drug use: No   Sexual activity: Never    Allergies  Allergen Reactions   Quinolones Other (See Comments)    Fluoroquinolones should be avoided in aortic patients unless alternative therapy is not Davidson option. (Aortic Dissection)   Codeine Itching   Nickel Dermatitis and Itching     Outpatient Medications Prior to Visit  Medication Sig Dispense Refill   acetaminophen (TYLENOL) 325 MG tablet as needed for pain.     [START ON 12/25/2024] budesonide-formoterol (SYMBICORT) 80-4.5 MCG/ACT inhaler Inhale 2 puffs into the lungs 2 (  two) times daily. 1 each 5   buPROPion (WELLBUTRIN XL) 300 MG 24 hr tablet Take 300 mg by mouth daily.     ELIQUIS 2.5 MG TABS tablet Take 2.5 mg by mouth 2 (two) times daily.     levothyroxine (SYNTHROID) 50 MCG tablet TAKE 1 TABLET (50 MCG TOTAL) BY MOUTH DAILY. 90 tablet 3   MAGNESIUM CITRATE PO Take 250 mg by mouth daily at 6 (six) AM.     metoprolol tartrate (LOPRESSOR) 25 MG tablet Take 12.5 mg by mouth 2 (two) times daily.     PARoxetine (PAXIL) 30 MG tablet Take by mouth daily.     polyethylene glycol (MIRALAX / GLYCOLAX) 17 g packet at bedtime.     traZODone (DESYREL) 50 MG tablet Take 1-2 tablets (50-100 mg total) by mouth at bedtime. 180 tablet 3   valACYclovir (VALTREX) 1000 MG tablet Take 500 mg by mouth daily.     budesonide-formoterol (SYMBICORT) 80-4.5 MCG/ACT inhaler Inhale 2 puffs into the lungs in the morning and  at bedtime. (Patient not taking: Reported on 02/17/2024) 10.2 g 0   rosuvastatin (CRESTOR) 20 MG tablet Take 20 mg by mouth daily. (Patient not taking: Reported on 02/17/2024)     No facility-administered medications prior to visit.    Review of Systems  Constitutional:  Negative for chills, diaphoresis, fever, malaise/fatigue and weight loss.  HENT:  Negative for congestion.   Respiratory:  Negative for cough, hemoptysis, sputum production, shortness of breath and wheezing.   Cardiovascular:  Negative for chest pain, palpitations and leg swelling.     Objective:   Vitals:   02/17/24 1457  BP: 124/80  Pulse: 62  SpO2: 97%  Weight: 129 lb 9.6 oz (58.8 kg)  Height: 5\' 6"  (1.676 m)   SpO2: 97 %  Physical Exam: General: Well-appearing, no acute distress HENT: [place], AT Eyes: EOMI, no scleral icterus Respiratory: Clear to auscultation bilaterally.  No crackles, wheezing or rales Cardiovascular: RRR, -M/R/G, no JVD Extremities:-Edema,-tenderness Neuro: AAO x4, CNII-XII grossly intact Psych: Normal mood, normal affect  Data Reviewed:  Imaging: CT Lung Screen 06/02/23 - Mild centrilobular emphysema. Biapical pleuroparenchymal scarring. RML scarring with mucoid impaction  PFT: 06/14/23 FVC 2.41 (79%) FEV1 1.83 (79%) Ratio 72  TLC 97% DLCO 73% Interpretation: No obstructive or restrictive lung defect. Mildly reduced gas exchange  Labs: CBC    Component Value Date/Time   WBC 6.7 11/22/2022 0710   RBC 3.37 (L) 11/22/2022 0710   HGB 10.9 (L) 11/22/2022 0710   HGB 13.1 12/07/2019 1629   HGB 11.4 (L) 04/28/2012 1528   HCT 31.9 (L) 11/22/2022 0710   HCT 37.6 12/07/2019 1629   HCT 32.3 (L) 04/28/2012 1528   PLT 161 11/22/2022 0710   PLT 191 12/07/2019 1629   MCV 94.7 11/22/2022 0710   MCV 98 (H) 12/07/2019 1629   MCV 95 04/28/2012 1528   MCH 32.3 11/22/2022 0710   MCHC 34.2 11/22/2022 0710   RDW 14.1 11/22/2022 0710   RDW 12.3 12/07/2019 1629   RDW 13.0 04/28/2012 1528    LYMPHSABS 1.3 11/04/2021 1212   LYMPHSABS 1.6 10/29/2018 1131   LYMPHSABS 1.3 04/28/2012 1528   MONOABS 0.6 11/04/2021 1212   EOSABS 0.0 11/04/2021 1212   EOSABS 0.0 10/29/2018 1131   EOSABS 0.1 04/28/2012 1528   BASOSABS 0.0 11/04/2021 1212   BASOSABS 0.0 10/29/2018 1131   BASOSABS 0.0 04/28/2012 1528        Assessment & Plan:   Discussion: 73 year old  female former smoker with emphysema, HTN, atrial fibrillation, hx aortic dissection, HLD and depression who presents for follow-up for emphysema. Reviewed PFTs and CT scan. Mild emphysema but no significant obstruction. Partial response to inhalers so this was trialed. Shortness of breath with activity but has improved with ICS/LABA   Discussed clinical course and management of COPD including bronchodilator regimen, preventive care and action plan for exacerbation.    Emphysema --CONTINUE generic Symbicort 80-4.5 mcg TWO puffs in the morning and evening. Rinse mouth out after use. --Encourage regular aerobic exercise and upper body resistances. Encourage yoga   Health Maintenance Immunization History  Administered Date(s) Administered   Influenza, High Dose Seasonal PF 10/04/2018   Influenza, Seasonal, Injecte, Preservative Fre 11/24/2012   Influenza,inj,Quad PF,6+ Mos 12/04/2013, 11/01/2014, 09/30/2015, 10/29/2016, 11/02/2017   Influenza-Unspecified 09/06/2019, 11/03/2021   PFIZER(Purple Top)SARS-COV-2 Vaccination 02/10/2020, 03/05/2020   PPD Test 04/06/2012   Pneumococcal Conjugate-13 09/30/2015   Pneumococcal Polysaccharide-23 10/23/2010, 10/29/2016   Tdap 08/25/2013   CT Lung Screen - enrolled  No orders of the defined types were placed in this encounter.  No orders of the defined types were placed in this encounter.   Return in about 6 months (around 08/16/2024).  I have spent a total time of 20-minutes on the day of the appointment including chart review, data review, collecting history, coordinating care and  discussing medical diagnosis and plan with the patient/family. Past medical history, allergies, medications were reviewed. Pertinent imaging, labs and tests included in this note have been reviewed and interpreted independently by me.  Anjenette Gerbino Mechele Collin, MD [facility] 02/17/2024 3:13 PM

## 2024-02-17 NOTE — Patient Instructions (Addendum)
 Emphysema --CONTINUE generic Symbicort 80-4.5 mcg TWO puffs in the morning and evening. Rinse mouth out after use. --Encourage regular aerobic exercise and upper body resistances. Encourage yoga

## 2024-02-22 NOTE — Progress Notes (Signed)
 Cardiology Office Note:  .   Date:  02/29/2024  ID:  Kristine Davidson, DOB [birth year], MRN [id number] PCP: Melida Quitter, MD  [facility] Providers Cardiologist:  Christell Constant, MD    History of Present Illness: .   Kristine Davidson is a 73 y.o. female with a hx of PAF; CAC and Aortic Atherosclerosis-cardiac CT 10/2021 mild CAD p-mRCA calcium score 193 , MVP, and HLD.  Seen incidentally for the Davidson aortic dissection.  S/p ascending/hemiarch replacement on 11/06/21 at [facility] complicated by R arm compartment syndrome s/p surgery and BMS to subclavian (R pox, R distal R axillary).    Patient comes in for regular f/u. Has chronic DOE with walking that is unchanged and chronic aches and pains. Having body aches on rosuvastatin. No chest pain, palpitations, edema. Walks her 2 dogs daily.     ROS:    Studies Reviewed: Marland Kitchen         Prior CV Studies: ECHO COMPLETE WO IMAGING ENHANCING AGENT 05/07/2023  Narrative ECHOCARDIOGRAM REPORT    Patient Name:   Kristine Davidson Date of Exam: 05/07/2023 Medical Rec #:  [id number]        Height:       66.0 in Accession #:    [id number]       Weight:       125.2 lb Date of Birth:  [birth year]         BSA:          1.639 m Patient Age:    72 years         BP:           120/66 mmHg Patient Gender: F                HR:           53 bpm. Exam Location:  [address]  Procedure: 2D Echo, Cardiac Doppler and Color Doppler  Indications:    I34.0 MR  History:        Patient has prior history of Echocardiogram examinations, most recent 04/10/2022. H/o dissection of ascending aorta s/p replacement 11/07/21), MR, Arrythmias:Atrial Fibrillation; Risk Factors:Hypertension, Dyslipidemia and Current Smoker.  Sonographer:    Samule Ohm RDCS Referring Phys: [id number] Nalina Yeatman M Dicky Boer  IMPRESSIONS   1. Left ventricular ejection fraction, by estimation, is 55 to 60%. The left ventricle has normal function. The left ventricle has no regional wall  motion abnormalities. Left ventricular diastolic parameters are consistent with Grade II diastolic dysfunction (pseudonormalization). 2. Right ventricular systolic function is normal. The right ventricular size is normal. There is normal pulmonary artery systolic pressure. The estimated right ventricular systolic pressure is 26.5 mmHg. 3. Left atrial size was mildly dilated. 4. Right atrial size was mildly dilated. 5. The mitral valve is abnormal with mild bileaflet prolapse. Mild mitral valve regurgitation. No evidence of mitral stenosis. 6. The aortic valve is tricuspid. Aortic valve regurgitation is trivial. No aortic stenosis is present. 7. Aortic dilatation noted and root/ascending aorta has been repaired/replaced. There is borderline dilatation of the aortic root, measuring 37 mm. 8. The inferior vena cava is dilated in size with >50% respiratory variability, suggesting right atrial pressure of 8 mmHg.  FINDINGS Left Ventricle: Left ventricular ejection fraction, by estimation, is 55 to 60%. The left ventricle has normal function. The left ventricle has no regional wall motion abnormalities. The left ventricular internal cavity size was normal in size. There is no left ventricular hypertrophy. Left ventricular diastolic  parameters are consistent with Grade II diastolic dysfunction (pseudonormalization).  Right Ventricle: The right ventricular size is normal. No increase in right ventricular wall thickness. Right ventricular systolic function is normal. There is normal pulmonary artery systolic pressure. The tricuspid regurgitant velocity is 2.15 m/s, and with Davidson assumed right atrial pressure of 8 mmHg, the estimated right ventricular systolic pressure is 26.5 mmHg.  Left Atrium: Left atrial size was mildly dilated.  Right Atrium: Right atrial size was mildly dilated.  Pericardium: There is no evidence of pericardial effusion.  Mitral Valve: The mitral valve is abnormal. Mild mitral valve  regurgitation. No evidence of mitral valve stenosis.  Tricuspid Valve: The tricuspid valve is normal in structure. Tricuspid valve regurgitation is trivial.  Aortic Valve: The aortic valve is tricuspid. Aortic valve regurgitation is trivial. No aortic stenosis is present.  Pulmonic Valve: The pulmonic valve was normal in structure. Pulmonic valve regurgitation is trivial.  Aorta: Aortic dilatation noted and the aortic root/ascending aorta has been repaired/replaced. There is borderline dilatation of the aortic root, measuring 37 mm.  Venous: The inferior vena cava is dilated in size with greater than 50% respiratory variability, suggesting right atrial pressure of 8 mmHg.  IAS/Shunts: No atrial level shunt detected by color flow Doppler.   LEFT VENTRICLE PLAX 2D LVIDd:         4.60 cm   Diastology LVIDs:         2.90 cm   LV e' medial:    6.42 cm/s LV PW:         0.90 cm   LV E/e' medial:  21.3 LV IVS:        0.90 cm   LV e' lateral:   13.70 cm/s LVOT diam:     1.90 cm   LV E/e' lateral: 10.0 LV SV:         58 LV SV Index:   36 LVOT Area:     2.84 cm   RIGHT VENTRICLE             IVC RV S prime:     14.80 cm/s  IVC diam: 2.50 cm TAPSE (M-mode): 1.7 cm RVSP:           26.5 mmHg  LEFT ATRIUM           Index        RIGHT ATRIUM           Index LA diam:      4.60 cm 2.81 cm/m   RA Pressure: 8.00 mmHg LA Vol (A2C): 23.8 ml 14.52 ml/m  RA Area:     19.80 cm LA Vol (A4C): 49.5 ml 30.20 ml/m  RA Volume:   61.90 ml  37.77 ml/m AORTIC VALVE LVOT Vmax:   92.60 cm/s LVOT Vmean:  60.200 cm/s LVOT VTI:    0.206 m  AORTA Ao Root diam: 3.70 cm Ao Asc diam:  3.30 cm  MITRAL VALVE                TRICUSPID VALVE MV Area (PHT): 3.65 cm     TR Peak grad:   18.5 mmHg MV Decel Time: 208 msec     TR Vmax:        215.00 cm/s MV E velocity: 137.00 cm/s  Estimated RAP:  8.00 mmHg MV A velocity: 43.50 cm/s   RVSP:           26.5 mmHg MV E/A ratio:  3.15 SHUNTS Systemic VTI:  0.21  m Systemic  Diam: 1.90 cm  Dalton McleanMD Electronically signed by Wilfred Lacy Signature Date/Time: 05/07/2023/5:23:46 PM    Final       Risk Assessment/Calculations:    CHA2DS2-VASc Score = 5   This indicates a 7.2% annual risk of stroke. The patient's score is based upon: CHF History: 0 HTN History: 1 Diabetes History: 0 Stroke History: 0 Vascular Disease History: 1 Age Score: 2 Gender Score: 1            Physical Exam:   VS:  BP 114/62   Pulse (!) 50   Ht 5\' 6"  (1.676 m)   Wt 126 lb 3.2 oz (57.2 kg)   SpO2 97%   BMI 20.37 kg/m    Wt Readings from Last 3 Encounters:  02/29/24 126 lb 3.2 oz (57.2 kg)  02/17/24 129 lb 9.6 oz (58.8 kg)  12/03/23 130 lb 6.4 oz (59.1 kg)    GEN: Thin, in no acute distress NECK: No JVD; No carotid bruits CARDIAC:  RRR, no murmurs, rubs, gallops RESPIRATORY:  Clear to auscultation without rales, wheezing or rhonchi  ABDOMEN: Soft, non-tender, non-distended EXTREMITIES:  No edema; No deformity   ASSESSMENT AND PLAN: .    Preop clearance for molar removal by Dr. Merryl Hacker next week.[facility] CV surgeons have already told her how long to hold Eliquis. According to the Revised Cardiac Risk Index (RCRI), her Perioperative Risk of Major Cardiac Event is (%): 0.9  Her Functional Capacity in METs is: 5.62 according to the [facility] Activity Status Index (DASI).   PAF - now on DOAC monotherapy and metoprolol    Mild CAD and Aortic Atherosclerosis &HLD Statin myalgia  - LDL at goal in 09/2023 has been off rosuvastatin. 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 Will order for May with f/u after   Aortic dissection S/p subclavian and axillary PCI S/p Aortic Dissection - is smoke free - off crestor and starting zetia LDL goal < 55 - quinolone allergy is listed     Former  tobacco use DOE -  PFTs mild COPD        Dispo: f/u Dr. Salena Saner in June  Signed, Jacolyn Reedy, PA-C

## 2024-02-29 ENCOUNTER — Ambulatory Visit: Payer: Medicare Other | Attending: Physician Assistant | Admitting: Physician Assistant

## 2024-02-29 ENCOUNTER — Encounter: Payer: Self-pay | Admitting: Physician Assistant

## 2024-02-29 ENCOUNTER — Other Ambulatory Visit: Payer: Self-pay

## 2024-02-29 VITALS — BP 114/62 | HR 50 | Ht 66.0 in | Wt 126.2 lb

## 2024-02-29 DIAGNOSIS — I7101 Dissection of ascending aorta: Secondary | ICD-10-CM | POA: Insufficient documentation

## 2024-02-29 DIAGNOSIS — R0609 Other forms of dyspnea: Secondary | ICD-10-CM

## 2024-02-29 DIAGNOSIS — E54 Ascorbic acid deficiency: Secondary | ICD-10-CM | POA: Diagnosis present

## 2024-02-29 DIAGNOSIS — I480 Paroxysmal atrial fibrillation: Secondary | ICD-10-CM

## 2024-02-29 DIAGNOSIS — I2510 Atherosclerotic heart disease of native coronary artery without angina pectoris: Secondary | ICD-10-CM | POA: Insufficient documentation

## 2024-02-29 MED ORDER — EZETIMIBE 10 MG PO TABS: 10.0000 mg | ORAL_TABLET | Freq: Every day | ORAL | 3 refills | Status: DC

## 2024-02-29 NOTE — Patient Instructions (Signed)
 Medication Instructions:  Your physician has recommended you make the following change in your medication:  1-START Zetia 10 mg by mouth daily.  *If you need a refill on your cardiac medications before your next appointment, please call your pharmacy*  Lab Work: Your physician recommends that you lab work today- BMET and CBC at [facility].  If you have labs (blood work) drawn today and your tests are completely normal, you will receive your results only by: [employer] (if you have MyChart) OR A paper copy in the mail If you have any lab test that is abnormal or we need to change your treatment, we will call you to review the results.  Testing/Procedures: Your physician has requested that you have an echocardiogram in May. Echocardiography is a painless test that uses sound waves to create images of your heart. It provides your doctor with information about the size and shape of your heart and how well your heart's chambers and valves are working. This procedure takes approximately one hour. There are no restrictions for this procedure. Please do NOT wear cologne, perfume, aftershave, or lotions (deodorant is allowed). Please arrive 15 minutes prior to your appointment time.  Please note: We ask at that you not bring children with you during ultrasound (echo/ vascular) testing. Due to room size and safety concerns, children are not allowed in the ultrasound rooms during exams. Our front office staff cannot provide observation of children in our lobby area while testing is being conducted. An adult accompanying a patient to their appointment will only be allowed in the ultrasound room at the discretion of the ultrasound technician under special circumstances. We apologize for any inconvenience.  Follow-Up: At [facility], you and your health needs are our priority.  As part of our continuing mission to provide you with exceptional heart care, we have created designated Provider Care  Teams.  These Care Teams include your primary Cardiologist (physician) and Advanced Practice Providers (APPs -  Physician Assistants and Nurse Practitioners) who all work together to provide you with the care you need, when you need it.  We recommend signing up for the patient portal called "MyChart".  Sign up information is provided on this After Visit Summary.  MyChart is used to connect with patients for Virtual Visits (Telemedicine).  Patients are able to view lab/test results, encounter notes, upcoming appointments, etc.  Non-urgent messages can be sent to your provider as well.   To learn more about what you can do with MyChart, go to [email or web address].    Your next appointment:   4 month(s)  Provider:   Christell Constant, MD     Other Instructions You may also go to any of these [facility] locations:   [facility] - [address] ([facility]) - [address] [phone] [address]    - [address]    [place]  - [address]    [place] - [address] - [address] [employer]  - [address] - [address] ([address]         1st Floor: - Lobby - Registration  - Pharmacy  - Lab - Cafe  2nd Floor: - PV Lab - Diagnostic Testing (echo, CT, nuclear med)  3rd Floor: - Vacant  4th Floor: - TCTS (cardiothoracic surgery) - AFib Clinic - Structural Heart Clinic - Vascular  Surgery  - Vascular Ultrasound  5th Floor: - [facility]Care [facility] (general and EP) - Clinical Pharmacy for coumadin, hypertension, lipid, weight-loss medications, and med management appointments    Valet parking services will be available as well.

## 2024-03-01 LAB — CBC WITH DIFFERENTIAL/PLATELET
Basophils Absolute: 0 10*3/uL (ref 0.0–0.2)
Basos: 0 %
EOS (ABSOLUTE): 0 10*3/uL (ref 0.0–0.4)
Eos: 1 %
Hematocrit: 34.5 % (ref 34.0–46.6)
Hemoglobin: 11.4 g/dL (ref 11.1–15.9)
Immature Grans (Abs): 0 10*3/uL (ref 0.0–0.1)
Immature Granulocytes: 0 %
Lymphocytes Absolute: 1.4 10*3/uL (ref 0.7–3.1)
Lymphs: 27 %
MCH: 32.9 pg (ref 26.6–33.0)
MCHC: 33 g/dL (ref 31.5–35.7)
MCV: 99 fL — ABNORMAL HIGH (ref 79–97)
Monocytes Absolute: 0.5 10*3/uL (ref 0.1–0.9)
Monocytes: 9 %
Neutrophils Absolute: 3.3 10*3/uL (ref 1.4–7.0)
Neutrophils: 63 %
Platelets: 179 10*3/uL (ref 150–450)
RBC: 3.47 x10E6/uL — ABNORMAL LOW (ref 3.77–5.28)
RDW: 12.9 % (ref 11.7–15.4)
WBC: 5.3 10*3/uL (ref 3.4–10.8)

## 2024-03-01 LAB — BASIC METABOLIC PANEL
BUN/Creatinine Ratio: 17 (ref 12–28)
BUN: 13 mg/dL (ref 8–27)
CO2: 25 mmol/L (ref 20–29)
Calcium: 8.9 mg/dL (ref 8.7–10.3)
Chloride: 100 mmol/L (ref 96–106)
Creatinine, Ser: 0.78 mg/dL (ref 0.57–1.00)
Glucose: 111 mg/dL — ABNORMAL HIGH (ref 70–99)
Potassium: 4.4 mmol/L (ref 3.5–5.2)
Sodium: 138 mmol/L (ref 134–144)
eGFR: 79 mL/min/{1.73_m2} (ref >59)

## 2024-04-24 ENCOUNTER — Ambulatory Visit (HOSPITAL_COMMUNITY): Attending: Cardiovascular Disease

## 2024-04-24 DIAGNOSIS — I7101 Dissection of ascending aorta: Secondary | ICD-10-CM

## 2024-04-24 DIAGNOSIS — R0609 Other forms of dyspnea: Secondary | ICD-10-CM | POA: Diagnosis not present

## 2024-04-24 DIAGNOSIS — I2510 Atherosclerotic heart disease of native coronary artery without angina pectoris: Secondary | ICD-10-CM

## 2024-04-24 DIAGNOSIS — E54 Ascorbic acid deficiency: Secondary | ICD-10-CM

## 2024-04-24 DIAGNOSIS — I480 Paroxysmal atrial fibrillation: Secondary | ICD-10-CM | POA: Diagnosis not present

## 2024-04-24 LAB — ECHOCARDIOGRAM COMPLETE
Area-P 1/2: 4.23 cm2
S' Lateral: 2.9 cm

## 2024-04-26 DIAGNOSIS — E785 Hyperlipidemia, unspecified: Secondary | ICD-10-CM | POA: Diagnosis not present

## 2024-04-26 DIAGNOSIS — D649 Anemia, unspecified: Secondary | ICD-10-CM | POA: Diagnosis not present

## 2024-04-26 DIAGNOSIS — E039 Hypothyroidism, unspecified: Secondary | ICD-10-CM | POA: Diagnosis not present

## 2024-04-26 DIAGNOSIS — M810 Age-related osteoporosis without current pathological fracture: Secondary | ICD-10-CM | POA: Diagnosis not present

## 2024-04-26 DIAGNOSIS — E7800 Pure hypercholesterolemia, unspecified: Secondary | ICD-10-CM | POA: Diagnosis not present

## 2024-04-27 DIAGNOSIS — D72819 Decreased white blood cell count, unspecified: Secondary | ICD-10-CM | POA: Diagnosis not present

## 2024-04-28 ENCOUNTER — Encounter: Payer: Self-pay | Admitting: Internal Medicine

## 2024-05-02 DIAGNOSIS — Z0000 Encounter for general adult medical examination without abnormal findings: Secondary | ICD-10-CM | POA: Diagnosis not present

## 2024-05-02 DIAGNOSIS — I2510 Atherosclerotic heart disease of native coronary artery without angina pectoris: Secondary | ICD-10-CM | POA: Diagnosis not present

## 2024-05-02 DIAGNOSIS — G729 Myopathy, unspecified: Secondary | ICD-10-CM | POA: Diagnosis not present

## 2024-05-02 DIAGNOSIS — M810 Age-related osteoporosis without current pathological fracture: Secondary | ICD-10-CM | POA: Diagnosis not present

## 2024-05-02 DIAGNOSIS — I480 Paroxysmal atrial fibrillation: Secondary | ICD-10-CM | POA: Diagnosis not present

## 2024-05-02 DIAGNOSIS — Z9889 Other specified postprocedural states: Secondary | ICD-10-CM | POA: Diagnosis not present

## 2024-05-02 DIAGNOSIS — I7776 Dissection of artery of upper extremity: Secondary | ICD-10-CM | POA: Diagnosis not present

## 2024-05-02 DIAGNOSIS — E785 Hyperlipidemia, unspecified: Secondary | ICD-10-CM | POA: Diagnosis not present

## 2024-05-02 DIAGNOSIS — Z1339 Encounter for screening examination for other mental health and behavioral disorders: Secondary | ICD-10-CM | POA: Diagnosis not present

## 2024-05-02 DIAGNOSIS — Z23 Encounter for immunization: Secondary | ICD-10-CM | POA: Diagnosis not present

## 2024-05-02 DIAGNOSIS — Z1331 Encounter for screening for depression: Secondary | ICD-10-CM | POA: Diagnosis not present

## 2024-05-02 DIAGNOSIS — J439 Emphysema, unspecified: Secondary | ICD-10-CM | POA: Diagnosis not present

## 2024-05-02 DIAGNOSIS — Z87891 Personal history of nicotine dependence: Secondary | ICD-10-CM | POA: Diagnosis not present

## 2024-05-02 DIAGNOSIS — I7101 Dissection of ascending aorta: Secondary | ICD-10-CM | POA: Diagnosis not present

## 2024-05-03 ENCOUNTER — Other Ambulatory Visit: Payer: Self-pay | Admitting: Internal Medicine

## 2024-05-03 DIAGNOSIS — Z87891 Personal history of nicotine dependence: Secondary | ICD-10-CM

## 2024-05-03 DIAGNOSIS — Z1231 Encounter for screening mammogram for malignant neoplasm of breast: Secondary | ICD-10-CM

## 2024-05-05 ENCOUNTER — Ambulatory Visit
Admission: RE | Admit: 2024-05-05 | Discharge: 2024-05-05 | Disposition: A | Discharge status: Home / Self Care | Source: Ambulatory Visit | Attending: Internal Medicine | Admitting: Internal Medicine

## 2024-05-05 DIAGNOSIS — Z1231 Encounter for screening mammogram for malignant neoplasm of breast: Secondary | ICD-10-CM

## 2024-06-06 ENCOUNTER — Ambulatory Visit
Admission: RE | Admit: 2024-06-06 | Discharge: 2024-06-06 | Disposition: A | Discharge status: Home / Self Care | Source: Ambulatory Visit | Attending: Internal Medicine | Admitting: Internal Medicine

## 2024-06-06 DIAGNOSIS — Z87891 Personal history of nicotine dependence: Secondary | ICD-10-CM | POA: Diagnosis not present

## 2024-06-06 DIAGNOSIS — Z122 Encounter for screening for malignant neoplasm of respiratory organs: Secondary | ICD-10-CM | POA: Diagnosis not present

## 2024-06-07 ENCOUNTER — Encounter: Payer: Self-pay | Admitting: Internal Medicine

## 2024-06-07 NOTE — Telephone Encounter (Signed)
 Duplicate. See other thread

## 2024-06-09 ENCOUNTER — Ambulatory Visit: Payer: Medicare Other

## 2024-06-09 VITALS — BP 126/60 | HR 47 | Temp 98.0°F | Resp 18 | Ht 66.0 in | Wt 122.4 lb

## 2024-06-09 DIAGNOSIS — M810 Age-related osteoporosis without current pathological fracture: Secondary | ICD-10-CM | POA: Diagnosis not present

## 2024-06-09 MED ORDER — DENOSUMAB 60 MG/ML ~~LOC~~ SOSY
60.0000 mg | PREFILLED_SYRINGE | Freq: Once | SUBCUTANEOUS | Status: AC
  Administered 2024-06-09: 60 mg via SUBCUTANEOUS
  Filled 2024-06-09: qty 1

## 2024-06-09 NOTE — Progress Notes (Cosign Needed)
 Diagnosis: Osteoporosis  Provider:  Chilton Greathouse MD  Procedure: Injection  Prolia (Denosumab), Dose: 60 mg, Site: subcutaneous, Number of injections: 1  Injection Site(s): Right arm  Post Care: Patient declined observation  Discharge: Condition: Stable, Destination: Home . AVS Provided  Performed by:  Wyvonne Lenz, RN

## 2024-06-17 ENCOUNTER — Encounter: Payer: Self-pay | Admitting: Internal Medicine

## 2024-06-21 ENCOUNTER — Encounter: Payer: Self-pay | Admitting: Internal Medicine

## 2024-08-29 ENCOUNTER — Encounter: Payer: Self-pay | Admitting: Internal Medicine

## 2024-08-29 ENCOUNTER — Other Ambulatory Visit (HOSPITAL_COMMUNITY): Payer: Self-pay

## 2024-08-29 ENCOUNTER — Ambulatory Visit: Attending: Internal Medicine | Admitting: Internal Medicine

## 2024-08-29 VITALS — BP 104/60 | HR 53 | Resp 16 | Ht 66.0 in | Wt 123.6 lb

## 2024-08-29 DIAGNOSIS — Z23 Encounter for immunization: Secondary | ICD-10-CM | POA: Diagnosis not present

## 2024-08-29 DIAGNOSIS — I2510 Atherosclerotic heart disease of native coronary artery without angina pectoris: Secondary | ICD-10-CM | POA: Diagnosis not present

## 2024-08-29 DIAGNOSIS — I7101 Dissection of ascending aorta: Secondary | ICD-10-CM | POA: Insufficient documentation

## 2024-08-29 DIAGNOSIS — I480 Paroxysmal atrial fibrillation: Secondary | ICD-10-CM | POA: Insufficient documentation

## 2024-08-29 MED ORDER — TETANUS-DIPHTH-ACELL PERTUSSIS 5-2.5-18.5 LF-MCG/0.5 IM SUSY
0.5000 mL | PREFILLED_SYRINGE | Freq: Once | INTRAMUSCULAR | 0 refills | Status: AC
  Filled 2024-08-29: qty 0.5, 1d supply, fill #0

## 2024-08-29 MED ORDER — FLUZONE HIGH-DOSE 0.5 ML IM SUSY
0.5000 mL | PREFILLED_SYRINGE | Freq: Once | INTRAMUSCULAR | 0 refills | Status: AC
  Filled 2024-08-29: qty 0.5, 1d supply, fill #0

## 2024-08-29 NOTE — Progress Notes (Signed)
 Cardiology Office Note:    Date:  08/29/2024   ID:  Kristine Davidson, DOB 06-09-1951, MRN [id number]  PCP:  Stephane Leita DEL, MD   [facility] Providers Cardiologist:  Stanly DELENA Leavens, MD     Referring MD: Stephane Leita DEL, MD   CC: Follow up aortic dissection  History of Present Illness:    Kristine Davidson is a 73 y.o. female with a hx of PAF; CAC and Aortic Atherosclerosis, MVP, and HLD.  Seen incidentally for the an aortic dissection.  S/p Surgery (at [facility]) complicated by R arm compartment syndrome s/p surgery and BMS to subclavian (R pox, R distal R axillary).   Since stopping amiodarone has called in with palpitations and increase fatigue. 2023: In routine follow up.  Saw [facility]: we performed subclavian imaging for this patient.  2024: Query of worsening MR-> Echo done here showed myxomatous mitral valve with mild bileaflet prolapse and mild MR  Kristine Davidson is a 73 year old female with aortic dissection and paroxysmal atrial fibrillation who presents for follow-up of her cardiovascular conditions.  She has a history of aortic dissection, which was repaired at [facility]. Prior to the dissection, she experienced slight shortness of breath when climbing stairs. She attributes some past health issues to stress and lifestyle choices, including heavy drinking and smoking, but has been sober for 19 years and no longer smokes. She is concerned about the size of her aorta and monitors it through imaging studies. She is scheduled for a CT scan and has had echocardiograms in the past to monitor her condition.  She has paroxysmal atrial fibrillation and is currently on Eliquis . She feels tired but has become accustomed to it. She does not recall feeling palpitations or significant symptoms when in atrial fibrillation, although she sometimes noticed her heart feeling 'sideways' when turning over. Recently, she has not experienced any heart racing or noticeable symptoms of atrial  fibrillation.  Her blood pressure is consistently low, often around 105/54, and her pulse can be as low as 54. She is on a very low dose of metoprolol, which she monitors with a pulse oximeter.  She has a history of hyperlipidemia. She has been co-managed by multiple specialists, including those at [facility] for her vascular disease and another provider for her atrial fibrillation.  In terms of family history, her sister and brother have been evaluated in light of her aortic dissection. She does not have children, which influences the consideration of genetic testing for her condition.  Past Medical History:  Diagnosis Date   Allergy    seasonal allergies   Anemia    hx of    Anxiety    on meds   Aortic atherosclerosis (HCC)    Arthritis    generalized   B12 deficiency 03/31/2012   Depression    on meds--hx of   Diverticular disease    Emphysema of lung (HCC)    HSV-2 (herpes simplex virus 2) infection    Hyperlipidemia    diet controlled   Hypertension    Mitral valve prolapse    Osteoporosis 01/14/2018   DEXA scan at [facility] 01/14/18 T score -2.8 at Rt femur neck and L1-L4 AP spine   Substance abuse (HCC)    Thoracic aortic ectasia (HCC)    Thyroid  disease    on meds   Tobacco dependence    Vitamin D  deficiency     Past Surgical History:  Procedure Laterality Date   aortic dissection repair  10/2021   COLONOSCOPY     D and C     TONSILLECTOMY     VARICOSE VEIN SURGERY  2000   Vascular Surgery US  upper extremity arterial duplex right        Current Medications: Current Meds  Medication Sig   acetaminophen  (TYLENOL ) 325 MG tablet Take 650 mg by mouth as needed for pain. Nightly Prn   budesonide -formoterol  (SYMBICORT ) 80-4.5 MCG/ACT inhaler Inhale 2 puffs into the lungs in the morning and at bedtime.   buPROPion  (WELLBUTRIN  XL) 300 MG 24 hr tablet Take 300 mg by mouth daily.   Cholecalciferol (VITAMIN D ) 50 MCG (2000 UT) CAPS Take 1 capsule by mouth daily.    Coenzyme Q10 (COQ10) 100 MG CAPS Take 1 tablet by mouth daily.   ELIQUIS  2.5 MG TABS tablet Take 2.5 mg by mouth 2 (two) times daily.   ezetimibe  (ZETIA ) 10 MG tablet Take 1 tablet (10 mg total) by mouth daily.   levothyroxine  (SYNTHROID ) 50 MCG tablet TAKE 1 TABLET (50 MCG TOTAL) BY MOUTH DAILY.   MAGNESIUM CITRATE PO Take 250 mg by mouth daily at 6 (six) AM.   PARoxetine  (PAXIL ) 30 MG tablet Take by mouth daily.   polyethylene glycol (MIRALAX  / GLYCOLAX ) 17 g packet at bedtime.   rosuvastatin  (CRESTOR ) 20 MG tablet Take 20 mg by mouth daily.   traZODone  (DESYREL ) 50 MG tablet Take 1-2 tablets (50-100 mg total) by mouth at bedtime.   valACYclovir  (VALTREX ) 1000 MG tablet Take 500 mg by mouth daily.   [DISCONTINUED] metoprolol tartrate (LOPRESSOR) 25 MG tablet Take 12.5 mg by mouth 2 (two) times daily.     Allergies:   Quinolones, Codeine, and Nickel   Social History   Socioeconomic History   Marital status: Divorced    Spouse name: Not on file   Number of children: 0   Years of education: Not on file   Highest education level: Professional school degree (e.g., MD, DDS, DVM, JD)  Occupational History   Occupation: Teacher    Employer: [employer]    Comment: Retired  Tobacco Use   Smoking status: Former    Current packs/day: 0.50    Average packs/day: 0.5 packs/day for 43.0 years (21.5 ttl pk-yrs)    Types: Cigarettes   Smokeless tobacco: Never   Tobacco comments:    3 cigarettes daily 10/27/2021  Vaping Use   Vaping status: Never Used  Substance and Sexual Activity   Alcohol  use: No    Alcohol /week: 0.0 standard drinks of alcohol    Drug use: No   Sexual activity: Never  Other Topics Concern   Not on file  Social History Narrative   Lives alone.    Social Drivers of Corporate investment banker Strain: Low Risk  (11/02/2017)   Overall Financial Resource Strain (CARDIA)    Difficulty of Paying Living Expenses: Not very hard  Food Insecurity:  No Food Insecurity (11/02/2017)   Hunger Vital Sign    Worried About Running Out of Food in the Last Year: Never true    Ran Out of Food in the Last Year: Never true  Transportation Needs: No Transportation Needs (11/07/2021)   Received from [facility] - [facility]    In the past 12 months, has lack of transportation kept you from medical appointments or from getting medications?: No    Lack of Transportation (Non-Medical): No  Physical Activity: Inactive (11/02/2017)   Exercise Vital Sign  Days of Exercise per Week: 0 days    Minutes of Exercise per Session: 0 min  Stress: No Stress Concern Present (11/02/2017)   [employer] of Occupational Health - Occupational Stress Questionnaire    Feeling of Stress : Not at all  Social Connections: Somewhat Isolated (11/02/2017)   Social Connection and Isolation Panel    Frequency of Communication with Friends and Family: More than three times a week    Frequency of Social Gatherings with Friends and Family: More than three times a week    Attends Religious Services: Never    Database administrator or Organizations: Yes    Attends Engineer, structural: More than 4 times per year    Marital Status: Divorced    Social: Comes with family that sometimes come, I met her in the echo lab, has a dog  Family History: The patient's family history includes Breast cancer (age of onset: 16) in her mother; Diabetes in her sister; Macular degeneration in her mother; Parkinson's disease in her father and sister; Stroke in her mother. There is no history of Colon polyps, Colon cancer, Esophageal cancer, Stomach cancer, or Rectal cancer.  ROS:   Please see the history of present illness.     All other systems reviewed and are negative.  EKGs/Labs/Other Studies Reviewed:    The following studies were reviewed today:  EKG:   11/2022: SR QTc WNL 03/19/22:  Sinus bradycardia 56  Cardiac Studies & Procedures    ______________________________________________________________________________________________     ECHOCARDIOGRAM  ECHOCARDIOGRAM COMPLETE 04/24/2024  Narrative ECHOCARDIOGRAM REPORT    Patient Name:   ANTHONY ROLAND Date of Exam: 04/24/2024 Medical Rec #:  [id number]        Height:       66.0 in Accession #:    [id number]       Weight:       126.2 lb Date of Birth:  1951-09-03         BSA:          1.644 m Patient Age:    73 years         BP:           114/62 mmHg Patient Gender: F                HR:           54 bpm. Exam Location:  [address]  Procedure: 2D Echo, Cardiac Doppler and Color Doppler (Both Spectral and Color Flow Doppler were utilized during procedure).  Indications:    I05.9 Mitral Valve Disorder  History:        Patient has prior history of Echocardiogram examinations, most recent 05/07/2023. Risk Factors:Dyslipidemia and Hypertension. Emphysema.  Sonographer:    Carl Rodgers-Jones RDCS Referring Phys: [id number] MICHELE M LENZE  IMPRESSIONS   1. Left ventricular ejection fraction, by estimation, is 60 to 65%. The left ventricle has normal function. The left ventricle has no regional wall motion abnormalities. Left ventricular diastolic parameters are consistent with Grade II diastolic dysfunction (pseudonormalization). 2. Right ventricular systolic function is normal. The right ventricular size is normal. There is normal pulmonary artery systolic pressure. 3. Left atrial size was severely dilated. 4. Right atrial size was mildly dilated. 5. The mitral valve is normal in structure. Mild mitral valve regurgitation. No evidence of mitral stenosis. There is mild prolapse of of the mitral valve. 6. The aortic valve is normal in structure. Aortic valve regurgitation is trivial. No aortic stenosis  is present. 7. Aortic root/ascending aorta has been repaired/replaced. 8. The inferior vena cava is dilated in size with >50% respiratory variability, suggesting right  atrial pressure of 8 mmHg.  FINDINGS Left Ventricle: Left ventricular ejection fraction, by estimation, is 60 to 65%. The left ventricle has normal function. The left ventricle has no regional wall motion abnormalities. The left ventricular internal cavity size was normal in size. There is no left ventricular hypertrophy. Left ventricular diastolic parameters are consistent with Grade II diastolic dysfunction (pseudonormalization). Indeterminate filling pressures.  Right Ventricle: The right ventricular size is normal. No increase in right ventricular wall thickness. Right ventricular systolic function is normal. There is normal pulmonary artery systolic pressure. The tricuspid regurgitant velocity is 2.40 m/s, and with an assumed right atrial pressure of 8 mmHg, the estimated right ventricular systolic pressure is 31.0 mmHg.  Left Atrium: Left atrial size was severely dilated.  Right Atrium: Right atrial size was mildly dilated.  Pericardium: There is no evidence of pericardial effusion.  Mitral Valve: The mitral valve is normal in structure. There is mild prolapse of of the mitral valve. Mild mitral valve regurgitation, with posteriorly-directed jet. No evidence of mitral valve stenosis.  Tricuspid Valve: The tricuspid valve is normal in structure. Tricuspid valve regurgitation is mild . No evidence of tricuspid stenosis.  Aortic Valve: The aortic valve is normal in structure. Aortic valve regurgitation is trivial. No aortic stenosis is present.  Pulmonic Valve: The pulmonic valve was normal in structure. Pulmonic valve regurgitation is mild to moderate. No evidence of pulmonic stenosis.  Aorta: The aortic root/ascending aorta has been repaired/replaced.  Venous: The inferior vena cava is dilated in size with greater than 50% respiratory variability, suggesting right atrial pressure of 8 mmHg.  IAS/Shunts: No atrial level shunt detected by color flow Doppler.   LEFT VENTRICLE PLAX  2D LVIDd:         5.00 cm   Diastology LVIDs:         2.90 cm   LV e' medial:    8.87 cm/s LV PW:         0.60 cm   LV E/e' medial:  12.6 LV IVS:        0.60 cm   LV e' lateral:   11.35 cm/s LVOT diam:     1.70 cm   LV E/e' lateral: 9.8 LV SV:         40 LV SV Index:   24 LVOT Area:     2.27 cm   RIGHT VENTRICLE            IVC RV Basal diam:  3.60 cm    IVC diam: 2.70 cm RV S prime:     9.25 cm/s TAPSE (M-mode): 1.6 cm  LEFT ATRIUM             Index        RIGHT ATRIUM           Index LA diam:        5.00 cm 3.04 cm/m   RA Area:     17.50 cm LA Vol (A2C):   68.3 ml 41.54 ml/m  RA Volume:   49.40 ml  30.04 ml/m LA Vol (A4C):   73.2 ml 44.52 ml/m LA Biplane Vol: 73.6 ml 44.76 ml/m AORTIC VALVE LVOT Vmax:   87.30 cm/s LVOT Vmean:  52.550 cm/s LVOT VTI:    0.177 m  AORTA Ao Root diam: 3.50 cm Ao Asc diam:  3.10 cm  MITRAL VALVE                TRICUSPID VALVE MV Area (PHT): 4.23 cm     TR Peak grad:   23.0 mmHg MV Decel Time: 180 msec     TR Vmax:        240.00 cm/s MV E velocity: 111.50 cm/s MV A velocity: 46.05 cm/s   SHUNTS MV E/A ratio:  2.42         Systemic VTI:  0.18 m Systemic Diam: 1.70 cm  Annabella Scarce MD Electronically signed by Annabella Scarce MD Signature Date/Time: 04/24/2024/5:18:11 PM    Final    MONITORS  LONG TERM MONITOR (3-14 DAYS) 04/13/2022  Narrative  Patient had a minimum heart rate of 49 bpm, maximum heart rate of 162 bpm, and average heart rate of 60 bpm.  Predominant underlying rhythm was sinus rhythm.  Several short runs of SVT, lasting 13 seconds at largest with rate of 162 at fastest.  Isolated PACs were rare (<1.0%).  Isolated PVCs were occaisonal (3.4%).  Triggered and diary events associated with sinus rhythm, sinus bradycardia, PACs, and PVCs.  Occasional, occasionally symptomatic PVCs.   CT SCANS  CT CORONARY MORPH W/CTA COR W/SCORE 11/05/2021  Addendum 11/05/2021  4:44 PM ADDENDUM REPORT: 11/05/2021  16:42  REVISION TO IMPRESSIONS: REVISION TO IMPRESSIONS IMPRESSION:  1. Mild CAD in proximal-mid RCA and mid RCA, CADRADS = 2.  2. Coronary calcium  score is 193 , which places the patient in the 77th percentile for age and sex matched control.  3. Normal coronary origins with right dominance.  4. Ascending aortic aneurysm with ascending aorta Type A dissection, visualized previously on CTA aorta exam 11/04/21 and described in radiologist's dictation. Aorta measures 40 mm at sinus of Valsalva and 49 mm at mid ascending aorta  5.  Moderate circumferential pericardial effusion.   Electronically Signed By: Soyla Merck M.D. On: 11/05/2021 16:42  Addendum 11/05/2021  2:49 PM ADDENDUM REPORT: 11/05/2021 14:46  HISTORY: Acute aortic syndrome (AAS) suspected  EXAM: Cardiac/Coronary  CT  TECHNIQUE: The patient was scanned on a [employer].  PROTOCOL: A 120 kV prospective scan was triggered in the descending thoracic aorta at 111 HU's. Axial non-contrast 3 mm slices were carried out through the heart. The data set was analyzed on a dedicated work station and scored using the Agatston method. Gantry rotation speed was 250 msecs and collimation was .6 mm. Beta blockade and 0.8 mg of sl NTG was given. The 3D data set was reconstructed in 5% intervals of the 35-75 % of the R-R cycle. Systolic and diastolic phases were analyzed on a dedicated work station using MPR, MIP and VRT modes. The patient received 95mL OMNIPAQUE  IOHEXOL  350 MG/ML SOLN contrast.  FINDINGS: Image quality: Adequate. Suboptimal contrast bolus timing, contrast bolus is in the false lumen due to misplaced bolus tracker.  Noise artifact is: Limited.  Coronary calcium  score is 193, which places the patient in the 77th percentile for age and sex matched control.  Coronary arteries: Normal coronary origins.  Right dominance.  Right Coronary Artery: Mild mixed atherosclerotic plaque in  the proximal to mid and mid RCA, 25-49%  Left Main Coronary Artery: Short left main coronary artery with no detectable plaque or stenosis.  Left Anterior Descending Coronary Artery: Minimal mixed atherosclerotic plaque in the proximal LAD, <25% stenosis. Patent diagonals without detectable plaque or stenosis.  Left Circumflex Artery: Minimal mixed atherosclerotic plaque in the proximal L circumflex artery, <25% stenosis.  Small caliber distal L circumflex artery. OM1 is normal caliber with no detectable plaque or stenosis.  Aorta: Type A ascending aorta dissection and ascending aorta aneurysm, previously assessed on CTA CAP dissection study 11/04/21. Dissection flap originates immediately above the origin of the RCA but does not involve the RCA origin.  Sinus of Valsalva:  R-L: 40 mm  L-Non: 40 mm  R-Non: 38 mm  Mid ascending aorta (at PA bifurcation): The dissected mid ascending aorta measures 49 mm.  Aortic Valve: No calcifications. Not involved in dissection flap, normal appearance and cusp excursion.  Other findings:  Normal pulmonary vein drainage into the left atrium.  Normal left atrial appendage without thrombus.  Normal size of the pulmonary artery.  IMPRESSION: 1. Mild CAD in proximal-mid RCA and mid RCA, CADRADS = 2.  2. Coronary calcium  score is 193, which places the patient in the 77th percentile for age and sex matched control.  3. Normal coronary origins with right dominance.  4. Ascending aortic aneurysm with ascending aorta Type A dissection, visualized previously on CTA aorta exam 11/04/21 and described in radiologist's dictation. Aorta measures 40 mm at sinus of Valsalva and 49 mm at mid ascending aorta.   Electronically Signed By: Soyla Merck M.D. On: 11/05/2021 14:46  Narrative EXAM: OVER-READ INTERPRETATION  CT CHEST  The following report is an over-read performed by radiologist Dr. Toribio Aye of [facility]  on 11/05/2021. This over-read does not include interpretation of cardiac or coronary anatomy or pathology. The coronary calcium  score/coronary CTA interpretation by the cardiologist is attached.  COMPARISON:  None.  FINDINGS: Type A thoracic aortic aneurysm/dissection again noted, currently measuring 4.9 cm in diameter. This is incompletely imaged. The false lumen and dissection flap extend back to the aortic root, directly adjacent to the ostium of the right coronary artery which remains patent at this time. Scattered areas of linear scarring are noted in the lungs, most evident in the right middle lobe. Within the visualized portions of the thorax there are no suspicious appearing pulmonary nodules or masses, there is no acute consolidative airspace disease, no pleural effusions, no pneumothorax and no lymphadenopathy. Visualized portions of the upper abdomen are unremarkable. There are no aggressive appearing lytic or blastic lesions noted in the visualized portions of the skeleton.  IMPRESSION: 1. Known type A thoracic aortic aneurysm/dissection redemonstrated, as above.  Electronically Signed: By: Toribio Aye M.D. On: 11/05/2021 10:32     ______________________________________________________________________________________________       Recent Labs: 09/27/2023: ALT 21 02/29/2024: BUN 13; Creatinine, Ser 0.78; Hemoglobin 11.4; Platelets 179; Potassium 4.4; Sodium 138  Recent Lipid Panel    Component Value Date/Time   CHOL 147 09/27/2023 0840   TRIG 68 09/27/2023 0840   HDL 71 09/27/2023 0840   CHOLHDL 2.1 09/27/2023 0840   CHOLHDL 2.3 11/05/2021 0610   VLDL 12 11/05/2021 0610   LDLCALC 62 09/27/2023 0840    Risk Assessment/Calculations:    CHA2DS2-VASc Score = 5   This indicates a 7.2% annual risk of stroke. The patient's score is based upon: CHF History: 0 HTN History: 1 Diabetes History: 0 Stroke History: 0 Vascular Disease History: 1 Age Score:  2 Gender Score: 1           Physical Exam:    VS:  BP 104/60 (BP Location: Left Arm, Patient Position: Sitting, Cuff Size: Normal)   Pulse (!) 53   Resp 16   Ht 5' 6 (1.676 m)   Wt 123 lb 9.6  oz (56.1 kg)   SpO2 99%   BMI 19.95 kg/m     Wt Readings from Last 3 Encounters:  08/29/24 123 lb 9.6 oz (56.1 kg)  06/09/24 122 lb 6.4 oz (55.5 kg)  02/29/24 126 lb 3.2 oz (57.2 kg)    Gen: No distress Neck: No JVD, R subclavian bruit Cardiac: No Rubs or Gallops, no murmur today, normal rhythm +2 radial pulses Respiratory: Clear to auscultation bilaterally, normal effort, normal  respiratory rate GI: Soft, nontender, non-distended  MS: No  edema;  moves all extremities Integument: Skin feels warm, has R arm scar Neuro:  At time of evaluation, alert and oriented to person/place/time/situation  Psych: Normal affect, patient feels well   ASSESSMENT/PLAN:    Paroxysmal atrial fibrillation Currently well-controlled with Eliquis . No recent episodes of symptomatic atrial fibrillation. Heart rate is slightly low, not concerning given aortic dissection history. A third of patients with AFib are symptomatic, but she is asymptomatic. - Continue Eliquis  - we discussed given her fatigue trial of BB (see below)  Aortic dissection, post-repair Post-repair status with no evidence of aortic aneurysm. Previous imaging shows stable repair with no abnormal flow issues. Variability in aortic measurements attributed to imaging technique rather than true anatomical changes. Importance of consistent imaging at the same facility discussed.  We reviewed the current aortopathy guidelines - Continue monitoring with echocardiograms in the summer - CT [facility] are performed at [facility] for consistency in imaging  Mitral annular disjunction Barlow's Valve Mild MR Mitral annular disjunction noted on previous echocardiograms.  - Continue monitoring with echocardiograms (cadence driving by prior dissection)  Mild  non-obstructive coronary artery disease Presence of mild non-obstructive coronary artery disease noted.  No sx, LDL goal under <55  Bradycardia secondary to metoprolol Bradycardia likely secondary to metoprolol use. Heart rate is low, not concerning given aortic dissection history. Discussed potential benefits of discontinuing metoprolol to see if symptoms improve. Explained that if symptoms worsen or heart rate increases significantly, metoprolol can be reintroduced. - Discontinue metoprolol - Monitor symptoms and heart rate - Reintroduce metoprolol if symptoms worsen or heart rate increases significantly  Longitudinal care: The evaluation and management services provided today reflect the complexity inherent in caring for this patient, including the ongoing longitudinal relationship and management of multiple chronic conditions and/or the need for care coordination. The visit required a comprehensive assessment and management plan tailored to the patient's unique needs Time was spent addressing not only the acute concerns but also the broader context of the patient's health, including preventive care, chronic disease management, and care coordination as appropriate.  Complex longitudinal is necessary for conditions including: we diagnosed her dissection and have been part of her post dissection care; reviewing the social and emotional aspects of this care as well   One year with me or my team  Stanly Leavens, MD FASE [facility]  [address] [place], [place] [address] [phone]  4:59 PM

## 2024-08-29 NOTE — Patient Instructions (Signed)
 Medication Instructions:  Your physician has recommended you make the following change in your medication:  STOP: metoprolol   *If you need a refill on your cardiac medications before your next appointment, please call your pharmacy*  Lab Work: NONE  If you have labs (blood work) drawn today and your tests are completely normal, you will receive your results only by: MyChart Message (if you have MyChart) OR A paper copy in the mail If you have any lab test that is abnormal or we need to change your treatment, we will call you to review the results.  Testing/Procedures: MAY 2026- - - Your physician has requested that you have an echocardiogram. Echocardiography is a painless test that uses sound waves to create images of your heart. It provides your doctor with information about the size and shape of your heart and how well your heart's chambers and valves are working. This procedure takes approximately one hour. There are no restrictions for this procedure. Please do NOT wear cologne, perfume, aftershave, or lotions (deodorant is allowed). Please arrive 15 minutes prior to your appointment time.  Please note: We ask at that you not bring children with you during ultrasound (echo/ vascular) testing. Due to room size and safety concerns, children are not allowed in the ultrasound rooms during exams. Our front office staff cannot provide observation of children in our lobby area while testing is being conducted. An adult accompanying a patient to their appointment will only be allowed in the ultrasound room at the discretion of the ultrasound technician under special circumstances. We apologize for any inconvenience.   Follow-Up: At [facility], you and your health needs are our priority.  As part of our continuing mission to provide you with exceptional heart care, our providers are all part of one team.  This team includes your primary Cardiologist (physician) and Advanced Practice  Providers or APPs (Physician Assistants and Nurse Practitioners) who all work together to provide you with the care you need, when you need it.  Your next appointment:   1 year(s)  Provider:   Stanly DELENA Leavens, MD

## 2024-09-05 ENCOUNTER — Encounter: Payer: Self-pay | Admitting: Gastroenterology

## 2024-09-11 ENCOUNTER — Encounter (HOSPITAL_COMMUNITY): Payer: Self-pay

## 2024-09-11 ENCOUNTER — Emergency Department (HOSPITAL_COMMUNITY)
Admission: EM | Admit: 2024-09-11 | Discharge: 2024-09-11 | Disposition: A | Discharge status: Home / Self Care | Attending: Emergency Medicine | Admitting: Emergency Medicine

## 2024-09-11 ENCOUNTER — Other Ambulatory Visit: Payer: Self-pay

## 2024-09-11 ENCOUNTER — Emergency Department (HOSPITAL_COMMUNITY)

## 2024-09-11 DIAGNOSIS — W1830XA Fall on same level, unspecified, initial encounter: Secondary | ICD-10-CM | POA: Diagnosis not present

## 2024-09-11 DIAGNOSIS — I10 Essential (primary) hypertension: Secondary | ICD-10-CM | POA: Diagnosis not present

## 2024-09-11 DIAGNOSIS — Y93K1 Activity, walking an animal: Secondary | ICD-10-CM | POA: Diagnosis not present

## 2024-09-11 DIAGNOSIS — I6782 Cerebral ischemia: Secondary | ICD-10-CM | POA: Diagnosis not present

## 2024-09-11 DIAGNOSIS — S0990XA Unspecified injury of head, initial encounter: Secondary | ICD-10-CM

## 2024-09-11 DIAGNOSIS — S0003XA Contusion of scalp, initial encounter: Secondary | ICD-10-CM | POA: Insufficient documentation

## 2024-09-11 DIAGNOSIS — Z7901 Long term (current) use of anticoagulants: Secondary | ICD-10-CM | POA: Diagnosis not present

## 2024-09-11 DIAGNOSIS — I672 Cerebral atherosclerosis: Secondary | ICD-10-CM | POA: Diagnosis not present

## 2024-09-11 DIAGNOSIS — R58 Hemorrhage, not elsewhere classified: Secondary | ICD-10-CM | POA: Diagnosis not present

## 2024-09-11 DIAGNOSIS — W19XXXA Unspecified fall, initial encounter: Secondary | ICD-10-CM

## 2024-09-11 DIAGNOSIS — G319 Degenerative disease of nervous system, unspecified: Secondary | ICD-10-CM | POA: Diagnosis not present

## 2024-09-11 NOTE — Discharge Instructions (Signed)
 The x-rays did not show any signs of serious injury.  You can take over-the-counter medication such as Tylenol .  You can also apply ice to help with swelling.  Return to the emergency room for severe headache confusion or other concerning symptoms

## 2024-09-11 NOTE — ED Triage Notes (Signed)
 Pt BIB [facility] due to fall. Pt AAOx4. Pt was walking her dogs when she was yanked by collar and fell backwards hiting her head. NO LOC, + Blood thinner (eliquis ). Denies N/V or chest pain.

## 2024-09-11 NOTE — Progress Notes (Signed)
   09/11/24 1900  Spiritual Encounters  Type of Visit Initial  Care provided to: Pt not available  Referral source Trauma page  Reason for visit Trauma  OnCall Visit No   Chaplain responded to a level two trauma.  If a chaplain is requested someone will respond.   Carley [facility]  [phone]

## 2024-09-11 NOTE — ED Provider Notes (Signed)
  EMERGENCY DEPARTMENT AT [facility] Provider Note   CSN: [id number] Arrival date & time: 09/11/24  1842     Patient presents with: [place]   Kristine Davidson is a 73 y.o. female.    Fall     Patient has a history of mitral valve prolapse hypertension osteoporosis thoracic aortic disease, chronic anticoagulation use.  Patient presented to the ED after a fall.  Patient states she was walking her dog when she was yanked by the dog and she fell backwards hitting the back of her head.  Patient did not lose consciousness.  She is not having any other complaints of chest pain or extremity pain.  She was able to walk without difficulty.  She did not have any shortness of breath.  No numbness or weakness.  Prior to Admission medications   Medication Sig Start Date End Date Taking? Authorizing Provider  acetaminophen  (TYLENOL ) 325 MG tablet Take 650 mg by mouth as needed for pain. Nightly Prn    [provider]  budesonide -formoterol  (SYMBICORT ) 80-4.5 MCG/ACT inhaler Inhale 2 puffs into the lungs in the morning and at bedtime. 11/25/23   Kassie Acquanetta Bradley, MD  buPROPion  (WELLBUTRIN  XL) 300 MG 24 hr tablet Take 300 mg by mouth daily. 10/21/22   [provider]  Cholecalciferol (VITAMIN D ) 50 MCG (2000 UT) CAPS Take 1 capsule by mouth daily. 05/21/24   [provider]  Coenzyme Q10 (COQ10) 100 MG CAPS Take 1 tablet by mouth daily. 05/21/24   [provider]  ELIQUIS  2.5 MG TABS tablet Take 2.5 mg by mouth 2 (two) times daily. 11/13/21   [provider]  ezetimibe  (ZETIA ) 10 MG tablet Take 1 tablet (10 mg total) by mouth daily. 02/29/24 08/29/24  Parthenia Olivia HERO, PA-C  levothyroxine  (SYNTHROID ) 50 MCG tablet TAKE 1 TABLET (50 MCG TOTAL) BY MOUTH DAILY. 12/07/19   Levora Reyes SAUNDERS, MD  MAGNESIUM CITRATE PO Take 250 mg by mouth daily at 6 (six) AM.    [provider]  PARoxetine  (PAXIL ) 30 MG tablet Take by mouth daily.    [provider]  polyethylene glycol (MIRALAX  / GLYCOLAX ) 17 g packet at bedtime.    [provider]  rosuvastatin  (CRESTOR ) 20 MG tablet Take 20 mg by mouth daily. 07/27/23   [provider]  traZODone  (DESYREL ) 50 MG tablet Take 1-2 tablets (50-100 mg total) by mouth at bedtime. 12/07/19   Levora Reyes SAUNDERS, MD  valACYclovir  (VALTREX ) 1000 MG tablet Take 500 mg by mouth daily. 07/27/23   [provider]    Allergies: Quinolones, Codeine, and Nickel    Review of Systems  Updated Vital Signs BP 133/71   Pulse (!) 55   Temp 98.8 F (37.1 C) (Oral)   Resp 16   Ht 1.676 m (5' 6)   Wt 54.4 kg   SpO2 97%   BMI 19.37 kg/m   Physical Exam Vitals and nursing note reviewed.  Constitutional:      General: She is not in acute distress.    Appearance: She is well-developed.  HENT:     Head: Normocephalic.     Comments: Small contusion noted posterior occiput    Right Ear: External ear normal.     Left Ear: External ear normal.  Eyes:     General: No scleral icterus.       Right eye: No discharge.        Left eye: No discharge.  Conjunctiva/sclera: Conjunctivae normal.  Neck:     Trachea: No tracheal deviation.  Cardiovascular:     Rate and Rhythm: Normal rate and regular rhythm.  Pulmonary:     Effort: Pulmonary effort is normal. No respiratory distress.     Breath sounds: Normal breath sounds. No stridor. No wheezing or rales.  Abdominal:     General: Bowel sounds are normal. There is no distension.     Palpations: Abdomen is soft.     Tenderness: There is no abdominal tenderness. There is no guarding or rebound.  Musculoskeletal:        General: No tenderness or deformity.     Cervical back: Normal and neck supple.     Thoracic back: Normal.     Lumbar back: Normal.     Comments: No tenderness palpation bilateral upper extremities lower extremities  Skin:    General: Skin is warm and dry.     Findings: No rash.  Neurological:     General: No  focal deficit present.     Mental Status: She is alert.     Cranial Nerves: No cranial nerve deficit, dysarthria or facial asymmetry.     Sensory: No sensory deficit.     Motor: No abnormal muscle tone or seizure activity.     Coordination: Coordination normal.  Psychiatric:        Mood and Affect: Mood normal.     (all labs ordered are listed, but only abnormal results are displayed) Labs Reviewed - No data to display  EKG: None  Radiology: CT Head Wo Contrast Result Date: 09/11/2024 EXAM: CT HEAD AND CERVICAL SPINE 09/11/2024 07:05:23 PM TECHNIQUE: CT of the head and cervical spine was performed without the administration of intravenous contrast. Multiplanar reformatted images are provided for review. Automated exposure control, iterative reconstruction, and/or weight based adjustment of the mA/kV was utilized to reduce the radiation dose to as low as reasonably achievable. COMPARISON: None available. CLINICAL HISTORY: Fall, head injury, on eliquis . Pt BIB [facility] due to fall. Pt AAOx4. Pt was walking her dogs when she was yanked by collar and fell backwards hitting her head. NO LOC, + Blood thinner (eliquis ). Denies N/V or chest pain. FINDINGS: CT HEAD BRAIN AND VENTRICLES: No acute intracranial hemorrhage. No mass effect or midline shift. No abnormal extra-axial fluid collection. No evidence of acute infarct. No hydrocephalus. Mild cortical atrophy. Subcortical and periventricular small vessel ischemic changes. ORBITS: No acute abnormality. SINUSES AND MASTOIDS: No acute abnormality. SOFT TISSUES AND SKULL: No acute skull fracture. No acute soft tissue abnormality. CT CERVICAL SPINE BONES AND ALIGNMENT: No acute fracture or traumatic malalignment. DEGENERATIVE CHANGES: Mild degenerative changes of the mid / lower cervical spine, most prominent at C5-6 and C6-7. SOFT TISSUES: No prevertebral soft tissue swelling. VASCULATURE: Intracranial atherosclerosis. IMPRESSION: 1. No acute intracranial  abnormality. 2. No acute traumatic injury to the cervical spine. Electronically signed by: Pinkie Pebbles MD 09/11/2024 07:26 PM EDT RP Workstation: [id number]   CT Cervical Spine Wo Contrast Result Date: 09/11/2024 EXAM: CT HEAD AND CERVICAL SPINE 09/11/2024 07:05:23 PM TECHNIQUE: CT of the head and cervical spine was performed without the administration of intravenous contrast. Multiplanar reformatted images are provided for review. Automated exposure control, iterative reconstruction, and/or weight based adjustment of the mA/kV was utilized to reduce the radiation dose to as low as reasonably achievable. COMPARISON: None available. CLINICAL HISTORY: Fall, head injury, on eliquis . Pt BIB [facility] due to fall. Pt AAOx4. Pt was walking her dogs when she was  yanked by collar and fell backwards hitting her head. NO LOC, + Blood thinner (eliquis ). Denies N/V or chest pain. FINDINGS: CT HEAD BRAIN AND VENTRICLES: No acute intracranial hemorrhage. No mass effect or midline shift. No abnormal extra-axial fluid collection. No evidence of acute infarct. No hydrocephalus. Mild cortical atrophy. Subcortical and periventricular small vessel ischemic changes. ORBITS: No acute abnormality. SINUSES AND MASTOIDS: No acute abnormality. SOFT TISSUES AND SKULL: No acute skull fracture. No acute soft tissue abnormality. CT CERVICAL SPINE BONES AND ALIGNMENT: No acute fracture or traumatic malalignment. DEGENERATIVE CHANGES: Mild degenerative changes of the mid / lower cervical spine, most prominent at C5-6 and C6-7. SOFT TISSUES: No prevertebral soft tissue swelling. VASCULATURE: Intracranial atherosclerosis. IMPRESSION: 1. No acute intracranial abnormality. 2. No acute traumatic injury to the cervical spine. Electronically signed by: Pinkie Pebbles MD 09/11/2024 07:26 PM EDT RP Workstation: [id number]     Procedures   Medications Ordered in the ED - No data to display  Clinical Course as of 09/11/24 2012  Mon Sep 11, 2024  1956 Head CT and C-spine CT without acute findings [JK]    Clinical Course User Index [JK] Randol Simmonds, MD                                 Medical Decision Making Problems Addressed: Fall, initial encounter: acute illness or injury that poses a threat to life or bodily functions Injury of head, initial encounter: acute illness or injury that poses a threat to life or bodily functions  Amount and/or Complexity of Data Reviewed Radiology: ordered.   Patient presented to the ED for mechanical fall.  Patient however is on anticoagulation.  She presented as a level 2 trauma.  Patient denied any other injuries.  There was no scalp laceration.  CT scan fortunately did not show signs of fracture, subdural subarachnoid hemorrhage or other traumatic injury.  Evaluation and diagnostic testing in the emergency department does not suggest an emergent condition requiring admission or immediate intervention beyond what has been performed at this time.  The patient is safe for discharge and has been instructed to return immediately for worsening symptoms, change in symptoms or any other concerns.     Final diagnoses:  Fall, initial encounter  Injury of head, initial encounter    ED Discharge Orders     None          Randol Simmonds, MD 09/11/24 2012

## 2024-09-11 NOTE — ED Notes (Signed)
 Trauma Response Nurse Documentation  Kristine Davidson is a 73 y.o. female arriving to [facility] ED via EMS  On Eliquis  (apixaban ) daily. Trauma was activated as a Level 2 based on the following trauma criteria Elderly patients > 65 with head trauma on anti-coagulation (excluding ASA).  Patient cleared for CT by Dr. Randol. Pt transported to CT with trauma response nurse present to monitor. RN remained with the patient throughout their absence from the department for clinical observation.   GCS 15.  History   Past Medical History:  Diagnosis Date   Allergy    seasonal allergies   Anemia    hx of    Anxiety    on meds   Aortic atherosclerosis    Arthritis    generalized   B12 deficiency 03/31/2012   Depression    on meds--hx of   Diverticular disease    Emphysema of lung (HCC)    HSV-2 (herpes simplex virus 2) infection    Hyperlipidemia    diet controlled   Hypertension    Mitral valve prolapse    Osteoporosis 01/14/2018   [facility]18 T score -2.8 at Rt femur neck and L1-L4 AP spine   Substance abuse (HCC)    Thoracic aortic ectasia    Thyroid  disease    on meds   Tobacco dependence    Vitamin D  deficiency      Past Surgical History:  Procedure Laterality Date   aortic dissection repair  10/2021   COLONOSCOPY     D and C     TONSILLECTOMY     VARICOSE VEIN SURGERY  2000   Vascular Surgery US  upper extremity arterial duplex right         Initial Focused Assessment (If applicable, or please see trauma documentation): Patient A&Ox4, GCS 15, PERR 3 Airway intact, bilateral breath sounds Pulses 2+  CT's Completed:   CT Head and CT C-Spine   Interventions:  CT Head/Cspine  Plan for disposition:  Discharge home   Event Summary: Patient to ED after a fall at home where her dogs tripped her and she fell backwards. Patient takes Eliquis  for Afib, small abrasion to back of head. Imaging was completed and revealed  Bedside handoff with ED RN  [place].    Kristine Davidson Kristine Davidson  Trauma Response RN  Please call TRN at [phone] for further assistance.

## 2024-09-21 DIAGNOSIS — Z23 Encounter for immunization: Secondary | ICD-10-CM | POA: Diagnosis not present

## 2024-10-02 ENCOUNTER — Telehealth: Payer: Self-pay

## 2024-10-02 NOTE — Telephone Encounter (Signed)
 Auth Submission: NO AUTH NEEDED Site of care: Site of care: [facility] Payer: Medicare A/B with [employer] supplement Medication & CPT/J Code(s) submitted: Prolia  (Denosumab ) [id number] Diagnosis Code:  Route of submission (phone, fax, portal):  Phone # Fax # Auth type: Buy/Bill PB Units/visits requested: 60mg  x 1 dose Reference number:  Approval from: 10/02/24 to 01/20/25

## 2024-10-26 ENCOUNTER — Other Ambulatory Visit (HOSPITAL_COMMUNITY): Payer: Self-pay | Admitting: Internal Medicine

## 2024-10-26 ENCOUNTER — Telehealth (HOSPITAL_COMMUNITY): Payer: Self-pay

## 2024-10-26 NOTE — Telephone Encounter (Signed)
 Auth Submission: NO AUTH NEEDED Site of care: Site of care: [facility] Payer: Medicare A/B, [employer] Supplement Medication & CPT/J Code(s) submitted: Prolia  (Denosumab ) [id number] Diagnosis Code: M81.0 Route of submission (phone, fax, portal):  Phone # Fax # Auth type: Buy/Bill HB Units/visits requested: 60mg  q76months Reference number:  Approval from: 10/26/24 to 01/20/25

## 2024-11-21 DIAGNOSIS — Z95828 Presence of other vascular implants and grafts: Secondary | ICD-10-CM | POA: Diagnosis not present

## 2024-11-21 DIAGNOSIS — T82868A Thrombosis of vascular prosthetic devices, implants and grafts, initial encounter: Secondary | ICD-10-CM | POA: Diagnosis not present

## 2024-11-21 DIAGNOSIS — Z8679 Personal history of other diseases of the circulatory system: Secondary | ICD-10-CM | POA: Diagnosis not present

## 2024-11-21 DIAGNOSIS — Z87891 Personal history of nicotine dependence: Secondary | ICD-10-CM | POA: Diagnosis not present

## 2024-11-21 DIAGNOSIS — Z9889 Other specified postprocedural states: Secondary | ICD-10-CM | POA: Diagnosis not present

## 2024-11-21 DIAGNOSIS — I7776 Dissection of artery of upper extremity: Secondary | ICD-10-CM | POA: Diagnosis not present

## 2024-12-11 ENCOUNTER — Ambulatory Visit (HOSPITAL_COMMUNITY)
Admission: RE | Admit: 2024-12-11 | Discharge: 2024-12-11 | Disposition: A | Discharge status: Home / Self Care | Source: Ambulatory Visit | Attending: Internal Medicine | Admitting: Internal Medicine

## 2024-12-11 VITALS — BP 136/53 | HR 55 | Temp 97.3°F

## 2024-12-11 DIAGNOSIS — M810 Age-related osteoporosis without current pathological fracture: Secondary | ICD-10-CM | POA: Diagnosis present

## 2024-12-11 MED ORDER — DENOSUMAB 60 MG/ML ~~LOC~~ SOSY
60.0000 mg | PREFILLED_SYRINGE | Freq: Once | SUBCUTANEOUS | Status: AC
  Administered 2024-12-11: 60 mg via SUBCUTANEOUS

## 2024-12-11 MED ORDER — DENOSUMAB 60 MG/ML ~~LOC~~ SOSY
PREFILLED_SYRINGE | SUBCUTANEOUS | Status: AC
  Filled 2024-12-11: qty 1

## 2025-01-07 ENCOUNTER — Other Ambulatory Visit: Payer: Self-pay | Admitting: Physician Assistant

## 2025-01-09 ENCOUNTER — Telehealth: Payer: Self-pay | Admitting: Internal Medicine

## 2025-01-09 NOTE — Telephone Encounter (Signed)
 Pt states she tested positive for Covid today. She  and her PCP would like to know if she should take a antiviral medication. Please advise

## 2025-01-09 NOTE — Telephone Encounter (Signed)
 Called pt advised of MD response.  Pt reports provider saw today was reluctant to order medication.  Pt reports will not take medication.  Reports as of right now has a really bad sore throat and low energy level.  Advised pt to call back if has further questions or concerns; pt expresses understanding.

## 2025-01-17 NOTE — Telephone Encounter (Signed)
 Cardiovascular: Hyperlipidemia - ezetimibe  & ezetimibe  / simvastatin Failed01/21/2026 10:54 AM  Protocol Details ALT in normal range and within 365 days   AST in normal range and within 365 days   Lipid Panel within 12 months    Last Lipid Panel 09/27/23 all in normal range Last ALT and AST 10/27/23 all in normal range

## 2025-05-02 ENCOUNTER — Other Ambulatory Visit (HOSPITAL_COMMUNITY)

## 2025-06-12 ENCOUNTER — Encounter (HOSPITAL_COMMUNITY)
